# Patient Record
Sex: Female | Born: 1950 | Race: Asian | Hispanic: No | Smoking: Former smoker
Health system: Southern US, Community
[De-identification: ages and names within clinical notes are randomized; demographics above are authoritative.]

## PROBLEM LIST (undated history)

## (undated) DIAGNOSIS — I10 Essential (primary) hypertension: Secondary | ICD-10-CM

## (undated) DIAGNOSIS — F17210 Nicotine dependence, cigarettes, uncomplicated: Secondary | ICD-10-CM

## (undated) DIAGNOSIS — I25119 Atherosclerotic heart disease of native coronary artery with unspecified angina pectoris: Secondary | ICD-10-CM

## (undated) DIAGNOSIS — E785 Hyperlipidemia, unspecified: Secondary | ICD-10-CM

## (undated) HISTORY — DX: Hyperlipidemia, unspecified: E78.5

## (undated) HISTORY — DX: Nicotine dependence, cigarettes, uncomplicated: F17.210

## (undated) HISTORY — DX: Essential (primary) hypertension: I10

## (undated) HISTORY — DX: Atherosclerotic heart disease of native coronary artery with unspecified angina pectoris: I25.119

---

## 1999-05-01 ENCOUNTER — Other Ambulatory Visit: Admission: RE | Admit: 1999-05-01 | Discharge: 1999-05-01 | Payer: Self-pay | Admitting: Physician Assistant

## 2001-08-18 ENCOUNTER — Other Ambulatory Visit: Admission: RE | Admit: 2001-08-18 | Discharge: 2001-08-18 | Payer: Self-pay | Admitting: Obstetrics and Gynecology

## 2001-11-17 ENCOUNTER — Emergency Department (HOSPITAL_COMMUNITY): Admission: EM | Admit: 2001-11-17 | Discharge: 2001-11-17 | Payer: Self-pay | Admitting: Emergency Medicine

## 2001-11-17 ENCOUNTER — Encounter: Payer: Self-pay | Admitting: Emergency Medicine

## 2003-02-12 ENCOUNTER — Other Ambulatory Visit: Admission: RE | Admit: 2003-02-12 | Discharge: 2003-02-12 | Payer: Self-pay | Admitting: Obstetrics and Gynecology

## 2004-03-05 ENCOUNTER — Other Ambulatory Visit: Admission: RE | Admit: 2004-03-05 | Discharge: 2004-03-05 | Payer: Self-pay | Admitting: Obstetrics and Gynecology

## 2005-04-02 ENCOUNTER — Other Ambulatory Visit: Admission: RE | Admit: 2005-04-02 | Discharge: 2005-04-02 | Payer: Self-pay | Admitting: Obstetrics and Gynecology

## 2009-05-11 ENCOUNTER — Emergency Department (HOSPITAL_COMMUNITY): Admission: EM | Admit: 2009-05-11 | Discharge: 2009-05-11 | Payer: Self-pay | Admitting: Emergency Medicine

## 2010-06-19 LAB — CBC
HCT: 43 % (ref 36.0–46.0)
Hemoglobin: 13.7 g/dL (ref 12.0–15.0)
MCHC: 31.8 g/dL (ref 30.0–36.0)
MCV: 74.2 fL — ABNORMAL LOW (ref 78.0–100.0)
Platelets: 287 10*3/uL (ref 150–400)
RBC: 5.8 MIL/uL — ABNORMAL HIGH (ref 3.87–5.11)
RDW: 14.4 % (ref 11.5–15.5)
WBC: 11.6 10*3/uL — ABNORMAL HIGH (ref 4.0–10.5)

## 2010-06-19 LAB — CULTURE, BLOOD (ROUTINE X 2): Culture: NO GROWTH

## 2010-06-19 LAB — BASIC METABOLIC PANEL
BUN: 13 mg/dL (ref 6–23)
CO2: 24 mEq/L (ref 19–32)
Calcium: 9.5 mg/dL (ref 8.4–10.5)
Chloride: 106 mEq/L (ref 96–112)
Creatinine, Ser: 0.66 mg/dL (ref 0.4–1.2)
GFR calc Af Amer: >60 mL/min (ref >60)
GFR calc non Af Amer: >60 mL/min (ref >60)
Glucose, Bld: 143 mg/dL — ABNORMAL HIGH (ref 70–99)
Potassium: 4.3 mEq/L (ref 3.5–5.1)
Sodium: 138 mEq/L (ref 135–145)

## 2010-06-19 LAB — POCT CARDIAC MARKERS
CKMB, poc: <1 ng/mL — ABNORMAL LOW (ref 1.0–8.0)
Myoglobin, poc: 61.8 ng/mL (ref 12–200)
Troponin i, poc: <0.05 ng/mL (ref 0.00–0.09)

## 2010-06-19 LAB — DIFFERENTIAL
Eosinophils Absolute: 0.3 10*3/uL (ref 0.0–0.7)
Lymphocytes Relative: 15 % (ref 12–46)
Lymphs Abs: 1.7 10*3/uL (ref 0.7–4.0)
Monocytes Relative: 6 % (ref 3–12)
Neutrophils Relative %: 76 % (ref 43–77)

## 2012-07-30 ENCOUNTER — Emergency Department (HOSPITAL_COMMUNITY): Payer: 59

## 2012-07-30 ENCOUNTER — Emergency Department (HOSPITAL_COMMUNITY)
Admission: EM | Admit: 2012-07-30 | Discharge: 2012-07-30 | Disposition: A | Discharge status: Home / Self Care | Payer: 59 | Attending: Emergency Medicine | Admitting: Emergency Medicine

## 2012-07-30 ENCOUNTER — Encounter (HOSPITAL_COMMUNITY): Payer: Self-pay | Admitting: Emergency Medicine

## 2012-07-30 DIAGNOSIS — I10 Essential (primary) hypertension: Secondary | ICD-10-CM | POA: Insufficient documentation

## 2012-07-30 DIAGNOSIS — Y9389 Activity, other specified: Secondary | ICD-10-CM | POA: Diagnosis not present

## 2012-07-30 DIAGNOSIS — IMO0002 Reserved for concepts with insufficient information to code with codable children: Secondary | ICD-10-CM | POA: Diagnosis not present

## 2012-07-30 DIAGNOSIS — N644 Mastodynia: Secondary | ICD-10-CM

## 2012-07-30 DIAGNOSIS — R911 Solitary pulmonary nodule: Secondary | ICD-10-CM | POA: Diagnosis not present

## 2012-07-30 DIAGNOSIS — S298XXA Other specified injuries of thorax, initial encounter: Secondary | ICD-10-CM | POA: Diagnosis present

## 2012-07-30 DIAGNOSIS — Z79899 Other long term (current) drug therapy: Secondary | ICD-10-CM | POA: Diagnosis not present

## 2012-07-30 DIAGNOSIS — Y92410 Unspecified street and highway as the place of occurrence of the external cause: Secondary | ICD-10-CM | POA: Diagnosis not present

## 2012-07-30 LAB — COMPREHENSIVE METABOLIC PANEL
ALT: 17 U/L (ref 0–35)
AST: 24 U/L (ref 0–37)
Alkaline Phosphatase: 80 U/L (ref 39–117)
CO2: 26 mEq/L (ref 19–32)
Chloride: 102 mEq/L (ref 96–112)
GFR calc Af Amer: 78 mL/min — ABNORMAL LOW (ref >90)
GFR calc non Af Amer: 67 mL/min — ABNORMAL LOW (ref >90)
Glucose, Bld: 184 mg/dL — ABNORMAL HIGH (ref 70–99)
Potassium: 3.6 mEq/L (ref 3.5–5.1)
Sodium: 138 mEq/L (ref 135–145)

## 2012-07-30 LAB — CBC WITH DIFFERENTIAL/PLATELET
Basophils Absolute: 0.1 10*3/uL (ref 0.0–0.1)
Eosinophils Relative: 3 % (ref 0–5)
Lymphocytes Relative: 17 % (ref 12–46)
Lymphs Abs: 1.6 10*3/uL (ref 0.7–4.0)
MCV: 69.5 fL — ABNORMAL LOW (ref 78.0–100.0)
Neutro Abs: 6.8 10*3/uL (ref 1.7–7.7)
Neutrophils Relative %: 71 % (ref 43–77)
Platelets: 326 10*3/uL (ref 150–400)
RBC: 5.8 MIL/uL — ABNORMAL HIGH (ref 3.87–5.11)
RDW: 14.2 % (ref 11.5–15.5)
WBC: 9.5 10*3/uL (ref 4.0–10.5)

## 2012-07-30 LAB — TROPONIN I: Troponin I: <0.3 ng/mL (ref <0.30)

## 2012-07-30 MED ORDER — DIAZEPAM 5 MG PO TABS: 5.0000 mg | ORAL_TABLET | Freq: Four times a day (QID) | ORAL | Status: DC | PRN

## 2012-07-30 MED ORDER — OXYCODONE-ACETAMINOPHEN 5-325 MG PO TABS: 2.0000 | ORAL_TABLET | ORAL | Status: DC | PRN

## 2012-07-30 MED ORDER — DIAZEPAM 5 MG/ML IJ SOLN
5.0000 mg | Freq: Once | INTRAMUSCULAR | Status: AC
  Administered 2012-07-30: 5 mg via INTRAVENOUS
  Filled 2012-07-30: qty 2

## 2012-07-30 MED ORDER — IBUPROFEN 800 MG PO TABS: 800.0000 mg | ORAL_TABLET | Freq: Three times a day (TID) | ORAL | Status: DC

## 2012-07-30 MED ORDER — SODIUM CHLORIDE 0.9 % IV BOLUS (SEPSIS)
1000.0000 mL | Freq: Once | INTRAVENOUS | Status: AC
  Administered 2012-07-30: 1000 mL via INTRAVENOUS

## 2012-07-30 MED ORDER — HYDROMORPHONE HCL PF 1 MG/ML IJ SOLN
1.0000 mg | Freq: Once | INTRAMUSCULAR | Status: AC
  Administered 2012-07-30: 1 mg via INTRAVENOUS
  Filled 2012-07-30: qty 1

## 2012-07-30 MED ORDER — ONDANSETRON HCL 4 MG/2ML IJ SOLN
4.0000 mg | Freq: Once | INTRAMUSCULAR | Status: AC
  Administered 2012-07-30: 4 mg via INTRAVENOUS
  Filled 2012-07-30: qty 2

## 2012-07-30 MED ORDER — MORPHINE SULFATE 4 MG/ML IJ SOLN
4.0000 mg | Freq: Once | INTRAMUSCULAR | Status: AC
  Administered 2012-07-30: 4 mg via INTRAVENOUS
  Filled 2012-07-30: qty 1

## 2012-07-30 MED ORDER — IOHEXOL 350 MG/ML SOLN
100.0000 mL | Freq: Once | INTRAVENOUS | Status: AC | PRN
  Administered 2012-07-30: 100 mL via INTRAVENOUS
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
## 2012-07-30 NOTE — ED Notes (Signed)
Discharge instructions reviewed. Pt verbalized understanding.  

## 2012-08-23 ENCOUNTER — Emergency Department (HOSPITAL_COMMUNITY)
Admission: EM | Admit: 2012-08-23 | Discharge: 2012-08-23 | Disposition: A | Discharge status: Home / Self Care | Payer: 59 | Attending: Emergency Medicine | Admitting: Emergency Medicine

## 2012-08-23 ENCOUNTER — Emergency Department (HOSPITAL_COMMUNITY): Payer: 59

## 2012-08-23 ENCOUNTER — Encounter (HOSPITAL_COMMUNITY): Payer: Self-pay | Admitting: Emergency Medicine

## 2012-08-23 DIAGNOSIS — G8911 Acute pain due to trauma: Secondary | ICD-10-CM | POA: Insufficient documentation

## 2012-08-23 DIAGNOSIS — R0789 Other chest pain: Secondary | ICD-10-CM

## 2012-08-23 DIAGNOSIS — R071 Chest pain on breathing: Secondary | ICD-10-CM | POA: Insufficient documentation

## 2012-08-23 DIAGNOSIS — I10 Essential (primary) hypertension: Secondary | ICD-10-CM | POA: Insufficient documentation

## 2012-08-23 DIAGNOSIS — F17200 Nicotine dependence, unspecified, uncomplicated: Secondary | ICD-10-CM | POA: Insufficient documentation

## 2012-08-23 DIAGNOSIS — R079 Chest pain, unspecified: Secondary | ICD-10-CM | POA: Diagnosis present

## 2012-08-23 DIAGNOSIS — Z79899 Other long term (current) drug therapy: Secondary | ICD-10-CM | POA: Insufficient documentation

## 2012-08-23 LAB — POCT I-STAT TROPONIN I: Troponin i, poc: 0 ng/mL (ref 0.00–0.08)

## 2012-08-23 MED ORDER — DICLOFENAC SODIUM 75 MG PO TBEC: 75.0000 mg | DELAYED_RELEASE_TABLET | Freq: Two times a day (BID) | ORAL | Status: DC

## 2012-08-23 MED ORDER — TRAMADOL HCL 50 MG PO TABS: 50.0000 mg | ORAL_TABLET | Freq: Four times a day (QID) | ORAL | Status: DC | PRN

## 2012-08-23 MED ORDER — NITROGLYCERIN 0.4 MG SL SUBL: 0.4000 mg | SUBLINGUAL_TABLET | SUBLINGUAL | Status: DC | PRN

## 2012-08-23 MED ORDER — ASPIRIN 325 MG PO TABS: 325.0000 mg | ORAL_TABLET | ORAL | Status: DC

## 2012-08-23 NOTE — ED Notes (Signed)
Patient stated she takes bp meds at home.   She advised "it goes up when I get nervous".
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
## 2014-04-23 ENCOUNTER — Ambulatory Visit: Payer: Self-pay | Admitting: Internal Medicine

## 2014-04-23 NOTE — Progress Notes (Signed)
Patient ID: Anthoney HaradaLetecia Georg, female   DOB: 12-30-1950, 64 y.o.   MRN: [id number]  [name]  O      S  H  O  W

## 2017-08-27 DIAGNOSIS — I25119 Atherosclerotic heart disease of native coronary artery with unspecified angina pectoris: Secondary | ICD-10-CM

## 2017-08-27 HISTORY — DX: Atherosclerotic heart disease of native coronary artery with unspecified angina pectoris: I25.119

## 2017-09-12 ENCOUNTER — Other Ambulatory Visit: Payer: Self-pay

## 2017-09-12 ENCOUNTER — Emergency Department (HOSPITAL_COMMUNITY): Payer: 59

## 2017-09-12 ENCOUNTER — Encounter (HOSPITAL_COMMUNITY): Payer: Self-pay | Admitting: *Deleted

## 2017-09-12 ENCOUNTER — Inpatient Hospital Stay (HOSPITAL_COMMUNITY)
Admission: EM | Admit: 2017-09-12 | Discharge: 2017-09-15 | DRG: 247 | Disposition: A | Discharge status: Home / Self Care | Payer: 59 | Attending: Family Medicine | Admitting: Family Medicine

## 2017-09-12 DIAGNOSIS — I2510 Atherosclerotic heart disease of native coronary artery without angina pectoris: Secondary | ICD-10-CM | POA: Diagnosis present

## 2017-09-12 DIAGNOSIS — W208XXA Other cause of strike by thrown, projected or falling object, initial encounter: Secondary | ICD-10-CM | POA: Diagnosis present

## 2017-09-12 DIAGNOSIS — I25119 Atherosclerotic heart disease of native coronary artery with unspecified angina pectoris: Secondary | ICD-10-CM | POA: Clinically undetermined

## 2017-09-12 DIAGNOSIS — Y9289 Other specified places as the place of occurrence of the external cause: Secondary | ICD-10-CM

## 2017-09-12 DIAGNOSIS — I200 Unstable angina: Secondary | ICD-10-CM | POA: Diagnosis present

## 2017-09-12 DIAGNOSIS — I161 Hypertensive emergency: Secondary | ICD-10-CM | POA: Diagnosis present

## 2017-09-12 DIAGNOSIS — R079 Chest pain, unspecified: Secondary | ICD-10-CM | POA: Diagnosis present

## 2017-09-12 DIAGNOSIS — I10 Essential (primary) hypertension: Secondary | ICD-10-CM | POA: Diagnosis present

## 2017-09-12 DIAGNOSIS — E7849 Other hyperlipidemia: Secondary | ICD-10-CM | POA: Diagnosis present

## 2017-09-12 DIAGNOSIS — Z87891 Personal history of nicotine dependence: Secondary | ICD-10-CM | POA: Diagnosis present

## 2017-09-12 DIAGNOSIS — E785 Hyperlipidemia, unspecified: Secondary | ICD-10-CM | POA: Diagnosis present

## 2017-09-12 DIAGNOSIS — Z955 Presence of coronary angioplasty implant and graft: Secondary | ICD-10-CM

## 2017-09-12 DIAGNOSIS — R9439 Abnormal result of other cardiovascular function study: Secondary | ICD-10-CM | POA: Clinically undetermined

## 2017-09-12 DIAGNOSIS — F17210 Nicotine dependence, cigarettes, uncomplicated: Secondary | ICD-10-CM | POA: Diagnosis present

## 2017-09-12 DIAGNOSIS — I25110 Atherosclerotic heart disease of native coronary artery with unstable angina pectoris: Secondary | ICD-10-CM | POA: Diagnosis not present

## 2017-09-12 DIAGNOSIS — I4510 Unspecified right bundle-branch block: Secondary | ICD-10-CM | POA: Diagnosis present

## 2017-09-12 DIAGNOSIS — R911 Solitary pulmonary nodule: Secondary | ICD-10-CM | POA: Diagnosis present

## 2017-09-12 DIAGNOSIS — S0990XA Unspecified injury of head, initial encounter: Secondary | ICD-10-CM | POA: Diagnosis present

## 2017-09-12 LAB — BASIC METABOLIC PANEL
ANION GAP: 11 (ref 5–15)
BUN: 20 mg/dL (ref 6–20)
CHLORIDE: 102 mmol/L (ref 101–111)
CO2: 22 mmol/L (ref 22–32)
Calcium: 10.3 mg/dL (ref 8.9–10.3)
Creatinine, Ser: 0.87 mg/dL (ref 0.44–1.00)
GFR calc Af Amer: >60 mL/min (ref >60)
GFR calc non Af Amer: >60 mL/min (ref >60)
Glucose, Bld: 157 mg/dL — ABNORMAL HIGH (ref 65–99)
POTASSIUM: 4 mmol/L (ref 3.5–5.1)
SODIUM: 135 mmol/L (ref 135–145)

## 2017-09-12 LAB — CBC
HEMATOCRIT: 44.6 % (ref 36.0–46.0)
HEMOGLOBIN: 13.8 g/dL (ref 12.0–15.0)
MCH: 22.7 pg — ABNORMAL LOW (ref 26.0–34.0)
MCHC: 30.9 g/dL (ref 30.0–36.0)
MCV: 73.4 fL — AB (ref 78.0–100.0)
Platelets: 339 10*3/uL (ref 150–400)
RBC: 6.08 MIL/uL — AB (ref 3.87–5.11)
RDW: 14.1 % (ref 11.5–15.5)
WBC: 11.5 10*3/uL — AB (ref 4.0–10.5)

## 2017-09-12 LAB — I-STAT TROPONIN, ED
TROPONIN I, POC: 0.01 ng/mL (ref 0.00–0.08)
Troponin i, poc: 0 ng/mL (ref 0.00–0.08)

## 2017-09-12 LAB — I-STAT VENOUS BLOOD GAS, ED
Bicarbonate: 25.1 mmol/L (ref 20.0–28.0)
O2 Saturation: 81 %
PH VEN: 7.376 (ref 7.250–7.430)
TCO2: 26 mmol/L (ref 22–32)
pCO2, Ven: 42.7 mmHg — ABNORMAL LOW (ref 44.0–60.0)
pO2, Ven: 47 mmHg — ABNORMAL HIGH (ref 32.0–45.0)

## 2017-09-12 LAB — BRAIN NATRIURETIC PEPTIDE: B NATRIURETIC PEPTIDE 5: 50 pg/mL (ref 0.0–100.0)

## 2017-09-12 LAB — I-STAT CHEM 8, ED
BUN: 33 mg/dL — ABNORMAL HIGH (ref 6–20)
BUN: 29 mg/dL — ABNORMAL HIGH (ref 6–20)
CALCIUM ION: 1.31 mmol/L (ref 1.15–1.40)
Calcium, Ion: 1.13 mmol/L — ABNORMAL LOW (ref 1.15–1.40)
Chloride: 106 mmol/L (ref 101–111)
Chloride: 104 mmol/L (ref 101–111)
Creatinine, Ser: 0.8 mg/dL (ref 0.44–1.00)
Creatinine, Ser: 0.8 mg/dL (ref 0.44–1.00)
Glucose, Bld: 156 mg/dL — ABNORMAL HIGH (ref 65–99)
Glucose, Bld: 160 mg/dL — ABNORMAL HIGH (ref 65–99)
HCT: 47 % — ABNORMAL HIGH (ref 36.0–46.0)
HEMATOCRIT: 48 % — AB (ref 36.0–46.0)
HEMOGLOBIN: 16 g/dL — AB (ref 12.0–15.0)
HEMOGLOBIN: 16.3 g/dL — AB (ref 12.0–15.0)
POTASSIUM: 8 mmol/L — AB (ref 3.5–5.1)
Potassium: 4.7 mmol/L (ref 3.5–5.1)
SODIUM: 132 mmol/L — AB (ref 135–145)
SODIUM: 137 mmol/L (ref 135–145)
TCO2: 23 mmol/L (ref 22–32)
TCO2: 25 mmol/L (ref 22–32)

## 2017-09-12 LAB — CBG MONITORING, ED: GLUCOSE-CAPILLARY: 164 mg/dL — AB (ref 65–99)

## 2017-09-12 MED ORDER — SODIUM CHLORIDE 0.9 % IV SOLN
1.0000 g | Freq: Once | INTRAVENOUS | Status: DC
  Filled 2017-09-12: qty 10

## 2017-09-12 MED ORDER — IOPAMIDOL (ISOVUE-370) INJECTION 76%
100.0000 mL | Freq: Once | INTRAVENOUS | Status: AC | PRN
  Administered 2017-09-12: 100 mL via INTRAVENOUS

## 2017-09-12 MED ORDER — MORPHINE SULFATE (PF) 4 MG/ML IV SOLN
4.0000 mg | Freq: Once | INTRAVENOUS | Status: AC
  Administered 2017-09-12: 4 mg via INTRAVENOUS
  Filled 2017-09-12: qty 1

## 2017-09-12 MED ORDER — NITROGLYCERIN IN D5W 200-5 MCG/ML-% IV SOLN
0.0000 ug/min | Freq: Once | INTRAVENOUS | Status: AC
  Administered 2017-09-12: 50 ug/min via INTRAVENOUS
  Filled 2017-09-12: qty 250

## 2017-09-12 MED ORDER — IOPAMIDOL (ISOVUE-370) INJECTION 76%
INTRAVENOUS | Status: AC
  Filled 2017-09-12: qty 100

## 2017-09-12 MED ORDER — FUROSEMIDE 10 MG/ML IJ SOLN
20.0000 mg | Freq: Once | INTRAMUSCULAR | Status: AC
  Administered 2017-09-12: 20 mg via INTRAVENOUS
  Filled 2017-09-12: qty 2

## 2017-09-12 MED ORDER — NITROGLYCERIN 0.4 MG SL SUBL
0.4000 mg | SUBLINGUAL_TABLET | SUBLINGUAL | Status: DC | PRN
  Administered 2017-09-12 (×2): 0.4 mg via SUBLINGUAL
  Filled 2017-09-12: qty 1

## 2017-09-12 NOTE — ED Provider Notes (Signed)
[facility] EMERGENCY DEPARTMENT Provider Note   CSN: [id number] Arrival date & time: 09/12/17  1416  History   Chief Complaint No chief complaint on file.   HPI Ashley Moran is a 67 y.o. female.  The history is provided by the patient.    67 yo F with PMHx of HTN, tobacco abuse who presents with chest pain since waking up at 0400 this AM. Initially described pain as sharp, midsternal, radiating to neck. Took 6 ASA 81mg  at work with no improvement in pain. EMS gave 1 SL ntg with improvement in BP from 220s -> 110s systolic with near resolution of pain. At this time, describes pain as severe, heavy, mid-sternal, radiating to abd. Accompanied by dyspnea, which is worse with exertion. No similar symptoms in the past. Denies recent fevers or worsening cough from baseline. Denies leg swelling. Denies h/o PE/DVT. Denies cardiac history.   Past Medical History:  Diagnosis Date  . Hypertension     Patient Active Problem List   Diagnosis Date Noted  . Chest pain 09/12/2017    History reviewed. No pertinent surgical history.   OB History   None      Home Medications    Prior to Admission medications   Medication Sig Start Date End Date Taking? Authorizing Provider  Aspirin-Caffeine (BAYER BACK & BODY) 500-32.5 MG TABS Take 1 tablet by mouth See admin instructions. Take 1 tablet by mouth in the morning and 2-3 times daily, additionally, as needed for pain   Yes [provider]  lisinopril (PRINIVIL,ZESTRIL) 10 MG tablet Take 10 mg by mouth every evening.   Yes [provider]  Multiple Vitamins-Minerals (HAIR SKIN AND NAILS FORMULA) TABS Take 1 tablet by mouth 2 (two) times daily.   Yes [provider]  diclofenac (VOLTAREN) 75 MG EC tablet Take 1 tablet (75 mg total) by mouth 2 (two) times daily. Patient not taking: Reported on 09/12/2017 08/23/12   Gilda Crease, MD  traMADol (ULTRAM) 50 MG tablet Take 1 tablet (50 mg total)  by mouth every 6 (six) hours as needed for pain. Patient not taking: Reported on 09/12/2017 08/23/12   Gilda Crease, MD    Family History Family History  Problem Relation Age of Onset  . Other Mother        irregular heartbeat not on any medication    Social History Social History   Tobacco Use  . Smoking status: Current Every Day Smoker    Packs/day: 0.50  . Smokeless tobacco: Never Used  Substance Use Topics  . Alcohol use: No  . Drug use: No     Allergies   Patient has no known allergies.   Review of Systems Review of Systems  Constitutional: Negative for chills and fever.  HENT: Negative for ear pain and sore throat.   Eyes: Negative for pain and visual disturbance.  Respiratory: Negative for cough and shortness of breath.   Cardiovascular: Positive for chest pain. Negative for palpitations and leg swelling.  Gastrointestinal: Negative for abdominal pain and vomiting.  Genitourinary: Negative for dysuria and hematuria.  Musculoskeletal: Negative for arthralgias and back pain.  Skin: Negative for color change and rash.  Neurological: Negative for seizures and syncope.  All other systems reviewed and are negative.    Physical Exam Updated Vital Signs BP 114/69   Pulse (!) 101   Resp 18   Ht 5' (1.524 m)   Wt 62.6 kg (138 lb)   SpO2 93%  BMI 26.95 kg/m   Physical Exam  Constitutional: She is oriented to person, place, and time. She appears well-developed and well-nourished. No distress.  HENT:  Head: Normocephalic and atraumatic.  Mouth/Throat: Oropharynx is clear and moist.  Eyes: Conjunctivae and EOM are normal.  Neck: Neck supple.  Cardiovascular: Regular rhythm, normal heart sounds and intact distal pulses. Tachycardia present. Exam reveals no friction rub.  No murmur heard. Pulmonary/Chest: Effort normal. Tachypnea noted. No respiratory distress. She has rales (throughout). She exhibits no tenderness.  Abdominal: Soft. She exhibits no  distension. There is no tenderness. There is no guarding.  Musculoskeletal: She exhibits no edema (no BLE edema).  Neurological: She is alert and oriented to person, place, and time.  Skin: Skin is warm and dry.  Psychiatric: She has a normal mood and affect.  Nursing note and vitals reviewed.    ED Treatments / Results  Labs (all labs ordered are listed, but only abnormal results are displayed) Labs Reviewed  BASIC METABOLIC PANEL - Abnormal; Notable for the following components:      Result Value   Glucose, Bld 157 (*)    All other components within normal limits  CBC - Abnormal; Notable for the following components:   WBC 11.5 (*)    RBC 6.08 (*)    MCV 73.4 (*)    MCH 22.7 (*)    All other components within normal limits  I-STAT CHEM 8, ED - Abnormal; Notable for the following components:   Sodium 132 (*)    Potassium 8.0 (*)    BUN 33 (*)    Glucose, Bld 156 (*)    Calcium, Ion 1.13 (*)    Hemoglobin 16.3 (*)    HCT 48.0 (*)    All other components within normal limits  I-STAT CHEM 8, ED - Abnormal; Notable for the following components:   BUN 29 (*)    Glucose, Bld 160 (*)    Hemoglobin 16.0 (*)    HCT 47.0 (*)    All other components within normal limits  I-STAT VENOUS BLOOD GAS, ED - Abnormal; Notable for the following components:   pCO2, Ven 42.7 (*)    pO2, Ven 47.0 (*)    All other components within normal limits  CBG MONITORING, ED - Abnormal; Notable for the following components:   Glucose-Capillary 164 (*)    All other components within normal limits  BRAIN NATRIURETIC PEPTIDE  BLOOD GAS, VENOUS  TROPONIN I  TROPONIN I  TROPONIN I  I-STAT TROPONIN, ED  I-STAT TROPONIN, ED    EKG EKG Interpretation  Date/Time:  Monday September 12 2017 17:16:33 EDT Ventricular Rate:  94 PR Interval:    QRS Duration: 112 QT Interval:  355 QTC Calculation: 444 R Axis:   65 Text Interpretation:  Sinus rhythm Nonspecific ST abnormality No significant change since  last tracing Confirmed by Cathren Laine ([id number]) on 09/12/2017 10:13:18 PM   Radiology Dg Chest Portable 1 View  Result Date: 09/12/2017 CLINICAL DATA:  Severe shortness of breath with mid chest pain today. EXAM: PORTABLE CHEST 1 VIEW COMPARISON:  Radiographs 08/23/2012.  CT 07/30/2012. FINDINGS: 1441 hours. The heart size and mediastinal contours are stable. There is aortic atherosclerosis. There is mild pulmonary edema without confluent airspace opacity, pleural effusion or pneumothorax. No acute osseous findings are seen. Multiple telemetry leads overlie the chest. IMPRESSION: Mild congestive heart failure. Electronically Signed   By: Carey Bullocks M.D.   On: 09/12/2017 14:50   Ct Angio Chest/abd/pel For  Dissection W And/or W/wo  Result Date: 09/12/2017 CLINICAL DATA:  67 year old with chest pain, cardiac etiology suspected. EXAM: CT ANGIOGRAPHY CHEST, ABDOMEN AND PELVIS TECHNIQUE: Multidetector CT imaging through the chest, abdomen and pelvis was performed using the standard protocol during bolus administration of intravenous contrast. Multiplanar reconstructed images and MIPs were obtained and reviewed to evaluate the vascular anatomy. CONTRAST:  100mL ISOVUE-370 IOPAMIDOL (ISOVUE-370) INJECTION 76% COMPARISON:  Recent CTA examination 07/30/2012 FINDINGS: CTA CHEST FINDINGS Cardiovascular: Extensive coronary artery calcifications. No evidence for aortic dissection or intramural hematoma. Normal caliber of the thoracic aorta. Great vessels are patent. Atherosclerotic plaque at the aortic arch is similar to the previous examination. Main pulmonary arteries are patent without large pulmonary emboli. Mediastinum/Nodes: No significant mediastinal or hilar lymphadenopathy. No axillary lymphadenopathy. Lungs/Pleura: Trachea and mainstem bronchi are patent. No pleural effusions. Stable 5 mm pleural-based nodule in the anterior right middle lobe on sequence 7, image 60. Again noted is a mosaic attenuation  pattern throughout the lungs. In addition, there is probably atelectasis in the lower lobes. No large areas of airspace disease or lung consolidation. Musculoskeletal: No acute bone abnormality. Review of the MIP images confirms the above findings. CTA ABDOMEN AND PELVIS FINDINGS VASCULAR Aorta: Atherosclerotic disease in the abdominal aorta without aneurysm or dissection. Celiac: Mild narrowing at the origin. Celiac trunk and main branch vessels are patent. SMA: SMA is patent with mild atherosclerotic disease. No significant stenosis. Renals: Mild narrowing at the origin of the right renal artery. Atherosclerotic plaque at origin of the left renal artery without significant stenosis. IMA: IMA is patent. Inflow: Common, external and internal iliac arteries are patent bilaterally. Mild disease in the common femoral arteries bilaterally. The proximal right femoral arteries are patent. There is severe narrowing in the proximal left SFA. Proximal left deep femoral arteries are patent. Veins: No obvious venous abnormality within the limitations of this arterial phase study. Review of the MIP images confirms the above findings. NON-VASCULAR Hepatobiliary: There appears to be a fold or phrygian cap at the gallbladder fundus containing a chronic stone. No evidence for gallbladder distension or inflammation. Normal appearance of the liver without biliary dilatation. Pancreas: Unremarkable. No pancreatic ductal dilatation or surrounding inflammatory changes. Spleen: Normal in size without focal abnormality. Adrenals/Urinary Tract: Adrenal glands are normal. Normal appearance of the kidneys without hydronephrosis or suspicious lesions. Mild distention of the urinary bladder. Stomach/Bowel: Stomach is within normal limits. Appendix appears normal. No evidence of bowel wall thickening, distention, or inflammatory changes. Lymphatic: No significant lymph node enlargement in the abdomen or pelvis. Reproductive: Uterus and  bilateral adnexa are unremarkable. Other: No ascites.  Negative for free air. Musculoskeletal: No acute bone abnormality. Review of the MIP images confirms the above findings. IMPRESSION: Vascular: No acute aortic abnormality. Specifically, no evidence for an aortic aneurysm or dissection. Extensive coronary artery calcifications. 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 Nonvascular: Persistent mosaic attenuation pattern in the lungs. Findings are nonspecific but could be related to chronic air trapping. No acute abnormality in the abdomen or pelvis. 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 No evidence for gallbladder inflammation. Electronically Signed   By: Richarda OverlieAdam  Henn M.D.   On: 09/12/2017 17:16    Procedures Procedures (including critical care time)  Medications Ordered in ED Medications  nitroGLYCERIN (NITROSTAT) SL tablet 0.4 mg (0.4 mg Sublingual Given 09/12/17 1443)  iopamidol (ISOVUE-370) 76 % injection (has no administration in time range)  morphine 4 MG/ML injection 4 mg (4 mg Intravenous Given 09/12/17  1458)  nitroGLYCERIN 50 mg in dextrose 5 % 250 mL (0.2 mg/mL) infusion (60 mcg/min Intravenous Rate/Dose Change 09/12/17 1607)  iopamidol (ISOVUE-370) 76 % injection 100 mL (100 mLs Intravenous Contrast Given 09/12/17 1626)  furosemide (LASIX) injection 20 mg (20 mg Intravenous Given 09/12/17 1719)     Initial Impression / Assessment and Plan / ED Course  I have reviewed the triage vital signs and the nursing notes.  Pertinent labs & imaging results that were available during my care of the patient were reviewed by me and considered in my medical decision making (see chart for details).     Ashley Moran is a 67 y.o. female with PMHx of HTN, tobacco abuse who p/w chest pain since awakening this AM. Reviewed and confirmed nursing documentation for past medical history, family history, social history. VS afebrile. Exam remarkable for crackles throughout,  tachypnea, no resp disterss. Ddx includes HTN emergency with possible flash pulmonary edema, ACS, considering PE, dissection given radiation down abd. New O2 requirement of 4L.   EKG with new RBBB, subtle scattered ST changes, nonspecific. Started on nitro gtt with improvement in symptoms and resolution of chest pain. Trop neg x 2. VBG wnl. Initially erroneous I-stat chem 8 with K of 8, repeated wnl. BMP unremarkable. CBC with leukocytosis 12.5. BNP 50. CXR with mild pulmonary edema with no confluent airspace opacity to suggest PNA. Given lasix 20mg  IV. CTA chest/abd/pelvis with no aortic aneurysm or dissection, no PE. Persistent mosaic attenuation seen on prior imaging.   Old records reviewed. Labs reviewed by me and used in the medical decision making.  Imaging viewed and interpreted by me and used in the medical decision making (formal interpretation from radiologist). EKG reviewed by me and used in the medical decision making. Admitted to family medicine.    Final Clinical Impressions(s) / ED Diagnoses   Final diagnoses:  Chest pain, unspecified type  Hypertensive emergency    ED Discharge Orders    None       Diannia Ruder, MD 09/13/17 1610    Cathren Laine, MD 09/15/17 [phone]

## 2017-09-12 NOTE — ED Notes (Signed)
texted MD to ask if patient can eat.

## 2017-09-12 NOTE — ED Notes (Signed)
ED Provider at bedside. 

## 2017-09-12 NOTE — ED Notes (Signed)
Patient transported to CT 

## 2017-09-12 NOTE — ED Triage Notes (Signed)
PT here via [facility] from work. Wakes up at 0430 and noted her chest hurt.  Took 6 baby asa while at work.  Pt describes sternal pain as stabbing that initially radiated to neck.  Given 1 nitro en-route that dropped pressure from 220/100 to 117/60 and almost resolved pain. However, pain quickly resumed and pt is crying out in pain.  ekg showed incomplete R bundle.

## 2017-09-12 NOTE — H&P (Signed)
Family Medicine Teaching [facility] Admission History and Physical Service Pager: [phone]  Patient name: Ashley HainesLeticia Aloia Medical record number: [id number] Date of birth: Jun 04, 1950 Age: 67 y.o. Gender: female  Primary Care Provider: Kaleen MaskElkins, Wilson Oliver, MD Consultants: none Code Status: Full  Chief Complaint: chest pain  Assessment and Plan: Ashley HainesLeticia Coey is a 67 y.o. female presenting with chest pain. PMH is significant for HTN, tobacco abuse.  Chest pain. Requiring nitro gtt. HEART score 5. EKG showing new partial RBB. Trop poc 0.01.  Nonreproducible on exam.  Does have a new oxygen requirement that per chart review was started for desaturation to 89%.  Chest x-ray with mild pulmonary edema but appears euvolemic on exam, BNP 50, so unlikely CHF.  CTA chest neg for PE or dissection but notable for persistent mosaic attenuation pattern in the lungs  that was also previously seen in 2014  -Place in observation, attending Dr. Jennette KettleNeal -Monitor on telemetry -Trend troponins -A.m. EKG -Echo -Continue nitro gtt -cards consult in am, appreciate recommendations  Headache with reported head trauma. Patient reported that a cabinet fell on her head during ambulance ride. No focal deficits and no scalp tenderness.  Headache could also be due to the nitro drip - CT head for completeness.  Hypertension.  BP 114/69. -Continue home lisinopril 10 mg daily  Tobacco abuse.  Significant smoking history, likely has some component of undiagnosed COPD -Nicotine patch -PFTs as outpatient, anticipate will likely need controller inhaler  Pulmonary nodule.  Seen on CTA chest,  5 mm nodule on anterior right middle lobe stable from 2014. - Follow up as outpatient   FEN/GI: Heart healthy Prophylaxis: lovenox  Disposition: place in observation  History of Present Illness:  Ashley Moran is a 67 y.o. female presenting with chest pain.  Patient is historian.  She has 2 close friends at bedside that  are her coworkers  She states that she was awake at 4 AM which is her usual morning wake-up time when she had sudden onset chest pain, right-sided/central, constant, radiating to her mid epigastric region, felt like a heaviness, rated 10 out of 10.  She went to work and her coworkers thought that she appeared short of breath but they were uncertain if that was secondary to pain.  They called EMS, patient was given aspirin and nitroglycerin which relieved her chest pain.  She denies any recent illnesses, fever, chills, palpitations, orthopnea, abdominal pain, nausea, vomiting, dysuria, diarrhea, constipation.   Review Of Systems: Per HPI with the following additions:   Review of Systems  Constitutional: Negative for chills, diaphoresis, fever and malaise/fatigue.  Respiratory: Negative for sputum production, shortness of breath and wheezing.   Cardiovascular: Positive for chest pain. Negative for palpitations.  Gastrointestinal: Positive for heartburn and nausea. Negative for abdominal pain, blood in stool, constipation, diarrhea, melena and vomiting.  Genitourinary: Negative for dysuria, frequency and urgency.  Musculoskeletal: Negative for back pain.  Neurological: Negative for dizziness, weakness and headaches.    There are no active problems to display for this patient.   Past Medical History: Past Medical History:  Diagnosis Date  . Hypertension     Past Surgical History: History reviewed. No pertinent surgical history.  Social History: Social History   Tobacco Use  . Smoking status: Current Every Day Smoker    Packs/day: 0.50  . Smokeless tobacco: Never Used  Substance Use Topics  . Alcohol use: No  . Drug use: No   Additional social history: lives at home alone. 0.5 ppd  for 40 years. 1 drink per week on average.  Please also refer to relevant sections of EMR.  Family History: Family History  Problem Relation Age of Onset  . Other Mother        irregular heartbeat  not on any medication    Allergies and Medications: No Known Allergies No current facility-administered medications on file prior to encounter.    Current Outpatient Medications on File Prior to Encounter  Medication Sig Dispense Refill  . Aspirin-Caffeine (BAYER BACK & BODY) 500-32.5 MG TABS Take 1 tablet by mouth See admin instructions. Take 1 tablet by mouth in the morning and 2-3 times daily, additionally, as needed for pain    . lisinopril (PRINIVIL,ZESTRIL) 10 MG tablet Take 10 mg by mouth every evening.    . Multiple Vitamins-Minerals (HAIR SKIN AND NAILS FORMULA) TABS Take 1 tablet by mouth 2 (two) times daily.    . diclofenac (VOLTAREN) 75 MG EC tablet Take 1 tablet (75 mg total) by mouth 2 (two) times daily. (Patient not taking: Reported on 09/12/2017) 20 tablet 0  . traMADol (ULTRAM) 50 MG tablet Take 1 tablet (50 mg total) by mouth every 6 (six) hours as needed for pain. (Patient not taking: Reported on 09/12/2017) 15 tablet 0    Objective: BP 115/65   Pulse 100   Resp (!) 31   Ht 5' (1.524 m)   Wt 62.6 kg (138 lb)   SpO2 94%   BMI 26.95 kg/m  Exam: General: Lying in bed comfortably, in no acute distress Eyes: Pupils reactive bilaterally, extraocular movements intact ENTM: Moist mucous membranes Neck: Supple, normal range of motion Cardiovascular: Regular rate and rhythm, normal S1-S2, no murmurs Respiratory: Coarse crackles at bilateral lung fields, clear in upper lung fields, no wheezes.  Normal effort with nasal cannula in place Gastrointestinal: Soft, nontender, nondistended, positive bowel sounds MSK: Moving all limbs equally, no lower extremity edema Derm: Warm and dry Neuro: Alert and awake, oriented, grossly normal Psych: Appropriate affect  Labs and Imaging: CBC BMET  Recent Labs  Lab 09/12/17 1454  09/12/17 1529  WBC 11.5*  --   --   HGB 13.8   < > 16.0*  HCT 44.6   < > 47.0*  PLT 339  --   --    < > = values in this interval not displayed.    Recent Labs  Lab 09/12/17 1454  09/12/17 1529  NA 135   < > 137  K 4.0   < > 4.7  CL 102   < > 104  CO2 22  --   --   BUN 20   < > 29*  CREATININE 0.87   < > 0.80  GLUCOSE 157*   < > 160*  CALCIUM 10.3  --   --    < > = values in this interval not displayed.     BNP    Component Value Date/Time   BNP 50.0 09/12/2017 1454    Dg Chest Portable 1 View  Result Date: 09/12/2017 CLINICAL DATA:  Severe shortness of breath with mid chest pain today. EXAM: PORTABLE CHEST 1 VIEW COMPARISON:  Radiographs 08/23/2012.  CT 07/30/2012. FINDINGS: 1441 hours. The heart size and mediastinal contours are stable. There is aortic atherosclerosis. There is mild pulmonary edema without confluent airspace opacity, pleural effusion or pneumothorax. No acute osseous findings are seen. Multiple telemetry leads overlie the chest. IMPRESSION: Mild congestive heart failure. Electronically Signed   By: Hilarie Fredrickson.D.  On: 09/12/2017 14:50   Ct Angio Chest/abd/pel For Dissection W And/or W/wo  Result Date: 09/12/2017 CLINICAL DATA:  67 year old with chest pain, cardiac etiology suspected. EXAM: CT ANGIOGRAPHY CHEST, ABDOMEN AND PELVIS TECHNIQUE: Multidetector CT imaging through the chest, abdomen and pelvis was performed using the standard protocol during bolus administration of intravenous contrast. Multiplanar reconstructed images and MIPs were obtained and reviewed to evaluate the vascular anatomy. CONTRAST:  ISOVUE-370 IOPAMIDOL (ISOVUE-370) INJECTION 76% COMPARISON:  Recent CTA examination 07/30/2012 FINDINGS: CTA CHEST FINDINGS Cardiovascular: Extensive coronary artery calcifications. No evidence for aortic dissection or intramural hematoma. Normal caliber of the thoracic aorta. Great vessels are patent. Atherosclerotic plaque at the aortic arch is similar to the previous examination. Main pulmonary arteries are patent without large pulmonary emboli. Mediastinum/Nodes: No significant mediastinal or  hilar lymphadenopathy. No axillary lymphadenopathy. Lungs/Pleura: Trachea and mainstem bronchi are patent. No pleural effusions. Stable 5 mm pleural-based nodule in the anterior right middle lobe on sequence 7, image 60. Again noted is a mosaic attenuation pattern throughout the lungs. In addition, there is probably atelectasis in the lower lobes. No large areas of airspace disease or lung consolidation. Musculoskeletal: No acute bone abnormality. Review of the MIP images confirms the above findings. CTA ABDOMEN AND PELVIS FINDINGS VASCULAR Aorta: Atherosclerotic disease in the abdominal aorta without aneurysm or dissection. Celiac: Mild narrowing at the origin. Celiac trunk and main branch vessels are patent. SMA: SMA is patent with mild atherosclerotic disease. No significant stenosis. Renals: Mild narrowing at the origin of the right renal artery. Atherosclerotic plaque at origin of the left renal artery without significant stenosis. IMA: IMA is patent. Inflow: Common, external and internal iliac arteries are patent bilaterally. Mild disease in the common femoral arteries bilaterally. The proximal right femoral arteries are patent. There is severe narrowing in the proximal left SFA. Proximal left deep femoral arteries are patent. Veins: No obvious venous abnormality within the limitations of this arterial phase study. Review of the MIP images confirms the above findings. NON-VASCULAR Hepatobiliary: There appears to be a fold or phrygian cap at the gallbladder fundus containing a chronic stone. No evidence for gallbladder distension or inflammation. Normal appearance of the liver without biliary dilatation. Pancreas: Unremarkable. No pancreatic ductal dilatation or surrounding inflammatory changes. Spleen: Normal in size without focal abnormality. Adrenals/Urinary Tract: Adrenal glands are normal. Normal appearance of the kidneys without hydronephrosis or suspicious lesions. Mild distention of the urinary bladder.  Stomach/Bowel: Stomach is within normal limits. Appendix appears normal. No evidence of bowel wall thickening, distention, or inflammatory changes. Lymphatic: No significant lymph node enlargement in the abdomen or pelvis. Reproductive: Uterus and bilateral adnexa are unremarkable. Other: No ascites.  Negative for free air. Musculoskeletal: No acute bone abnormality. Review of the MIP images confirms the above findings. IMPRESSION: Vascular: No acute aortic abnormality. Specifically, no evidence for an aortic aneurysm or dissection. Extensive coronary artery calcifications. 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 Nonvascular: Persistent mosaic attenuation pattern in the lungs. Findings are nonspecific but could be related to chronic air trapping. No acute abnormality in the abdomen or pelvis. 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 No evidence for gallbladder inflammation. Electronically Signed   By: Richarda Overlie M.D.   On: 09/12/2017 17:16    Leland Her, DO 09/12/2017, 6:14 PM PGY-2, [facility] FPTS Intern pager: [phone], text pages welcome

## 2017-09-12 NOTE — ED Notes (Signed)
Paged admitting/Family practice to Pattricia BossAnnie, [place]

## 2017-09-12 NOTE — ED Notes (Addendum)
MD Pricilla Holmucker made aware of patient's potassium level of 8 from I-stat chem 8.

## 2017-09-12 NOTE — ED Notes (Signed)
Pt O2 sat 89% on 2L O2. Pt placed on 4L O2 [facility]-C order.

## 2017-09-13 ENCOUNTER — Encounter (HOSPITAL_COMMUNITY)
Admission: EM | Disposition: A | Discharge status: Home / Self Care | Payer: Self-pay | Source: Home / Self Care | Attending: Family Medicine

## 2017-09-13 ENCOUNTER — Observation Stay (HOSPITAL_COMMUNITY): Payer: 59

## 2017-09-13 ENCOUNTER — Observation Stay (HOSPITAL_BASED_OUTPATIENT_CLINIC_OR_DEPARTMENT_OTHER): Payer: 59

## 2017-09-13 DIAGNOSIS — R079 Chest pain, unspecified: Secondary | ICD-10-CM

## 2017-09-13 DIAGNOSIS — Z87891 Personal history of nicotine dependence: Secondary | ICD-10-CM | POA: Diagnosis present

## 2017-09-13 DIAGNOSIS — Z955 Presence of coronary angioplasty implant and graft: Secondary | ICD-10-CM | POA: Diagnosis not present

## 2017-09-13 DIAGNOSIS — E7849 Other hyperlipidemia: Secondary | ICD-10-CM | POA: Diagnosis present

## 2017-09-13 DIAGNOSIS — I10 Essential (primary) hypertension: Secondary | ICD-10-CM | POA: Diagnosis not present

## 2017-09-13 DIAGNOSIS — I5030 Unspecified diastolic (congestive) heart failure: Secondary | ICD-10-CM

## 2017-09-13 DIAGNOSIS — I2510 Atherosclerotic heart disease of native coronary artery without angina pectoris: Secondary | ICD-10-CM | POA: Diagnosis present

## 2017-09-13 DIAGNOSIS — Z720 Tobacco use: Secondary | ICD-10-CM | POA: Diagnosis not present

## 2017-09-13 DIAGNOSIS — I25110 Atherosclerotic heart disease of native coronary artery with unstable angina pectoris: Secondary | ICD-10-CM | POA: Diagnosis not present

## 2017-09-13 DIAGNOSIS — R9439 Abnormal result of other cardiovascular function study: Secondary | ICD-10-CM | POA: Diagnosis not present

## 2017-09-13 HISTORY — PX: TRANSTHORACIC ECHOCARDIOGRAM: SHX275

## 2017-09-13 HISTORY — PX: NM MYOVIEW LTD: HXRAD82

## 2017-09-13 LAB — TROPONIN I: Troponin I: <0.03 ng/mL (ref <0.03)

## 2017-09-13 LAB — POCT I-STAT, CHEM 8
BUN: 33 mg/dL — AB (ref 6–20)
Calcium, Ion: 1.13 mmol/L — ABNORMAL LOW (ref 1.15–1.40)
Chloride: 106 mmol/L (ref 101–111)
Creatinine, Ser: 0.8 mg/dL (ref 0.44–1.00)
Glucose, Bld: 156 mg/dL — ABNORMAL HIGH (ref 65–99)
HEMATOCRIT: 48 % — AB (ref 36.0–46.0)
HEMOGLOBIN: 16.3 g/dL — AB (ref 12.0–15.0)
Potassium: 8 mmol/L (ref 3.5–5.1)
Sodium: 132 mmol/L — ABNORMAL LOW (ref 135–145)
TCO2: 23 mmol/L (ref 22–32)

## 2017-09-13 LAB — LIPID PANEL
Cholesterol: 163 mg/dL (ref 0–200)
HDL: 36 mg/dL — AB (ref >40)
LDL Cholesterol: 103 mg/dL — ABNORMAL HIGH (ref 0–99)
Total CHOL/HDL Ratio: 4.5 RATIO
Triglycerides: 118 mg/dL (ref <150)
VLDL: 24 mg/dL (ref 0–40)

## 2017-09-13 LAB — HEPATIC FUNCTION PANEL
ALT: 16 U/L (ref 14–54)
AST: 18 U/L (ref 15–41)
Albumin: 3.2 g/dL — ABNORMAL LOW (ref 3.5–5.0)
Alkaline Phosphatase: 62 U/L (ref 38–126)
BILIRUBIN DIRECT: 0.2 mg/dL (ref 0.1–0.5)
BILIRUBIN INDIRECT: 0.9 mg/dL (ref 0.3–0.9)
Total Bilirubin: 1.1 mg/dL (ref 0.3–1.2)
Total Protein: 6.3 g/dL — ABNORMAL LOW (ref 6.5–8.1)

## 2017-09-13 LAB — NM MYOCAR MULTI W/SPECT W/WALL MOTION / EF
CHL CUP MPHR: 155 {beats}/min
CHL CUP RESTING HR STRESS: 74 {beats}/min
CSEPHR: 56 %
Estimated workload: 1 METS
Exercise duration (min): 7 min
Exercise duration (sec): 17 s
Peak HR: 88 {beats}/min

## 2017-09-13 LAB — BASIC METABOLIC PANEL
ANION GAP: 8 (ref 5–15)
BUN: 18 mg/dL (ref 6–20)
CALCIUM: 9 mg/dL (ref 8.9–10.3)
CO2: 25 mmol/L (ref 22–32)
Chloride: 105 mmol/L (ref 101–111)
Creatinine, Ser: 0.96 mg/dL (ref 0.44–1.00)
GFR calc Af Amer: >60 mL/min (ref >60)
GFR calc non Af Amer: >60 mL/min (ref >60)
GLUCOSE: 212 mg/dL — AB (ref 65–99)
Potassium: 3.8 mmol/L (ref 3.5–5.1)
Sodium: 138 mmol/L (ref 135–145)

## 2017-09-13 LAB — ECHOCARDIOGRAM COMPLETE
Height: 60 in
WEIGHTICAEL: 2208 [oz_av]

## 2017-09-13 LAB — CBC
HEMATOCRIT: 39.9 % (ref 36.0–46.0)
Hemoglobin: 12 g/dL (ref 12.0–15.0)
MCH: 22.6 pg — ABNORMAL LOW (ref 26.0–34.0)
MCHC: 30.1 g/dL (ref 30.0–36.0)
MCV: 75.1 fL — AB (ref 78.0–100.0)
Platelets: 284 10*3/uL (ref 150–400)
RBC: 5.31 MIL/uL — ABNORMAL HIGH (ref 3.87–5.11)
RDW: 14.1 % (ref 11.5–15.5)
WBC: 10.2 10*3/uL (ref 4.0–10.5)

## 2017-09-13 LAB — HIV ANTIBODY (ROUTINE TESTING W REFLEX): HIV Screen 4th Generation wRfx: NONREACTIVE

## 2017-09-13 SURGERY — LEFT HEART CATH AND CORONARY ANGIOGRAPHY
Anesthesia: LOCAL

## 2017-09-13 MED ORDER — TECHNETIUM TC 99M TETROFOSMIN IV KIT
30.0000 | PACK | Freq: Once | INTRAVENOUS | Status: AC | PRN
  Administered 2017-09-13: 30 via INTRAVENOUS

## 2017-09-13 MED ORDER — GI COCKTAIL ~~LOC~~
30.0000 mL | Freq: Four times a day (QID) | ORAL | Status: DC | PRN
  Administered 2017-09-15: 10:00:00 30 mL via ORAL
  Filled 2017-09-13: qty 30

## 2017-09-13 MED ORDER — METOPROLOL TARTRATE 12.5 MG HALF TABLET: 12.5000 mg | ORAL_TABLET | Freq: Two times a day (BID) | ORAL | Status: DC

## 2017-09-13 MED ORDER — NITROGLYCERIN IN D5W 200-5 MCG/ML-% IV SOLN
INTRAVENOUS | Status: AC
  Administered 2017-09-13: 06:00:00
  Filled 2017-09-13: qty 250

## 2017-09-13 MED ORDER — METOPROLOL TARTRATE 25 MG PO TABS
25.0000 mg | ORAL_TABLET | Freq: Two times a day (BID) | ORAL | Status: DC
  Administered 2017-09-13 – 2017-09-15 (×5): 25 mg via ORAL
  Filled 2017-09-13 (×5): qty 1

## 2017-09-13 MED ORDER — ACETAMINOPHEN 325 MG PO TABS: 650.0000 mg | ORAL_TABLET | ORAL | Status: DC | PRN

## 2017-09-13 MED ORDER — ONDANSETRON HCL 4 MG/2ML IJ SOLN
4.0000 mg | Freq: Four times a day (QID) | INTRAMUSCULAR | Status: DC | PRN
  Administered 2017-09-14: 4 mg via INTRAVENOUS

## 2017-09-13 MED ORDER — IPRATROPIUM-ALBUTEROL 0.5-2.5 (3) MG/3ML IN SOLN: 3.0000 mL | Freq: Once | RESPIRATORY_TRACT | Status: DC

## 2017-09-13 MED ORDER — NICOTINE 14 MG/24HR TD PT24
14.0000 mg | MEDICATED_PATCH | Freq: Every day | TRANSDERMAL | Status: DC
  Filled 2017-09-13 (×2): qty 1

## 2017-09-13 MED ORDER — REGADENOSON 0.4 MG/5ML IV SOLN
0.4000 mg | Freq: Once | INTRAVENOUS | Status: AC
  Administered 2017-09-13: 0.4 mg via INTRAVENOUS
  Filled 2017-09-13: qty 5

## 2017-09-13 MED ORDER — ATORVASTATIN CALCIUM 40 MG PO TABS
40.0000 mg | ORAL_TABLET | Freq: Every day | ORAL | Status: DC
  Administered 2017-09-13: 40 mg via ORAL
  Filled 2017-09-13: qty 1

## 2017-09-13 MED ORDER — TECHNETIUM TC 99M TETROFOSMIN IV KIT
10.0000 | PACK | Freq: Once | INTRAVENOUS | Status: AC | PRN
  Administered 2017-09-13: 10 via INTRAVENOUS

## 2017-09-13 MED ORDER — REGADENOSON 0.4 MG/5ML IV SOLN
INTRAVENOUS | Status: AC
  Filled 2017-09-13: qty 5

## 2017-09-13 MED ORDER — LISINOPRIL 10 MG PO TABS
10.0000 mg | ORAL_TABLET | Freq: Every evening | ORAL | Status: DC
  Administered 2017-09-13: 10 mg via ORAL
  Filled 2017-09-13: qty 1

## 2017-09-13 MED ORDER — ENOXAPARIN SODIUM 40 MG/0.4ML ~~LOC~~ SOLN
40.0000 mg | Freq: Every day | SUBCUTANEOUS | Status: DC
  Administered 2017-09-13: 40 mg via SUBCUTANEOUS
  Filled 2017-09-13: qty 0.4

## 2017-09-13 NOTE — Progress Notes (Addendum)
    Reviewed MV results w/ Dr Herbie BaltimoreHarding. With possible ischemia, will make NPO after midnight, he will discuss results w/ her in am. Spoke w/ pt and daughter in the room, they are aware.   Ashley Demarkhonda Barrett, PA-C 09/13/2017 6:02 PM Beeper [phone]

## 2017-09-13 NOTE — ED Notes (Signed)
Pt family exited room to inform nurse that pt reported that she had been struck in the head by a falling cabinet in the ambulance during transfer. Pt w/ noted small bump to R side of head and reporting severe 8/10 HA w/ ear pain. Pt reports headache is localized to R side of head. Pt w/ no focal neuro deficits A&Ox4, PERRL. Denies N/V, diplopia or vision changes. Admitted team paged and made aware of family report.

## 2017-09-13 NOTE — Progress Notes (Signed)
Family Medicine Teaching Service Daily Progress Note Intern Pager: [phone]  Patient name: Kourtnei Rauber Medical record number: [id number] Date of birth: 07/10/1950 Age: 67 y.o. Gender: female  Primary Care Provider: Kaleen Mask, MD Consultants: cardiology Code Status: full  Pt Overview and Major Events to Date:  6/17 admitted to fpts 6/18 nuclear stress test  Assessment and Plan: Deloros Beretta is a 67 y.o. female presenting with chest pain. PMH is significant for HTN, tobacco abuse.  Chest pain.  Pain improved, no longer requiring nitro gtt. Per cardiology recs will get nuclear stress test to better evaluate given chronic calcifications present on CTA. Trop trend stable at 0.03, ekg with partial RBBB. Will follow up stress test and cards recs - vital signs per stepdown routine - follow up nuclear stress test - follow up cardiology recs - off nitro gtt, SL nitro as needed - trend troponins  Headache with reported head trauma. Patient reported that a cabinet fell on her head during ambulance ride. Some headache, ct head normal. - tylenol for pain  Hypertension.  BP 107/56. -Continue home lisinopril 10 mg daily  Tobacco abuse.  Significant smoking history, likely has some component of undiagnosed COPD -Nicotine patch -PFTs as outpatient, anticipate will likely need controller inhaler  Pulmonary nodule.  Seen on CTA chest,  5 mm nodule on anterior right middle lobe stable from 2014. - Follow up as outpatient  FEN/GI: heart healthy PPx: lovenox  Disposition: pending clinical course  Subjective:  Feels like she is doing much better this am. Chest pain resolved. Breathing well.  Objective: Temp:  [98.6 F (37 C)-100.1 F (37.8 C)] 98.6 F (37 C) (06/18 0537) Pulse Rate:  [83-106] 102 (06/18 0953) Resp:  [18-34] 25 (06/18 0537) BP: (89-178)/(47-90) 107/56 (06/18 1151) SpO2:  [90 %-100 %] 93 % (06/18 0600) Weight:  [138 lb (62.6 kg)] 138 lb (62.6 kg)  (06/17 1432) Physical Exam: General: Lying in bed comfortably, in no acute distress, resting comfortably on room air Cardiovascular: RRR, no m/r/g Respiratory: improving coarse breath sounds in BLL, satting well on room air Gastrointestinal: Soft, NT, ND, BS + MSK: Moving all limbs equally, no lower extremity edema Derm: Warm and dry Neuro: Alert and awake, oriented, grossly normal Psych: Appropriate affect  Laboratory: Recent Labs  Lab 09/12/17 1454 09/12/17 1503 09/12/17 1529  WBC 11.5*  --   --   HGB 13.8 16.3*  16.3* 16.0*  HCT 44.6 48.0*  48.0* 47.0*  PLT 339  --   --    Recent Labs  Lab 09/12/17 1454 09/12/17 1503 09/12/17 1529 09/13/17 0826  NA 135 132*  132* 137  --   K 4.0 8.0*  8.0* 4.7  --   CL 102 106  106 104  --   CO2 22  --   --   --   BUN 20 33*  33* 29*  --   CREATININE 0.87 0.80  0.80 0.80  --   CALCIUM 10.3  --   --   --   PROT  --   --   --  6.3*  BILITOT  --   --   --  1.1  ALKPHOS  --   --   --  62  ALT  --   --   --  16  AST  --   --   --  18  GLUCOSE 157* 156*  156* 160*  --     Imaging/Diagnostic Tests: CLINICAL DATA:  Right-sided headache after  a cabinet fell on to the head. History of hypertension.  EXAM: CT HEAD WITHOUT CONTRAST  TECHNIQUE: Contiguous axial images were obtained from the base of the skull through the vertex without intravenous contrast.  COMPARISON:  07/30/2012  FINDINGS: Brain: No evidence of acute infarction, hemorrhage, hydrocephalus, extra-axial collection or mass lesion/mass effect.  Vascular: Intracranial arterial vascular calcifications are present.  Skull: Normal. Negative for fracture or focal lesion.  Sinuses/Orbits: Mucosal thickening in the paranasal sinuses. No acute air-fluid levels. Mastoid air cells are clear.  Other: None.  IMPRESSION: No acute intracranial abnormalities.  Myrene BuddyFletcher, Adeli Frost, MD 09/13/2017, 11:54 AM PGY-1, [facility] FPTS Intern pager:  [phone], text pages welcome

## 2017-09-13 NOTE — Progress Notes (Signed)
  Echocardiogram 2D Echocardiogram has been performed.  Delcie RochENNINGTON, Mikella Linsley 09/13/2017, 2:57 PM

## 2017-09-13 NOTE — Progress Notes (Signed)
   Ashley HainesLeticia Moran presented for a nuclear stress test today.  No immediate complications.  Stress imaging is pending at this time.  Preliminary EKG findings may be listed in the chart, but the stress test result will not be finalized until perfusion imaging is complete.  1 day study, [place] to read.  Theodore Demarkhonda Barrett, PA-C 09/13/2017, 12:08 PM

## 2017-09-13 NOTE — Consult Note (Addendum)
Cardiology Consultation:   Patient ID: Ashley Moran; [id number]; [birth year]   Admit date: 09/12/2017 Date of Consult: 09/13/2017  Primary Care Provider: Kaleen Mask, MD Primary Cardiologist: Bryan Lemma, MD  Primary Electrophysiologist:     Patient Profile:   Ashley Moran is a 67 y.o. female with a hx of HTN and tobacco use who is being seen today for the evaluation of chest pain at the request of Dr. Jennette Kettle.  History of Present Illness:   Ashley Moran presented to [facility] via EMS for sudden onset of chest pain at approximately 0400 yesterday morning. Pt states that yesterday she woke up at 0400 for work as usual. When she started to get ready for work, she experienced sudden on set of chest pain that was located in her central chest and right-sided chest. The pain was rated as a 10/10. The pain eventually radiated to her central abdomen and she thought she had indigestion. 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 She continued to have the chest pain. She denies nausea, vomiting, and diaphoresis. She thought the pain was related to eating/indigestion so she tried to drink soda in order to burp and took rolaids/tums - no relief with either. Her work Interior and spatial designer. She takes 500 mg ASA daily for pain. She was given nitro SL with EMS but she states this did not relieve her pain. She states that the morphine relieved her pain. She had hypertensive urgency when EMS arrived with systolic pressure in the 220s. She was given nitro SL x 1 with resolution of her HTN to the 110s.  EKG showed a new incomplete RBBB (new compared to last EKG in 2014). She required supplemental oxygen in ER, which is new for her. CTA negative for PE. BNP normal. Labs on arrival were significant for mild leukocytosis of 11.5. Unfortunately, apparently a cabinet fell on her head in the ambulance. Head CT negative for acute intracranial abnormalities.   Of note, CTA  chest showed extensive coronary artery calcifications. She smokes, has HTN, and states she used to take cholesterol medicine, but doesn't anymore. Hypertension is managed at home with 10 mg lisinopril. She denies family history of heart disease.   Past Medical History:  Diagnosis Date  . Hypertension     History reviewed. No pertinent surgical history.   Home Medications:  Prior to Admission medications   Medication Sig Start Date End Date Taking? Authorizing Provider  Aspirin-Caffeine (BAYER BACK & BODY) 500-32.5 MG TABS Take 1 tablet by mouth See admin instructions. Take 1 tablet by mouth in the morning and 2-3 times daily, additionally, as needed for pain   Yes [provider]  lisinopril (PRINIVIL,ZESTRIL) 10 MG tablet Take 10 mg by mouth every evening.   Yes [provider]  Multiple Vitamins-Minerals (HAIR SKIN AND NAILS FORMULA) TABS Take 1 tablet by mouth 2 (two) times daily.   Yes [provider]  diclofenac (VOLTAREN) 75 MG EC tablet Take 1 tablet (75 mg total) by mouth 2 (two) times daily. Patient not taking: Reported on 09/12/2017 08/23/12   Ashley Crease, MD  traMADol (ULTRAM) 50 MG tablet Take 1 tablet (50 mg total) by mouth every 6 (six) hours as needed for pain. Patient not taking: Reported on 09/12/2017 08/23/12   Ashley Crease, MD    Inpatient Medications: Scheduled Meds: . enoxaparin (LOVENOX) injection  40 mg Subcutaneous QHS  . lisinopril  10 mg Oral QPM  . nicotine  14 mg Transdermal Daily   Continuous Infusions:  PRN Meds: acetaminophen, gi cocktail, nitroGLYCERIN, ondansetron (ZOFRAN) IV  Allergies:   No Known Allergies  Social History:   Social History   Socioeconomic History  . Marital status: Widowed    Spouse name: Not on file  . Number of children: Not on file  . Years of education: Not on file  . Highest education level: Not on file  Occupational History  . Not on file  Social Needs  . Financial  resource strain: Not on file  . Food insecurity:    Worry: Not on file    Inability: Not on file  . Transportation needs:    Medical: Not on file    Non-medical: Not on file  Tobacco Use  . Smoking status: Current Every Day Smoker    Packs/day: 0.50  . Smokeless tobacco: Never Used  Substance and Sexual Activity  . Alcohol use: No  . Drug use: No  . Sexual activity: Not on file  Lifestyle  . Physical activity:    Days per week: Not on file    Minutes per session: Not on file  . Stress: Not on file  Relationships  . Social connections:    Talks on phone: Not on file    Gets together: Not on file    Attends religious service: Not on file    Active member of club or organization: Not on file    Attends meetings of clubs or organizations: Not on file    Relationship status: Not on file  . Intimate partner violence:    Fear of current or ex partner: Not on file    Emotionally abused: Not on file    Physically abused: Not on file    Forced sexual activity: Not on file  Other Topics Concern  . Not on file  Social History Narrative  . Not on file    Family History:    Family History  Problem Relation Age of Onset  . Other Mother        irregular heartbeat not on any medication     ROS:  Please see the history of present illness.   All other ROS reviewed and negative.     Physical Exam/Data:   Vitals:   09/13/17 0019 09/13/17 0027 09/13/17 0537 09/13/17 0600  BP: (!) 111/51  (!) 109/47 (!) 95/47  Pulse: 96  99   Resp: (!) 30  (!) 25   Temp:  100.1 F (37.8 C) 98.6 F (37 C)   TempSrc:  Oral Oral   SpO2: 92%  91% 93%  Weight:      Height:        Intake/Output Summary (Last 24 hours) at 09/13/2017 0639 Last data filed at 09/13/2017 0015 Gross per 24 hour  Intake 152.3 ml  Output 575 ml  Net -422.7 ml   Filed Weights   09/12/17 1422 09/12/17 1432  Weight: 138 lb (62.6 kg) 138 lb (62.6 kg)   Body mass index is 26.95 kg/m.  General:  Well nourished,  well developed, in no acute distress HEENT: normal Neck: no JVD Vascular: No carotid bruits Cardiac:  normal S1, S2; RRR; no murmur Lungs:  Scattered crackles in bases, respirations unlabored  Abd: soft, nontender, no hepatomegaly  Ext: no edema Musculoskeletal:  No deformities, BUE and BLE strength normal and equal Skin: warm and dry  Neuro:  CNs 2-12 intact, no focal abnormalities noted Psych:  Normal affect   EKG:  The EKG was personally reviewed and demonstrates:  Sinus with incomplete RBBB, new since 2014 Telemetry:  Telemetry was personally reviewed and demonstrates:  Sinus rhythm  Relevant CV Studies:  Echo pending   Laboratory Data:  Chemistry Recent Labs  Lab 09/12/17 1454 09/12/17 1503 09/12/17 1529  NA 135 132* 137  K 4.0 8.0* 4.7  CL 102 106 104  CO2 22  --   --   GLUCOSE 157* 156* 160*  BUN 20 33* 29*  CREATININE 0.87 0.80 0.80  CALCIUM 10.3  --   --   GFRNONAA >60  --   --   GFRAA >60  --   --   ANIONGAP 11  --   --     No results for input(s): PROT, ALBUMIN, AST, ALT, ALKPHOS, BILITOT in the last 168 hours. Hematology Recent Labs  Lab 09/12/17 1454 09/12/17 1503 09/12/17 1529  WBC 11.5*  --   --   RBC 6.08*  --   --   HGB 13.8 16.3* 16.0*  HCT 44.6 48.0* 47.0*  MCV 73.4*  --   --   MCH 22.7*  --   --   MCHC 30.9  --   --   RDW 14.1  --   --   PLT 339  --   --    Cardiac Enzymes Recent Labs  Lab 09/13/17 0251  TROPONINI <0.03    Recent Labs  Lab 09/12/17 1459 09/12/17 1804  TROPIPOC 0.00 0.01    BNP Recent Labs  Lab 09/12/17 1454  BNP 50.0    DDimer No results for input(s): DDIMER in the last 168 hours.  Radiology/Studies:  Ct Head Wo Contrast  Result Date: 09/13/2017 CLINICAL DATA:  Right-sided headache after a cabinet fell on to the head. History of hypertension. EXAM: CT HEAD WITHOUT CONTRAST TECHNIQUE: Contiguous axial images were obtained from the base of the skull through the vertex without intravenous contrast.  COMPARISON:  07/30/2012 FINDINGS: Brain: No evidence of acute infarction, hemorrhage, hydrocephalus, extra-axial collection or mass lesion/mass effect. Vascular: Intracranial arterial vascular calcifications are present. Skull: Normal. Negative for fracture or focal lesion. Sinuses/Orbits: Mucosal thickening in the paranasal sinuses. No acute air-fluid levels. Mastoid air cells are clear. Other: None. IMPRESSION: No acute intracranial abnormalities. Electronically Signed   By: Burman Nieves M.D.   On: 09/13/2017 00:47   Dg Chest Portable 1 View  Result Date: 09/12/2017 CLINICAL DATA:  Severe shortness of breath with mid chest pain today. EXAM: PORTABLE CHEST 1 VIEW COMPARISON:  Radiographs 08/23/2012.  CT 07/30/2012. FINDINGS: 1441 hours. The heart size and mediastinal contours are stable. There is aortic atherosclerosis. There is mild pulmonary edema without confluent airspace opacity, pleural effusion or pneumothorax. No acute osseous findings are seen. Multiple telemetry leads overlie the chest. IMPRESSION: Mild congestive heart failure. Electronically Signed   By: Carey Bullocks M.D.   On: 09/12/2017 14:50   Ct Angio Chest/abd/pel For Dissection W And/or W/wo  Result Date: 09/12/2017 CLINICAL DATA:  67 year old with chest pain, cardiac etiology suspected. EXAM: CT ANGIOGRAPHY CHEST, ABDOMEN AND PELVIS TECHNIQUE: Multidetector CT imaging through the chest, abdomen and pelvis was performed using the standard protocol during bolus administration of intravenous contrast. Multiplanar reconstructed images and MIPs were obtained and reviewed to evaluate the vascular anatomy. CONTRAST:  ISOVUE-370 IOPAMIDOL (ISOVUE-370) INJECTION 76% COMPARISON:  Recent CTA examination 07/30/2012 FINDINGS: CTA CHEST FINDINGS Cardiovascular: Extensive coronary artery calcifications. No evidence for aortic dissection or intramural hematoma. Normal caliber of the thoracic aorta.  Great vessels are patent. Atherosclerotic  plaque at the aortic arch is similar to the previous examination. Main pulmonary arteries are patent without large pulmonary emboli. Mediastinum/Nodes: No significant mediastinal or hilar lymphadenopathy. No axillary lymphadenopathy. Lungs/Pleura: Trachea and mainstem bronchi are patent. No pleural effusions. Stable 5 mm pleural-based nodule in the anterior right middle lobe on sequence 7, image 60. Again noted is a mosaic attenuation pattern throughout the lungs. In addition, there is probably atelectasis in the lower lobes. No large areas of airspace disease or lung consolidation. Musculoskeletal: No acute bone abnormality. Review of the MIP images confirms the above findings. CTA ABDOMEN AND PELVIS FINDINGS VASCULAR Aorta: Atherosclerotic disease in the abdominal aorta without aneurysm or dissection. Celiac: Mild narrowing at the origin. Celiac trunk and main branch vessels are patent. SMA: SMA is patent with mild atherosclerotic disease. No significant stenosis. Renals: Mild narrowing at the origin of the right renal artery. Atherosclerotic plaque at origin of the left renal artery without significant stenosis. IMA: IMA is patent. Inflow: Common, external and internal iliac arteries are patent bilaterally. Mild disease in the common femoral arteries bilaterally. The proximal right femoral arteries are patent. There is severe narrowing in the proximal left SFA. Proximal left deep femoral arteries are patent. Veins: No obvious venous abnormality within the limitations of this arterial phase study. Review of the MIP images confirms the above findings. NON-VASCULAR Hepatobiliary: There appears to be a fold or phrygian cap at the gallbladder fundus containing a chronic stone. No evidence for gallbladder distension or inflammation. Normal appearance of the liver without biliary dilatation. Pancreas: Unremarkable. No pancreatic ductal dilatation or surrounding inflammatory changes. Spleen: Normal in size without focal  abnormality. Adrenals/Urinary Tract: Adrenal glands are normal. Normal appearance of the kidneys without hydronephrosis or suspicious lesions. Mild distention of the urinary bladder. Stomach/Bowel: Stomach is within normal limits. Appendix appears normal. No evidence of bowel wall thickening, distention, or inflammatory changes. Lymphatic: No significant lymph node enlargement in the abdomen or pelvis. Reproductive: Uterus and bilateral adnexa are unremarkable. Other: No ascites.  Negative for free air. Musculoskeletal: No acute bone abnormality. Review of the MIP images confirms the above findings. IMPRESSION: Vascular: No acute aortic abnormality. Specifically, no evidence for an aortic aneurysm or dissection. Extensive coronary artery calcifications. 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 Nonvascular: Persistent mosaic attenuation pattern in the lungs. Findings are nonspecific but could be related to chronic air trapping. No acute abnormality in the abdomen or pelvis. 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 No evidence for gallbladder inflammation. Electronically Signed   By: Richarda OverlieAdam  Henn M.D.   On: 09/12/2017 17:16    Assessment and Plan:   1. Chest pain - troponin x 3 negative - EKG with new incomplete RBBB (new since 2014) - nitro drip off - pt has not had a recurrence of chest pain since nitro SL and morphine - chest pain with typical and atypical features - echo pending - pt has risk factors for ACS including smoking, HTN, and past HLD (not on cholesterol medicine anymore) - she describes chest pain with typical and atypical features. - CTA chest with signs of coronary calcification - given her risk factors and calcification, she would likely benefit from an ischemic evaluation, will plan for heart cath today   2. Hypertensive urgency - resolved, pressures now 90-100s - 10 mg lisinopril   3. HLD - will check lipid panel and LFTs - no longer takes  cholesterol medicine   4. Current smoker -  encouraged cessation   For questions or updates, please contact [facility] Please consult [email or web address] for contact info under Cardiology/STEMI.   Signed, Marcelino Duster, [place]  09/13/2017 6:39 AM  I have seen, examined and evaluated the patient this AM along with Bettina Gavia, PA.  After reviewing all the available data and chart, we discussed the patients laboratory, study & physical findings as well as symptoms in detail. I agree with her findings, examination as well as impression recommendations as per our discussion.    Principal Problem:   Chest pain with high risk for cardiac etiology Active Problems:   Coronary artery calcification seen on CAT scan   Hyperlipidemia due to dietary fat intake   Essential hypertension   Smoker  Ms. Urizar has cardiac risk factors of hypertension hyperlipidemia and smoking with significant wheezing at baseline.  She presents after prolonged episodes of intermittent substernal chest discomfort that began after waking up to go to work.  Apparently she had the most significant symptom after returning from smoking during her break while sitting down.  It was significant enough that she called for someone to take her to the hospital.  She is ruled out for MI and PE with  CTA of the chest, but that the noncontrasted CT shows significant extensive coronary calcification especially in the left main LAD and circumflex distribution. She tells me that prior to this episode just this past weekend she was doing line dancing for 3 days and had no issues with chest tightness or pressure with rest or exertion.  She seems to think that her symptoms leading to her admission were related to having eaten a pork chop prior to going to bed.  Despite the fact that she was not getting any relief from Rolaids, Tums and ginger ale which usually help for her indigestion.  I discussed the findings reference the coronary calcification  on CT scan and my concerns about her having multivessel CAD.  I did give her the options of cardiac cath-coronary angiography versus nuclear stress test with my preference being coronary angiography she immediately declined The invasive option of cardiac catheterization and simply voiced a desire to go home .  She wanted the stress test to be done as outpatient, but I think with prolonged chest pain and this extensive coronary calcification that she should have a stress test done while inpatient. she was agreeable to that we will therefore scheduled for today.  With extensive coronary calcification is clear that she has CAD, would restart statin along with aspirin and consider beta-blocker given her resting heart rate of 100 beats a minute.  We will follow-up following the results of stress test.   Bryan Lemma, M.D., M.S. Interventional Cardiologist   Pager # [phone] Phone # [phone] [address] [place], [place] [address]

## 2017-09-14 ENCOUNTER — Encounter (HOSPITAL_COMMUNITY)
Admission: EM | Disposition: A | Discharge status: Home / Self Care | Payer: Self-pay | Source: Home / Self Care | Attending: Family Medicine

## 2017-09-14 DIAGNOSIS — I25110 Atherosclerotic heart disease of native coronary artery with unstable angina pectoris: Secondary | ICD-10-CM | POA: Diagnosis not present

## 2017-09-14 DIAGNOSIS — R9439 Abnormal result of other cardiovascular function study: Secondary | ICD-10-CM | POA: Clinically undetermined

## 2017-09-14 DIAGNOSIS — I200 Unstable angina: Secondary | ICD-10-CM | POA: Diagnosis present

## 2017-09-14 DIAGNOSIS — F17200 Nicotine dependence, unspecified, uncomplicated: Secondary | ICD-10-CM

## 2017-09-14 DIAGNOSIS — I2510 Atherosclerotic heart disease of native coronary artery without angina pectoris: Secondary | ICD-10-CM

## 2017-09-14 HISTORY — PX: CORONARY STENT INTERVENTION: CATH118234

## 2017-09-14 HISTORY — PX: LEFT HEART CATH AND CORONARY ANGIOGRAPHY: CATH118249

## 2017-09-14 LAB — TROPONIN I: Troponin I: <0.03 ng/mL (ref <0.03)

## 2017-09-14 LAB — POCT ACTIVATED CLOTTING TIME
ACTIVATED CLOTTING TIME: 367 s
ACTIVATED CLOTTING TIME: 147 s
Activated Clotting Time: 180 seconds

## 2017-09-14 LAB — PROTIME-INR
INR: 1.05
PROTHROMBIN TIME: 13.6 s (ref 11.4–15.2)

## 2017-09-14 SURGERY — LEFT HEART CATH AND CORONARY ANGIOGRAPHY
Anesthesia: LOCAL

## 2017-09-14 MED ORDER — ASPIRIN EC 81 MG PO TBEC: 81.0000 mg | DELAYED_RELEASE_TABLET | Freq: Every day | ORAL | Status: DC

## 2017-09-14 MED ORDER — DIAZEPAM 5 MG PO TABS
5.0000 mg | ORAL_TABLET | Freq: Four times a day (QID) | ORAL | Status: DC | PRN
  Administered 2017-09-14: 22:00:00 5 mg via ORAL
  Filled 2017-09-14: qty 1

## 2017-09-14 MED ORDER — ONDANSETRON HCL 4 MG/2ML IJ SOLN
INTRAMUSCULAR | Status: AC
  Filled 2017-09-14: qty 2

## 2017-09-14 MED ORDER — BIVALIRUDIN TRIFLUOROACETATE 250 MG IV SOLR
INTRAVENOUS | Status: AC
  Filled 2017-09-14: qty 250

## 2017-09-14 MED ORDER — ZOLPIDEM TARTRATE 5 MG PO TABS: 5.0000 mg | ORAL_TABLET | Freq: Every evening | ORAL | Status: DC | PRN

## 2017-09-14 MED ORDER — SODIUM CHLORIDE 0.9 % IV SOLN
INTRAVENOUS | Status: DC
  Administered 2017-09-14 – 2017-09-15 (×2): via INTRAVENOUS

## 2017-09-14 MED ORDER — SODIUM CHLORIDE 0.9 % IV SOLN
INTRAVENOUS | Status: DC
  Administered 2017-09-14 (×2): via INTRAVENOUS

## 2017-09-14 MED ORDER — MORPHINE SULFATE (PF) 10 MG/ML IV SOLN: 2.0000 mg | INTRAVENOUS | Status: DC | PRN

## 2017-09-14 MED ORDER — ONDANSETRON HCL 4 MG/2ML IJ SOLN
4.0000 mg | Freq: Four times a day (QID) | INTRAMUSCULAR | Status: DC | PRN
  Filled 2017-09-14: qty 2

## 2017-09-14 MED ORDER — HYDRALAZINE HCL 20 MG/ML IJ SOLN: 5.0000 mg | INTRAMUSCULAR | Status: AC | PRN

## 2017-09-14 MED ORDER — ASPIRIN 81 MG PO CHEW
81.0000 mg | CHEWABLE_TABLET | Freq: Every day | ORAL | Status: DC
  Administered 2017-09-15: 81 mg via ORAL
  Filled 2017-09-14: qty 1

## 2017-09-14 MED ORDER — MORPHINE SULFATE (PF) 2 MG/ML IV SOLN
2.0000 mg | INTRAVENOUS | Status: DC | PRN
Start: 2017-09-14 — End: 2017-09-15
  Administered 2017-09-14: 23:00:00 2 mg via INTRAVENOUS
  Filled 2017-09-14: qty 1

## 2017-09-14 MED ORDER — FENTANYL CITRATE (PF) 100 MCG/2ML IJ SOLN
INTRAMUSCULAR | Status: DC | PRN
  Administered 2017-09-14 (×4): 25 ug via INTRAVENOUS

## 2017-09-14 MED ORDER — NITROGLYCERIN 1 MG/10 ML FOR IR/CATH LAB
INTRA_ARTERIAL | Status: AC
  Filled 2017-09-14: qty 10

## 2017-09-14 MED ORDER — FENTANYL CITRATE (PF) 100 MCG/2ML IJ SOLN
INTRAMUSCULAR | Status: AC
  Filled 2017-09-14: qty 2

## 2017-09-14 MED ORDER — SODIUM CHLORIDE 0.9% FLUSH
3.0000 mL | Freq: Two times a day (BID) | INTRAVENOUS | Status: DC
  Administered 2017-09-14: 23:00:00 3 mL via INTRAVENOUS

## 2017-09-14 MED ORDER — TICAGRELOR 90 MG PO TABS
ORAL_TABLET | ORAL | Status: DC | PRN
  Administered 2017-09-14: 180 mg via ORAL

## 2017-09-14 MED ORDER — SODIUM CHLORIDE 0.9 % IV SOLN
INTRAVENOUS | Status: AC | PRN
  Administered 2017-09-14: 1.75 mg/kg/h via INTRAVENOUS

## 2017-09-14 MED ORDER — MIDAZOLAM HCL 2 MG/2ML IJ SOLN
INTRAMUSCULAR | Status: AC
  Filled 2017-09-14: qty 2

## 2017-09-14 MED ORDER — TICAGRELOR 90 MG PO TABS
ORAL_TABLET | ORAL | Status: AC
  Filled 2017-09-14: qty 2

## 2017-09-14 MED ORDER — TICAGRELOR 90 MG PO TABS
90.0000 mg | ORAL_TABLET | Freq: Two times a day (BID) | ORAL | Status: DC
  Administered 2017-09-15: 06:00:00 90 mg via ORAL
  Filled 2017-09-14: qty 1

## 2017-09-14 MED ORDER — HEPARIN (PORCINE) IN NACL 1000-0.9 UT/500ML-% IV SOLN
INTRAVENOUS | Status: AC
  Filled 2017-09-14: qty 500

## 2017-09-14 MED ORDER — ASPIRIN 81 MG PO CHEW
324.0000 mg | CHEWABLE_TABLET | ORAL | Status: AC
  Administered 2017-09-14: 324 mg via ORAL
  Filled 2017-09-14: qty 4

## 2017-09-14 MED ORDER — LIDOCAINE HCL (PF) 1 % IJ SOLN
INTRAMUSCULAR | Status: DC | PRN
  Administered 2017-09-14: 2 mL
  Administered 2017-09-14: 30 mL

## 2017-09-14 MED ORDER — FAMOTIDINE IN NACL 20-0.9 MG/50ML-% IV SOLN
20.0000 mg | Freq: Once | INTRAVENOUS | Status: AC
  Administered 2017-09-14: 20 mg via INTRAVENOUS

## 2017-09-14 MED ORDER — MIDAZOLAM HCL 2 MG/2ML IJ SOLN
INTRAMUSCULAR | Status: DC | PRN
  Administered 2017-09-14 (×2): 1 mg via INTRAVENOUS

## 2017-09-14 MED ORDER — ANGIOPLASTY BOOK
Freq: Once | Status: AC
  Administered 2017-09-14: 22:00:00
  Filled 2017-09-14: qty 1

## 2017-09-14 MED ORDER — NITROGLYCERIN 1 MG/10 ML FOR IR/CATH LAB
INTRA_ARTERIAL | Status: DC | PRN
  Administered 2017-09-14 (×2): 200 ug via INTRACORONARY
  Administered 2017-09-14: 100 ug

## 2017-09-14 MED ORDER — IOHEXOL 350 MG/ML SOLN
INTRAVENOUS | Status: DC | PRN
  Administered 2017-09-14: 175 mL via INTRAVENOUS

## 2017-09-14 MED ORDER — ATROPINE SULFATE 1 MG/10ML IJ SOSY
PREFILLED_SYRINGE | INTRAMUSCULAR | Status: AC
  Filled 2017-09-14: qty 10

## 2017-09-14 MED ORDER — FAMOTIDINE IN NACL 20-0.9 MG/50ML-% IV SOLN
INTRAVENOUS | Status: AC
  Filled 2017-09-14: qty 50

## 2017-09-14 MED ORDER — LABETALOL HCL 5 MG/ML IV SOLN
10.0000 mg | INTRAVENOUS | Status: AC | PRN
  Filled 2017-09-14: qty 4

## 2017-09-14 MED ORDER — BIVALIRUDIN BOLUS VIA INFUSION - CUPID
INTRAVENOUS | Status: DC | PRN
  Administered 2017-09-14: 42.225 mg via INTRAVENOUS

## 2017-09-14 MED ORDER — SODIUM CHLORIDE 0.9 % IV SOLN: 250.0000 mL | INTRAVENOUS | Status: DC | PRN

## 2017-09-14 MED ORDER — LIDOCAINE HCL (PF) 1 % IJ SOLN
INTRAMUSCULAR | Status: AC
  Filled 2017-09-14: qty 30

## 2017-09-14 MED ORDER — SODIUM CHLORIDE 0.9% FLUSH: 3.0000 mL | INTRAVENOUS | Status: DC | PRN

## 2017-09-14 MED ORDER — SODIUM CHLORIDE 0.9% FLUSH
3.0000 mL | Freq: Two times a day (BID) | INTRAVENOUS | Status: DC
  Administered 2017-09-14: 3 mL via INTRAVENOUS

## 2017-09-14 MED ORDER — ACETAMINOPHEN 325 MG PO TABS: 650.0000 mg | ORAL_TABLET | ORAL | Status: DC | PRN

## 2017-09-14 SURGICAL SUPPLY — 22 items
BALLN SAPPHIRE 2.0X12 (BALLOONS) ×2
BALLN SAPPHIRE ~~LOC~~ 2.5X15 (BALLOONS) ×2 IMPLANT
BALLOON SAPPHIRE 2.0X12 (BALLOONS) ×1 IMPLANT
CATH INFINITI 5FR MULTPACK ANG (CATHETERS) ×2 IMPLANT
CATH LAUNCHER 6FR JR4 (CATHETERS) ×2 IMPLANT
CATHETER LAUNCHER 6FR JR4 SH (CATHETERS) ×2 IMPLANT
COVER PRB 48X5XTLSCP FOLD TPE (BAG) ×1 IMPLANT
COVER PROBE 5X48 (BAG) ×1
GLIDESHEATH SLEND SS 6F .021 (SHEATH) ×2 IMPLANT
GUIDEWIRE INQWIRE 1.5J.035X260 (WIRE) ×1 IMPLANT
INQWIRE 1.5J .035X260CM (WIRE) ×2
KIT ENCORE 26 ADVANTAGE (KITS) ×2 IMPLANT
KIT HEART LEFT (KITS) ×2 IMPLANT
PACK CARDIAC CATHETERIZATION (CUSTOM PROCEDURE TRAY) ×2 IMPLANT
SHEATH AVANTI 11CM 5FR (SHEATH) ×2 IMPLANT
SHEATH PINNACLE 6F 10CM (SHEATH) ×2 IMPLANT
STENT RESOLUTE ONYX 2.5X26 (Permanent Stent) ×2 IMPLANT
TRANSDUCER W/STOPCOCK (MISCELLANEOUS) ×2 IMPLANT
TUBING CIL FLEX 10 FLL-RA (TUBING) ×2 IMPLANT
WIRE EMERALD 3MM-J .035X150CM (WIRE) ×2 IMPLANT
WIRE MICROINTRODUCER 60CM (WIRE) ×2 IMPLANT
WIRE PT2 MS 185 (WIRE) ×2 IMPLANT

## 2017-09-14 NOTE — Progress Notes (Addendum)
Inpatient Diabetes Program Recommendations  AACE/ADA: New Consensus Statement on Inpatient Glycemic Control (2019)  Target Ranges:  Prepandial:   less than 140 mg/dL      Peak postprandial:   less than 180 mg/dL (1-2 hours)      Critically ill patients:  140 - 180 mg/dL   Results for Jolayne HainesMCNEAL, Jalyric (MRN [id number]) as of 09/14/2017 12:19  Ref. Range 09/12/2017 14:54 09/12/2017 15:03 09/12/2017 15:03 09/12/2017 15:29 09/13/2017 15:19  Glucose Latest Ref Range: 65 - 99 mg/dL 409157 (H) 811156 (H) 914156 (H) 160 (H) 212 (H)   Review of Glycemic Control  Diabetes history: No Outpatient Diabetes medications: NA Current orders for Inpatient glycemic control: None  Inpatient Diabetes Program Recommendations: Correction (SSI): While inpatient, may want to consider ordering CBGs with Novolog 0-9 units TID with meals and Novolog 0-5 units QHS. HgbA1C: Please consider ordering an A1C to evaluate glycemic control over the past 2-3 months.   Thanks, Orlando PennerMarie Demeco Ducksworth, RN, MSN, CDE Diabetes Coordinator Inpatient Diabetes Program [phone] (Team Pager from 8am to 5pm)

## 2017-09-14 NOTE — H&P (View-Only) (Signed)
Progress Note  Patient Name: Ashley Moran Date of Encounter: 09/14/2017  Primary Care Provider: Kaleen Mask, MD Primary Cardiologist: Bryan Lemma, MD  Primary Attending: Dr. Tawanna Cooler McDiarmid -Resident Dr. Myrene Buddy   Subjective   No further chest discomfort overnight.  Very anxious  Inpatient Medications    Scheduled Meds: . atorvastatin  40 mg Oral q1800  . enoxaparin (LOVENOX) injection  40 mg Subcutaneous QHS  . ipratropium-albuterol  3 mL Nebulization Once  . lisinopril  10 mg Oral QPM  . metoprolol tartrate  25 mg Oral BID  . nicotine  14 mg Transdermal Daily   Continuous Infusions:  PRN Meds: acetaminophen, gi cocktail, nitroGLYCERIN, ondansetron (ZOFRAN) IV   Vital Signs    Vitals:   09/13/17 1728 09/13/17 2000 09/14/17 0304 09/14/17 0311  BP: (!) 148/74 134/79  112/60  Pulse:  72  60  Resp:  (!) 24  16  Temp:  98.2 F (36.8 C)  98.2 F (36.8 C)  TempSrc:  Oral  Oral  SpO2:  94%  96%  Weight:   124 lb 1.6 oz (56.3 kg)   Height:        Intake/Output Summary (Last 24 hours) at 09/14/2017 0840 Last data filed at 09/14/2017 0000 Gross per 24 hour  Intake 610 ml  Output -  Net 610 ml   Filed Weights   09/12/17 1422 09/12/17 1432 09/14/17 0304  Weight: 138 lb (62.6 kg) 138 lb (62.6 kg) 124 lb 1.6 oz (56.3 kg)    Telemetry    Sinus rhythm in 60s- Personally Reviewed  ECG     No EKG from today, but from 09/13/2017: Sinus rhythm, rate 97 bpm.  Incomplete-complete right bundle branch block.  Cannot exclude right atrial enlargement.  Otherwise normal axis, intervals and durations- Personally Reviewed  Physical Exam   GEN: No acute distress.  Well-nourished. Neck: No JVD, no carotid bruit Cardiac:  Normal S1 and S2.  RRR, no murmurs, rubs, or gallops.  Respiratory:  Nonlabored.  Diffuse mild expiratory wheezing with no rales or rhonchi. GI: Soft, nontender, non-distended  MS: No edema; No deformity. Neuro:  Nonfocal; CN II-XII  grossly intact Psych: Normal affect   Labs    Chemistry Recent Labs  Lab 09/12/17 1454 09/12/17 1503 09/12/17 1529 09/13/17 0826 09/13/17 1519  NA 135 132*  132* 137  --  138  K 4.0 8.0*  8.0* 4.7  --  3.8  CL 102 106  106 104  --  105  CO2 22  --   --   --  25  GLUCOSE 157* 156*  156* 160*  --  212*  BUN 20 33*  33* 29*  --  18  CREATININE 0.87 0.80  0.80 0.80  --  0.96  CALCIUM 10.3  --   --   --  9.0  PROT  --   --   --  6.3*  --   ALBUMIN  --   --   --  3.2*  --   AST  --   --   --  18  --   ALT  --   --   --  16  --   ALKPHOS  --   --   --  62  --   BILITOT  --   --   --  1.1  --   GFRNONAA >60  --   --   --  >60  GFRAA >60  --   --   --  >  60  ANIONGAP 11  --   --   --  8     Hematology Recent Labs  Lab 09/12/17 1454 09/12/17 1503 09/12/17 1529 09/13/17 1519  WBC 11.5*  --   --  10.2  RBC 6.08*  --   --  5.31*  HGB 13.8 16.3*  16.3* 16.0* 12.0  HCT 44.6 48.0*  48.0* 47.0* 39.9  MCV 73.4*  --   --  75.1*  MCH 22.7*  --   --  22.6*  MCHC 30.9  --   --  30.1  RDW 14.1  --   --  14.1  PLT 339  --   --  284    Cardiac Enzymes Recent Labs  Lab 09/13/17 0251 09/13/17 0548 09/13/17 0826  TROPONINI <0.03 <0.03 <0.03    Recent Labs  Lab 09/12/17 1459 09/12/17 1804  TROPIPOC 0.00 0.01     BNP Recent Labs  Lab 09/12/17 1454  BNP 50.0     DDimer No results for input(s): DDIMER in the last 168 hours.   Radiology    Ct Head Wo Contrast  Result Date: 09/13/2017 CLINICAL DATA:  Right-sided headache after a cabinet fell on to the head. History of hypertension. EXAM: CT HEAD WITHOUT CONTRAST TECHNIQUE: Contiguous axial images were obtained from the base of the skull through the vertex without intravenous contrast. COMPARISON:  07/30/2012 FINDINGS: Brain: No evidence of acute infarction, hemorrhage, hydrocephalus, extra-axial collection or mass lesion/mass effect. Vascular: Intracranial arterial vascular calcifications are present. Skull: Normal.  Negative for fracture or focal lesion. Sinuses/Orbits: Mucosal thickening in the paranasal sinuses. No acute air-fluid levels. Mastoid air cells are clear. Other: None. IMPRESSION: No acute intracranial abnormalities. Electronically Signed   By: Burman NievesWilliam  Stevens M.D.   On: 09/13/2017 00:47   Nm Myocar Multi W/spect W/wall Motion / Ef  Result Date: 09/13/2017 CLINICAL DATA:  Acute coronary syndrome EXAM: MYOCARDIAL IMAGING WITH SPECT (REST AND PHARMACOLOGIC-STRESS) GATED LEFT VENTRICULAR WALL MOTION STUDY LEFT VENTRICULAR EJECTION FRACTION TECHNIQUE: Standard myocardial SPECT imaging was performed after resting intravenous injection of 10 mCi Tc-2912m tetrofosmin. Subsequently, intravenous infusion of Lexiscan was performed under the supervision of the Cardiology staff. At peak effect of the drug, 30 mCi Tc-7212m tetrofosmin was injected intravenously and standard myocardial SPECT imaging was performed. Quantitative gated imaging was also performed to evaluate left ventricular wall motion, and estimate left ventricular ejection fraction. COMPARISON:  None. FINDINGS: Perfusion: There is a focus of decreased activity noted in the inferior wall on stress images which appears normal on rest images concerning for ischemia. Wall Motion: Normal left ventricular wall motion. No left ventricular dilation. Left Ventricular Ejection Fraction: 73 % End diastolic volume 72 ml End systolic volume 20 ml IMPRESSION: 1. Concerning for area of inducible ischemia within the inferior wall. 2. Normal left ventricular wall motion. 3. Left ventricular ejection fraction 73% 4. Non invasive risk stratification*: Intermediate *2012 Appropriate Use Criteria for Coronary Revascularization Focused Update: J Am Coll Cardiol. 2012;59(9):857-881. http://content.dementiazones.comonlinejacc.org/article.aspx?articleid=1201161 Electronically Signed   By: Charlett NoseKevin  Dover M.D.   On: 09/13/2017 15:09   Dg Chest Portable 1 View  Result Date: 09/12/2017 CLINICAL DATA:   Severe shortness of breath with mid chest pain today. EXAM: PORTABLE CHEST 1 VIEW COMPARISON:  Radiographs 08/23/2012.  CT 07/30/2012. FINDINGS: 1441 hours. The heart size and mediastinal contours are stable. There is aortic atherosclerosis. There is mild pulmonary edema without confluent airspace opacity, pleural effusion or pneumothorax. No acute osseous findings are seen. Multiple telemetry leads overlie  the chest. IMPRESSION: Mild congestive heart failure. Electronically Signed   By: Carey Bullocks M.D.   On: 09/12/2017 14:50   Ct Angio Chest/abd/pel For Dissection W And/or W/wo  Result Date: 09/12/2017 CLINICAL DATA:  67 year old with chest pain, cardiac etiology suspected. EXAM: CT ANGIOGRAPHY CHEST, ABDOMEN AND PELVIS TECHNIQUE: Multidetector CT imaging through the chest, abdomen and pelvis was performed using the standard protocol during bolus administration of intravenous contrast. Multiplanar reconstructed images and MIPs were obtained and reviewed to evaluate the vascular anatomy. CONTRAST:  ISOVUE-370 IOPAMIDOL (ISOVUE-370) INJECTION 76% COMPARISON:  Recent CTA examination 07/30/2012 FINDINGS: CTA CHEST FINDINGS Cardiovascular: Extensive coronary artery calcifications. No evidence for aortic dissection or intramural hematoma. Normal caliber of the thoracic aorta. Great vessels are patent. Atherosclerotic plaque at the aortic arch is similar to the previous examination. Main pulmonary arteries are patent without large pulmonary emboli. Mediastinum/Nodes: No significant mediastinal or hilar lymphadenopathy. No axillary lymphadenopathy. Lungs/Pleura: Trachea and mainstem bronchi are patent. No pleural effusions. Stable 5 mm pleural-based nodule in the anterior right middle lobe on sequence 7, image 60. Again noted is a mosaic attenuation pattern throughout the lungs. In addition, there is probably atelectasis in the lower lobes. No large areas of airspace disease or lung consolidation.  Musculoskeletal: No acute bone abnormality. Review of the MIP images confirms the above findings. CTA ABDOMEN AND PELVIS FINDINGS VASCULAR Aorta: Atherosclerotic disease in the abdominal aorta without aneurysm or dissection. Celiac: Mild narrowing at the origin. Celiac trunk and main branch vessels are patent. SMA: SMA is patent with mild atherosclerotic disease. No significant stenosis. Renals: Mild narrowing at the origin of the right renal artery. Atherosclerotic plaque at origin of the left renal artery without significant stenosis. IMA: IMA is patent. Inflow: Common, external and internal iliac arteries are patent bilaterally. Mild disease in the common femoral arteries bilaterally. The proximal right femoral arteries are patent. There is severe narrowing in the proximal left SFA. Proximal left deep femoral arteries are patent. Veins: No obvious venous abnormality within the limitations of this arterial phase study. Review of the MIP images confirms the above findings. NON-VASCULAR Hepatobiliary: There appears to be a fold or phrygian cap at the gallbladder fundus containing a chronic stone. No evidence for gallbladder distension or inflammation. Normal appearance of the liver without biliary dilatation. Pancreas: Unremarkable. No pancreatic ductal dilatation or surrounding inflammatory changes. Spleen: Normal in size without focal abnormality. Adrenals/Urinary Tract: Adrenal glands are normal. Normal appearance of the kidneys without hydronephrosis or suspicious lesions. Mild distention of the urinary bladder. Stomach/Bowel: Stomach is within normal limits. Appendix appears normal. No evidence of bowel wall thickening, distention, or inflammatory changes. Lymphatic: No significant lymph node enlargement in the abdomen or pelvis. Reproductive: Uterus and bilateral adnexa are unremarkable. Other: No ascites.  Negative for free air. Musculoskeletal: No acute bone abnormality. Review of the MIP images confirms the  above findings. IMPRESSION: Vascular: No acute aortic abnormality. Specifically, no evidence for an aortic aneurysm or dissection. Extensive coronary artery calcifications. 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 Nonvascular: Persistent mosaic attenuation pattern in the lungs. Findings are nonspecific but could be related to chronic air trapping. No acute abnormality in the abdomen or pelvis. 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 No evidence for gallbladder inflammation. Electronically Signed   By: Richarda Overlie M.D.   On: 09/12/2017 17:16    Cardiac Studies    Echo 08/13/17: EF 60-65%.  GR 1 DD.  No regional wall motion normalities.  Intermediate LV  filling pressures.  Otherwise normal   Myoview 09/13/2017: (personally reviewed & agree with findings) - INTERMEDIATE RISK.  EF 73%.  Normal WM.  Partially reversible Inferior (inferoseptal, inferolateral & inferoapical) suggestive of Ischemia.    Patient Profile     67 y.o. female with hypertension hyperlipidemia and long-term smoking who presented with some typical and atypical features of chest pain but more typical features concerning for possible ACS/unstable angina. Initial evaluation included a CT angiogram of the chest looking for PE.  No evidence of PE, however there is extensive coronary calcification noted on all 3 major coronary arteries on noncontrasted images.  Based on these findings and her symptoms, initial thoughts were that she would likely benefit from cardiac catheterization, however she preferred to proceed with noninvasive testing first using nuclear stress test.  She suddenly underwent nuclear stress test reviewed above with INTERMEDIATE RISK findings of possible inferior ischemia.  Assessment & Plan    Principal Problem:   Unstable angina (HCC) Active Problems:   Coronary artery calcification seen on CAT scan   Abnormal nuclear stress test   Chest pain with high risk for cardiac etiology    Hyperlipidemia due to dietary fat intake   Essential hypertension   Smoker  Principal Problem:   Chest pain with high risk for cardiac etiology - concerning for Unstable angina (HCC)  (Coronary artery calcification seen on CAT scan/Abnormal nuclear stress test)  Concerning symptoms with now noninvasive evaluation suggesting evidence of ischemic coronary disease with chronic calcification and abnormal stress test.  Recommendation is cardiac catheterization.-->  She has been scheduled for catheterization today.  Orders written.  Precath 324 mg aspirin today, beta-blocker and statin added yesterday.  CATH CONSENT: Procedure: LEFT HEART CATHETERIZATION WITH CORONARY ANGIOGRAPHY AND POSSIBLE PERCUTANEOUS CORONARY INTERVENTION  The procedure with Risks/Benefits/Alternatives and Indications was reviewed with the patient and daughter.  All questions were answered.    Risks / Complications include, but not limited to: Death, MI, CVA/TIA, VF/VT (with defibrillation), Bradycardia (need for temporary pacer placement), contrast induced nephropathy , bleeding / bruising / hematoma / pseudoaneurysm, vascular or coronary injury (with possible emergent CT or Vascular Surgery), adverse medication reactions, infection.  Additional risks involving the use of radiation with the possibility of radiation burns and cancer were explained in detail.  The patient (and family) voice understanding and agree to proceed.           Hyperlipidemia due to dietary fat intake -started on atorvastatin yesterday   Essential hypertension -started on beta-blocker yesterday in addition to home dose lisinopril   Smoker -smoking cessation counseling provided, on NicoDerm patch.  Wheezing on exam -nebs written   For questions or updates, please contact [facility] Please consult [email or web address] for contact info under Cardiology/STEMI.      Signed, Bryan Lemma, MD  09/14/2017, 8:40 AM

## 2017-09-14 NOTE — Progress Notes (Addendum)
Family Medicine Teaching Service Daily Progress Note Intern Pager: [phone]  Patient name: Ashley HainesLeticia Moran Medical record number: [id number] Date of birth: 05/28/1950 Age: 67 y.o. Gender: female  Primary Care Provider: Kaleen MaskElkins, Wilson Oliver, MD Consultants: cardiology Code Status: full  Pt Overview and Major Events to Date:  6/17 admitted to fpts 6/18 nuclear stress test 6/19 left heart cath  Assessment and Plan: Ashley HainesLeticia Sackmann is a 67 y.o. female presenting with chest pain. PMH is significant for HTN, tobacco abuse.  Chest pain Stress test showed inferior wall ischemia. On ASA 325, lopressor, statin. To get LHC with possible stent placement this am. Will follow up cardiology recs regarding this. Appreciate their input. Trop stable at <0.03 x3. - continue metoprolol 25mg  bid - stattin when not npo - LHC with possible PCI - npo - lovenox for dvt ppx  Headache with reported head trauma Improved this am - tylenol for pain  Hypertension.  BP 112/60 this am -Continue home lisinopril 10 mg daily  Tobacco abuse.  Significant smoking history, likely has some component of undiagnosed COPD -Nicotine patch -PFTs as outpatient, anticipate will likely need controller inhaler  Pulmonary nodule.  Seen on CTA chest,  5 mm nodule on anterior right middle lobe stable from 2014. - Follow up as outpatient  FEN/GI: heart healthy PPx: lovenox  Disposition: pending clinical course  Subjective:  Anxious about cath this am. Headache resolved. No chest pain.  Objective: Temp:  [98.2 F (36.8 C)-98.3 F (36.8 C)] 98.2 F (36.8 C) (06/19 0311) Pulse Rate:  [60-102] 60 (06/19 0311) Resp:  [16-24] 16 (06/19 0311) BP: (89-148)/(47-85) 112/60 (06/19 0311) SpO2:  [92 %-96 %] 96 % (06/19 0311) Weight:  [124 lb 1.6 oz (56.3 kg)] 124 lb 1.6 oz (56.3 kg) (06/19 0304) Physical Exam: General: Lying in bed comfortably, in no acute distress, resting comfortably on room air Cardiovascular:  Regular Rate Rhythm, no murmurs/rubs/gallops, no chest pain Respiratory: Lungs clear to ausculation bilaterally Gastrointestinal: Soft, NT, ND, BS + MSK: Moving all limbs equally, no lower extremity edema Derm: Warm and dry Neuro: Alert and awake, oriented, grossly normal Psych: Appropriate affect  Laboratory: Recent Labs  Lab 09/12/17 1454 09/12/17 1503 09/12/17 1529 09/13/17 1519  WBC 11.5*  --   --  10.2  HGB 13.8 16.3*  16.3* 16.0* 12.0  HCT 44.6 48.0*  48.0* 47.0* 39.9  PLT 339  --   --  284   Recent Labs  Lab 09/12/17 1454 09/12/17 1503 09/12/17 1529 09/13/17 0826 09/13/17 1519  NA 135 132*  132* 137  --  138  K 4.0 8.0*  8.0* 4.7  --  3.8  CL 102 106  106 104  --  105  CO2 22  --   --   --  25  BUN 20 33*  33* 29*  --  18  CREATININE 0.87 0.80  0.80 0.80  --  0.96  CALCIUM 10.3  --   --   --  9.0  PROT  --   --   --  6.3*  --   BILITOT  --   --   --  1.1  --   ALKPHOS  --   --   --  62  --   ALT  --   --   --  16  --   AST  --   --   --  18  --   GLUCOSE 157* 156*  156* 160*  --  212*  Imaging/Diagnostic Tests: CLINICAL DATA:  Right-sided headache after a cabinet fell on to the head. History of hypertension.  EXAM: CT HEAD WITHOUT CONTRAST  TECHNIQUE: Contiguous axial images were obtained from the base of the skull through the vertex without intravenous contrast.  COMPARISON:  07/30/2012  FINDINGS: Brain: No evidence of acute infarction, hemorrhage, hydrocephalus, extra-axial collection or mass lesion/mass effect.  Vascular: Intracranial arterial vascular calcifications are present.  Skull: Normal. Negative for fracture or focal lesion.  Sinuses/Orbits: Mucosal thickening in the paranasal sinuses. No acute air-fluid levels. Mastoid air cells are clear.  Other: None.  IMPRESSION: No acute intracranial abnormalities.  Myrene Buddy, MD 09/14/2017, 9:18 AM PGY-1, [place] [facility] FPTS Intern pager: [phone],  text pages welcome

## 2017-09-14 NOTE — Progress Notes (Signed)
Progress Note  Patient Name: Ashley Moran Date of Encounter: 09/14/2017  Primary Care Provider: Kaleen Mask, MD Primary Cardiologist: Bryan Lemma, MD  Primary Attending: Dr. Tawanna Cooler McDiarmid -Resident Dr. Myrene Buddy   Subjective   No further chest discomfort overnight.  Very anxious  Inpatient Medications    Scheduled Meds: . atorvastatin  40 mg Oral q1800  . enoxaparin (LOVENOX) injection  40 mg Subcutaneous QHS  . ipratropium-albuterol  3 mL Nebulization Once  . lisinopril  10 mg Oral QPM  . metoprolol tartrate  25 mg Oral BID  . nicotine  14 mg Transdermal Daily   Continuous Infusions:  PRN Meds: acetaminophen, gi cocktail, nitroGLYCERIN, ondansetron (ZOFRAN) IV   Vital Signs    Vitals:   09/13/17 1728 09/13/17 2000 09/14/17 0304 09/14/17 0311  BP: (!) 148/74 134/79  112/60  Pulse:  72  60  Resp:  (!) 24  16  Temp:  98.2 F (36.8 C)  98.2 F (36.8 C)  TempSrc:  Oral  Oral  SpO2:  94%  96%  Weight:   124 lb 1.6 oz (56.3 kg)   Height:        Intake/Output Summary (Last 24 hours) at 09/14/2017 0840 Last data filed at 09/14/2017 0000 Gross per 24 hour  Intake 610 ml  Output -  Net 610 ml   Filed Weights   09/12/17 1422 09/12/17 1432 09/14/17 0304  Weight: 138 lb (62.6 kg) 138 lb (62.6 kg) 124 lb 1.6 oz (56.3 kg)    Telemetry    Sinus rhythm in 60s- Personally Reviewed  ECG     No EKG from today, but from 09/13/2017: Sinus rhythm, rate 97 bpm.  Incomplete-complete right bundle branch block.  Cannot exclude right atrial enlargement.  Otherwise normal axis, intervals and durations- Personally Reviewed  Physical Exam   GEN: No acute distress.  Well-nourished. Neck: No JVD, no carotid bruit Cardiac:  Normal S1 and S2.  RRR, no murmurs, rubs, or gallops.  Respiratory:  Nonlabored.  Diffuse mild expiratory wheezing with no rales or rhonchi. GI: Soft, nontender, non-distended  MS: No edema; No deformity. Neuro:  Nonfocal; CN II-XII  grossly intact Psych: Normal affect   Labs    Chemistry Recent Labs  Lab 09/12/17 1454 09/12/17 1503 09/12/17 1529 09/13/17 0826 09/13/17 1519  NA 135 132*  132* 137  --  138  K 4.0 8.0*  8.0* 4.7  --  3.8  CL 102 106  106 104  --  105  CO2 22  --   --   --  25  GLUCOSE 157* 156*  156* 160*  --  212*  BUN 20 33*  33* 29*  --  18  CREATININE 0.87 0.80  0.80 0.80  --  0.96  CALCIUM 10.3  --   --   --  9.0  PROT  --   --   --  6.3*  --   ALBUMIN  --   --   --  3.2*  --   AST  --   --   --  18  --   ALT  --   --   --  16  --   ALKPHOS  --   --   --  62  --   BILITOT  --   --   --  1.1  --   GFRNONAA >60  --   --   --  >60  GFRAA >60  --   --   --  >  60  ANIONGAP 11  --   --   --  8     Hematology Recent Labs  Lab 09/12/17 1454 09/12/17 1503 09/12/17 1529 09/13/17 1519  WBC 11.5*  --   --  10.2  RBC 6.08*  --   --  5.31*  HGB 13.8 16.3*  16.3* 16.0* 12.0  HCT 44.6 48.0*  48.0* 47.0* 39.9  MCV 73.4*  --   --  75.1*  MCH 22.7*  --   --  22.6*  MCHC 30.9  --   --  30.1  RDW 14.1  --   --  14.1  PLT 339  --   --  284    Cardiac Enzymes Recent Labs  Lab 09/13/17 0251 09/13/17 0548 09/13/17 0826  TROPONINI <0.03 <0.03 <0.03    Recent Labs  Lab 09/12/17 1459 09/12/17 1804  TROPIPOC 0.00 0.01     BNP Recent Labs  Lab 09/12/17 1454  BNP 50.0     DDimer No results for input(s): DDIMER in the last 168 hours.   Radiology    Ct Head Wo Contrast  Result Date: 09/13/2017 CLINICAL DATA:  Right-sided headache after a cabinet fell on to the head. History of hypertension. EXAM: CT HEAD WITHOUT CONTRAST TECHNIQUE: Contiguous axial images were obtained from the base of the skull through the vertex without intravenous contrast. COMPARISON:  07/30/2012 FINDINGS: Brain: No evidence of acute infarction, hemorrhage, hydrocephalus, extra-axial collection or mass lesion/mass effect. Vascular: Intracranial arterial vascular calcifications are present. Skull: Normal.  Negative for fracture or focal lesion. Sinuses/Orbits: Mucosal thickening in the paranasal sinuses. No acute air-fluid levels. Mastoid air cells are clear. Other: None. IMPRESSION: No acute intracranial abnormalities. Electronically Signed   By: Burman NievesWilliam  Stevens M.D.   On: 09/13/2017 00:47   Nm Myocar Multi W/spect W/wall Motion / Ef  Result Date: 09/13/2017 CLINICAL DATA:  Acute coronary syndrome EXAM: MYOCARDIAL IMAGING WITH SPECT (REST AND PHARMACOLOGIC-STRESS) GATED LEFT VENTRICULAR WALL MOTION STUDY LEFT VENTRICULAR EJECTION FRACTION TECHNIQUE: Standard myocardial SPECT imaging was performed after resting intravenous injection of 10 mCi Tc-2912m tetrofosmin. Subsequently, intravenous infusion of Lexiscan was performed under the supervision of the Cardiology staff. At peak effect of the drug, 30 mCi Tc-7212m tetrofosmin was injected intravenously and standard myocardial SPECT imaging was performed. Quantitative gated imaging was also performed to evaluate left ventricular wall motion, and estimate left ventricular ejection fraction. COMPARISON:  None. FINDINGS: Perfusion: There is a focus of decreased activity noted in the inferior wall on stress images which appears normal on rest images concerning for ischemia. Wall Motion: Normal left ventricular wall motion. No left ventricular dilation. Left Ventricular Ejection Fraction: 73 % End diastolic volume 72 ml End systolic volume 20 ml IMPRESSION: 1. Concerning for area of inducible ischemia within the inferior wall. 2. Normal left ventricular wall motion. 3. Left ventricular ejection fraction 73% 4. Non invasive risk stratification*: Intermediate *2012 Appropriate Use Criteria for Coronary Revascularization Focused Update: J Am Coll Cardiol. 2012;59(9):857-881. http://content.dementiazones.comonlinejacc.org/article.aspx?articleid=1201161 Electronically Signed   By: Charlett NoseKevin  Dover M.D.   On: 09/13/2017 15:09   Dg Chest Portable 1 View  Result Date: 09/12/2017 CLINICAL DATA:   Severe shortness of breath with mid chest pain today. EXAM: PORTABLE CHEST 1 VIEW COMPARISON:  Radiographs 08/23/2012.  CT 07/30/2012. FINDINGS: 1441 hours. The heart size and mediastinal contours are stable. There is aortic atherosclerosis. There is mild pulmonary edema without confluent airspace opacity, pleural effusion or pneumothorax. No acute osseous findings are seen. Multiple telemetry leads overlie  the chest. IMPRESSION: Mild congestive heart failure. Electronically Signed   By: Carey Bullocks M.D.   On: 09/12/2017 14:50   Ct Angio Chest/abd/pel For Dissection W And/or W/wo  Result Date: 09/12/2017 CLINICAL DATA:  67 year old with chest pain, cardiac etiology suspected. EXAM: CT ANGIOGRAPHY CHEST, ABDOMEN AND PELVIS TECHNIQUE: Multidetector CT imaging through the chest, abdomen and pelvis was performed using the standard protocol during bolus administration of intravenous contrast. Multiplanar reconstructed images and MIPs were obtained and reviewed to evaluate the vascular anatomy. CONTRAST:  ISOVUE-370 IOPAMIDOL (ISOVUE-370) INJECTION 76% COMPARISON:  Recent CTA examination 07/30/2012 FINDINGS: CTA CHEST FINDINGS Cardiovascular: Extensive coronary artery calcifications. No evidence for aortic dissection or intramural hematoma. Normal caliber of the thoracic aorta. Great vessels are patent. Atherosclerotic plaque at the aortic arch is similar to the previous examination. Main pulmonary arteries are patent without large pulmonary emboli. Mediastinum/Nodes: No significant mediastinal or hilar lymphadenopathy. No axillary lymphadenopathy. Lungs/Pleura: Trachea and mainstem bronchi are patent. No pleural effusions. Stable 5 mm pleural-based nodule in the anterior right middle lobe on sequence 7, image 60. Again noted is a mosaic attenuation pattern throughout the lungs. In addition, there is probably atelectasis in the lower lobes. No large areas of airspace disease or lung consolidation.  Musculoskeletal: No acute bone abnormality. Review of the MIP images confirms the above findings. CTA ABDOMEN AND PELVIS FINDINGS VASCULAR Aorta: Atherosclerotic disease in the abdominal aorta without aneurysm or dissection. Celiac: Mild narrowing at the origin. Celiac trunk and main branch vessels are patent. SMA: SMA is patent with mild atherosclerotic disease. No significant stenosis. Renals: Mild narrowing at the origin of the right renal artery. Atherosclerotic plaque at origin of the left renal artery without significant stenosis. IMA: IMA is patent. Inflow: Common, external and internal iliac arteries are patent bilaterally. Mild disease in the common femoral arteries bilaterally. The proximal right femoral arteries are patent. There is severe narrowing in the proximal left SFA. Proximal left deep femoral arteries are patent. Veins: No obvious venous abnormality within the limitations of this arterial phase study. Review of the MIP images confirms the above findings. NON-VASCULAR Hepatobiliary: There appears to be a fold or phrygian cap at the gallbladder fundus containing a chronic stone. No evidence for gallbladder distension or inflammation. Normal appearance of the liver without biliary dilatation. Pancreas: Unremarkable. No pancreatic ductal dilatation or surrounding inflammatory changes. Spleen: Normal in size without focal abnormality. Adrenals/Urinary Tract: Adrenal glands are normal. Normal appearance of the kidneys without hydronephrosis or suspicious lesions. Mild distention of the urinary bladder. Stomach/Bowel: Stomach is within normal limits. Appendix appears normal. No evidence of bowel wall thickening, distention, or inflammatory changes. Lymphatic: No significant lymph node enlargement in the abdomen or pelvis. Reproductive: Uterus and bilateral adnexa are unremarkable. Other: No ascites.  Negative for free air. Musculoskeletal: No acute bone abnormality. Review of the MIP images confirms the  above findings. IMPRESSION: Vascular: No acute aortic abnormality. Specifically, no evidence for an aortic aneurysm or dissection. Extensive coronary artery calcifications. 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 Nonvascular: Persistent mosaic attenuation pattern in the lungs. Findings are nonspecific but could be related to chronic air trapping. No acute abnormality in the abdomen or pelvis. 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 No evidence for gallbladder inflammation. Electronically Signed   By: Richarda Overlie M.D.   On: 09/12/2017 17:16    Cardiac Studies    Echo 08/13/17: EF 60-65%.  GR 1 DD.  No regional wall motion normalities.  Intermediate LV  filling pressures.  Otherwise normal   Myoview 09/13/2017: (personally reviewed & agree with findings) - INTERMEDIATE RISK.  EF 73%.  Normal WM.  Partially reversible Inferior (inferoseptal, inferolateral & inferoapical) suggestive of Ischemia.    Patient Profile     67 y.o. female with hypertension hyperlipidemia and long-term smoking who presented with some typical and atypical features of chest pain but more typical features concerning for possible ACS/unstable angina. Initial evaluation included a CT angiogram of the chest looking for PE.  No evidence of PE, however there is extensive coronary calcification noted on all 3 major coronary arteries on noncontrasted images.  Based on these findings and her symptoms, initial thoughts were that she would likely benefit from cardiac catheterization, however she preferred to proceed with noninvasive testing first using nuclear stress test.  She suddenly underwent nuclear stress test reviewed above with INTERMEDIATE RISK findings of possible inferior ischemia.  Assessment & Plan    Principal Problem:   Unstable angina (HCC) Active Problems:   Coronary artery calcification seen on CAT scan   Abnormal nuclear stress test   Chest pain with high risk for cardiac etiology    Hyperlipidemia due to dietary fat intake   Essential hypertension   Smoker  Principal Problem:   Chest pain with high risk for cardiac etiology - concerning for Unstable angina (HCC)  (Coronary artery calcification seen on CAT scan/Abnormal nuclear stress test)  Concerning symptoms with now noninvasive evaluation suggesting evidence of ischemic coronary disease with chronic calcification and abnormal stress test.  Recommendation is cardiac catheterization.-->  She has been scheduled for catheterization today.  Orders written.  Precath 324 mg aspirin today, beta-blocker and statin added yesterday.  CATH CONSENT: Procedure: LEFT HEART CATHETERIZATION WITH CORONARY ANGIOGRAPHY AND POSSIBLE PERCUTANEOUS CORONARY INTERVENTION  The procedure with Risks/Benefits/Alternatives and Indications was reviewed with the patient and daughter.  All questions were answered.    Risks / Complications include, but not limited to: Death, MI, CVA/TIA, VF/VT (with defibrillation), Bradycardia (need for temporary pacer placement), contrast induced nephropathy , bleeding / bruising / hematoma / pseudoaneurysm, vascular or coronary injury (with possible emergent CT or Vascular Surgery), adverse medication reactions, infection.  Additional risks involving the use of radiation with the possibility of radiation burns and cancer were explained in detail.  The patient (and family) voice understanding and agree to proceed.           Hyperlipidemia due to dietary fat intake -started on atorvastatin yesterday   Essential hypertension -started on beta-blocker yesterday in addition to home dose lisinopril   Smoker -smoking cessation counseling provided, on NicoDerm patch.  Wheezing on exam -nebs written   For questions or updates, please contact [facility] Please consult [email or web address] for contact info under Cardiology/STEMI.      Signed, Bryan Lemma, MD  09/14/2017, 8:40 AM

## 2017-09-14 NOTE — Interval H&P Note (Signed)
Cath Lab Visit (complete for each Cath Lab visit)  Clinical Evaluation Leading to the Procedure:   ACS: No.  Non-ACS:    Anginal Classification: CCS III  Anti-ischemic medical therapy: Minimal Therapy (1 class of medications)  Non-Invasive Test Results: Intermediate-risk stress test findings: cardiac mortality 1-3%/year  Prior CABG: No previous CABG      History and Physical Interval Note:  09/14/2017 5:00 PM  Ashley Moran  has presented today for surgery, with the diagnosis of positive stress - angina  The various methods of treatment have been discussed with the patient and family. After consideration of risks, benefits and other options for treatment, the patient has consented to  Procedure(s): LEFT HEART CATH AND CORONARY ANGIOGRAPHY (N/A) as a surgical intervention .  The patient's history has been reviewed, patient examined, no change in status, stable for surgery.  I have reviewed the patient's chart and labs.  Questions were answered to the patient's satisfaction.     Nicki Guadalajarahomas Kelly

## 2017-09-15 ENCOUNTER — Other Ambulatory Visit: Payer: Self-pay | Admitting: Cardiology

## 2017-09-15 ENCOUNTER — Encounter (HOSPITAL_COMMUNITY): Payer: Self-pay | Admitting: Cardiovascular Disease

## 2017-09-15 DIAGNOSIS — R9439 Abnormal result of other cardiovascular function study: Secondary | ICD-10-CM | POA: Diagnosis not present

## 2017-09-15 DIAGNOSIS — I25110 Atherosclerotic heart disease of native coronary artery with unstable angina pectoris: Secondary | ICD-10-CM | POA: Diagnosis present

## 2017-09-15 DIAGNOSIS — I4510 Unspecified right bundle-branch block: Secondary | ICD-10-CM | POA: Diagnosis present

## 2017-09-15 DIAGNOSIS — R079 Chest pain, unspecified: Secondary | ICD-10-CM | POA: Diagnosis present

## 2017-09-15 DIAGNOSIS — F17210 Nicotine dependence, cigarettes, uncomplicated: Secondary | ICD-10-CM | POA: Diagnosis present

## 2017-09-15 DIAGNOSIS — Y9289 Other specified places as the place of occurrence of the external cause: Secondary | ICD-10-CM | POA: Diagnosis not present

## 2017-09-15 DIAGNOSIS — Z955 Presence of coronary angioplasty implant and graft: Secondary | ICD-10-CM

## 2017-09-15 DIAGNOSIS — F17200 Nicotine dependence, unspecified, uncomplicated: Secondary | ICD-10-CM | POA: Diagnosis not present

## 2017-09-15 DIAGNOSIS — I161 Hypertensive emergency: Secondary | ICD-10-CM | POA: Diagnosis present

## 2017-09-15 DIAGNOSIS — E7849 Other hyperlipidemia: Secondary | ICD-10-CM | POA: Diagnosis not present

## 2017-09-15 DIAGNOSIS — S0990XA Unspecified injury of head, initial encounter: Secondary | ICD-10-CM | POA: Diagnosis present

## 2017-09-15 DIAGNOSIS — W208XXA Other cause of strike by thrown, projected or falling object, initial encounter: Secondary | ICD-10-CM | POA: Diagnosis present

## 2017-09-15 DIAGNOSIS — I10 Essential (primary) hypertension: Secondary | ICD-10-CM | POA: Diagnosis present

## 2017-09-15 DIAGNOSIS — E785 Hyperlipidemia, unspecified: Secondary | ICD-10-CM | POA: Diagnosis present

## 2017-09-15 DIAGNOSIS — I25119 Atherosclerotic heart disease of native coronary artery with unspecified angina pectoris: Secondary | ICD-10-CM | POA: Clinically undetermined

## 2017-09-15 DIAGNOSIS — R911 Solitary pulmonary nodule: Secondary | ICD-10-CM | POA: Diagnosis present

## 2017-09-15 LAB — CBC
HEMATOCRIT: 39.1 % (ref 36.0–46.0)
HEMOGLOBIN: 11.6 g/dL — AB (ref 12.0–15.0)
MCH: 22.4 pg — AB (ref 26.0–34.0)
MCHC: 29.7 g/dL — ABNORMAL LOW (ref 30.0–36.0)
MCV: 75.3 fL — ABNORMAL LOW (ref 78.0–100.0)
Platelets: 296 10*3/uL (ref 150–400)
RBC: 5.19 MIL/uL — AB (ref 3.87–5.11)
RDW: 14.3 % (ref 11.5–15.5)
WBC: 9.1 10*3/uL (ref 4.0–10.5)

## 2017-09-15 LAB — BASIC METABOLIC PANEL
Anion gap: 7 (ref 5–15)
BUN: 18 mg/dL (ref 6–20)
CO2: 24 mmol/L (ref 22–32)
Calcium: 8.5 mg/dL — ABNORMAL LOW (ref 8.9–10.3)
Chloride: 111 mmol/L (ref 101–111)
Creatinine, Ser: 0.93 mg/dL (ref 0.44–1.00)
GFR calc non Af Amer: >60 mL/min (ref >60)
Glucose, Bld: 195 mg/dL — ABNORMAL HIGH (ref 65–99)
POTASSIUM: 3.6 mmol/L (ref 3.5–5.1)
Sodium: 142 mmol/L (ref 135–145)

## 2017-09-15 MED ORDER — ATORVASTATIN CALCIUM 40 MG PO TABS: 40.0000 mg | ORAL_TABLET | Freq: Every day | ORAL | 0 refills | Status: DC

## 2017-09-15 MED ORDER — NITROGLYCERIN 0.4 MG SL SUBL: 0.4000 mg | SUBLINGUAL_TABLET | SUBLINGUAL | 0 refills | Status: DC | PRN

## 2017-09-15 MED ORDER — NICOTINE 14 MG/24HR TD PT24: 14.0000 mg | MEDICATED_PATCH | Freq: Every day | TRANSDERMAL | 0 refills | Status: DC

## 2017-09-15 MED ORDER — LISINOPRIL 10 MG PO TABS: 10.0000 mg | ORAL_TABLET | Freq: Every evening | ORAL | 0 refills | Status: DC

## 2017-09-15 MED ORDER — ASPIRIN 81 MG PO CHEW: 81.0000 mg | CHEWABLE_TABLET | Freq: Every day | ORAL | 0 refills | Status: DC

## 2017-09-15 MED ORDER — METOPROLOL TARTRATE 25 MG PO TABS: 25.0000 mg | ORAL_TABLET | Freq: Two times a day (BID) | ORAL | 0 refills | Status: DC

## 2017-09-15 MED ORDER — TICAGRELOR 90 MG PO TABS: 90.0000 mg | ORAL_TABLET | Freq: Two times a day (BID) | ORAL | 0 refills | Status: DC

## 2017-09-15 MED FILL — Heparin Sod (Porcine)-NaCl IV Soln 1000 Unit/500ML-0.9%: INTRAVENOUS | Qty: 500 | Status: AC

## 2017-09-15 NOTE — Progress Notes (Signed)
Family Medicine Teaching Service Daily Progress Note Intern Pager: [phone]  Patient name: Ashley HainesLeticia Regnier Medical record number: [id number] Date of birth: [birth year] Age: 67 y.o. Gender: female  Primary Care Provider: Kaleen MaskElkins, Wilson Oliver, MD Consultants: cardiology Code Status: full  Pt Overview and Major Events to Date:  6/17 admitted to fpts 6/18 nuclear stress test 6/19 left heart cath  Assessment and Plan: Ashley Moran is a 67 y.o. female presenting with chest pain. PMH is significant for HTN, tobacco abuse.  Chest pain S/P LHC on 6/19. PCI placed in RCA. Still with severe disease to left Circumflex. Plan is for staged atherectomy vs medical management of this vessel. Patient to be on brillinta and aspirin for at least one year. Appreciate cardiology help. - aspirin, brillinta - continue beta blocker - lisinopril 10mg  - nitrostat 0.4mg   Headache with reported head trauma Improved this am - tylenol for pain  Hypertension.  BP 143/46 this am -Continue home lisinopril 10 mg daily  Tobacco abuse.  Significant smoking history, likely has some component of undiagnosed COPD -Nicotine patch -PFTs as outpatient, anticipate will likely need controller inhaler  Pulmonary nodule.  Seen on CTA chest,  5 mm nodule on anterior right middle lobe stable from 2014. - Follow up as outpatient  FEN/GI: heart healthy PPx: lovenox  Disposition: pending clinical course  Subjective:  Anxious about cath this am. Headache resolved. No chest pain.  Objective: Temp:  [98 F (36.7 C)-98.6 F (37 C)] 98 F (36.7 C) (06/20 0700) Pulse Rate:  [0-246] 65 (06/20 0700) Resp:  [0-70] 16 (06/20 0700) BP: (95-205)/(37-126) 143/46 (06/20 0700) SpO2:  [0 %-100 %] 99 % (06/20 0700) Weight:  [125 lb 10.6 oz (57 kg)] 125 lb 10.6 oz (57 kg) (06/20 0558) Physical Exam: General: Lying in bed comfortably, in no acute distress, resting comfortably on room air Cardiovascular: RRR, no m/r/g, no  chest pain Respiratory: Lungs clear to ausculation bilaterally Gastrointestinal: Soft, NT, ND, BS + MSK: Moving all limbs equally, no lower extremity edema Derm: Warm and dry Neuro: Alert and awake, oriented, grossly normal Psych: Appropriate affect  Laboratory: Recent Labs  Lab 09/12/17 1454  09/12/17 1529 09/13/17 1519 09/15/17 0223  WBC 11.5*  --   --  10.2 9.1  HGB 13.8   < > 16.0* 12.0 11.6*  HCT 44.6   < > 47.0* 39.9 39.1  PLT 339  --   --  284 296   < > = values in this interval not displayed.   Recent Labs  Lab 09/12/17 1454  09/12/17 1529 09/13/17 0826 09/13/17 1519 09/15/17 0223  NA 135   < > 137  --  138 142  K 4.0   < > 4.7  --  3.8 3.6  CL 102   < > 104  --  105 111  CO2 22  --   --   --  25 24  BUN 20   < > 29*  --  18 18  CREATININE 0.87   < > 0.80  --  0.96 0.93  CALCIUM 10.3  --   --   --  9.0 8.5*  PROT  --   --   --  6.3*  --   --   BILITOT  --   --   --  1.1  --   --   ALKPHOS  --   --   --  62  --   --   ALT  --   --   --  16  --   --   AST  --   --   --  18  --   --   GLUCOSE 157*   < > 160*  --  212* 195*   < > = values in this interval not displayed.    Imaging/Diagnostic Tests: CLINICAL DATA:  Right-sided headache after a cabinet fell on to the head. History of hypertension.  EXAM: CT HEAD WITHOUT CONTRAST  TECHNIQUE: Contiguous axial images were obtained from the base of the skull through the vertex without intravenous contrast.  COMPARISON:  07/30/2012  FINDINGS: Brain: No evidence of acute infarction, hemorrhage, hydrocephalus, extra-axial collection or mass lesion/mass effect.  Vascular: Intracranial arterial vascular calcifications are present.  Skull: Normal. Negative for fracture or focal lesion.  Sinuses/Orbits: Mucosal thickening in the paranasal sinuses. No acute air-fluid levels. Mastoid air cells are clear.  Other: None.  IMPRESSION: No acute intracranial abnormalities.  Myrene Buddy,  MD 09/15/2017, 11:10 AM PGY-1, [facility] Family Medicine FPTS Intern pager: [phone], text pages welcome

## 2017-09-15 NOTE — Progress Notes (Signed)
Pt being discharged home via wheelchair with family. Pt alert and oriented x4. VSS. Pt c/o no pain at this time. No signs of respiratory distress. Education complete and care plans resolved. IV removed with catheter intact and pt tolerated well. No further issues at this time. Pt to follow up with PCP. Kinslie Hove R, RN 

## 2017-09-15 NOTE — Telephone Encounter (Signed)
New Message    *STAT* If patient is at the pharmacy, call can be transferred to refill team.   1. Which medications need to be refilled? (please list name of each medication and dose if known) diclofenac (VOLTAREN) 75 MG EC tablet and  lisinopril (PRINIVIL,ZESTRIL) tablet 10 mg    2. Which pharmacy/location (including street and city if local pharmacy) is medication to be sent to? [facility] [place]  3. Do they need a 30 day or 90 day supply? 30 DAY    Patient daughter is calling on behalf of mother. She states the mother was just discharged from the hospital today and these two medications was not sent to the pharmacy. Please send.

## 2017-09-15 NOTE — Progress Notes (Signed)
Progress Note  Patient Name: Ashley Moran Date of Encounter: 09/15/2017  Primary Care Provider: Kaleen Mask, MD Primary Cardiologist: Bryan Lemma, MD  Primary Attending: Dr. Tawanna Cooler McDiarmid -Resident Dr. Myrene Buddy   Subjective   Underwent cardiac catheter PCI yesterday. Had a rough night last night with bed rest and GERD symptoms.  No further anginal type chest pain.  Has been walking around the room, but not yet walking in the hall.   Inpatient Medications    Scheduled Meds: . aspirin  81 mg Oral Daily  . atorvastatin  40 mg Oral q1800  . enoxaparin (LOVENOX) injection  40 mg Subcutaneous QHS  . ipratropium-albuterol  3 mL Nebulization Once  . lisinopril  10 mg Oral QPM  . metoprolol tartrate  25 mg Oral BID  . nicotine  14 mg Transdermal Daily  . sodium chloride flush  3 mL Intravenous Q12H  . ticagrelor  90 mg Oral BID   Continuous Infusions: . sodium chloride 75 mL/hr at 09/15/17 0730  . sodium chloride     PRN Meds: sodium chloride, acetaminophen, diazepam, gi cocktail, morphine injection, nitroGLYCERIN, ondansetron (ZOFRAN) IV, ondansetron (ZOFRAN) IV, sodium chloride flush, zolpidem   Vital Signs    Vitals:   09/15/17 0550 09/15/17 0555 09/15/17 0558 09/15/17 0700  BP: (!) 98/47 112/69  (!) 143/46  Pulse:    65  Resp: (!) 25 18  16   Temp:    98 F (36.7 C)  TempSrc:    Oral  SpO2: 95% 94%  99%  Weight:   125 lb 10.6 oz (57 kg)   Height:        Intake/Output Summary (Last 24 hours) at 09/15/2017 0859 Last data filed at 09/15/2017 0630 Gross per 24 hour  Intake 1471.21 ml  Output 700 ml  Net 771.21 ml   Filed Weights   09/12/17 1432 09/14/17 0304 09/15/17 0558  Weight: 138 lb (62.6 kg) 124 lb 1.6 oz (56.3 kg) 125 lb 10.6 oz (57 kg)    Telemetry    Maintaining sinus rhythm in 60s- Personally Reviewed  ECG     Sinus Bradycardia - 59, RBBB, ~ bi-atrial enlargement-- Personally Reviewed  Physical Exam   Physical Exam    Constitutional: She is oriented to person, place, and time. She appears well-developed and well-nourished. No distress.  HENT:  Head: Normocephalic and atraumatic.  Neck: No JVD present.  Cardiovascular: Normal rate, regular rhythm, normal heart sounds and intact distal pulses. Exam reveals no gallop and no friction rub.  No murmur heard. Pulmonary/Chest: Effort normal. No respiratory distress.  Diffuse mild crackles but no rales or rhonchi.  Abdominal: Soft. Bowel sounds are normal. She exhibits no distension. There is no tenderness. There is no rebound.  Musculoskeletal: Normal range of motion. She exhibits no edema.  Neurological: She is alert and oriented to person, place, and time.  Psychiatric: She has a normal mood and affect. Her behavior is normal. Judgment and thought content normal.  Vitals reviewed.   Labs    Chemistry Recent Labs  Lab 09/12/17 1454  09/12/17 1529 09/13/17 0826 09/13/17 1519 09/15/17 0223  NA 135   < > 137  --  138 142  K 4.0   < > 4.7  --  3.8 3.6  CL 102   < > 104  --  105 111  CO2 22  --   --   --  25 24  GLUCOSE 157*   < > 160*  --  212* 195*  BUN 20   < > 29*  --  18 18  CREATININE 0.87   < > 0.80  --  0.96 0.93  CALCIUM 10.3  --   --   --  9.0 8.5*  PROT  --   --   --  6.3*  --   --   ALBUMIN  --   --   --  3.2*  --   --   AST  --   --   --  18  --   --   ALT  --   --   --  16  --   --   ALKPHOS  --   --   --  62  --   --   BILITOT  --   --   --  1.1  --   --   GFRNONAA >60  --   --   --  >60 >60  GFRAA >60  --   --   --  >60 >60  ANIONGAP 11  --   --   --  8 7   < > = values in this interval not displayed.     Hematology Recent Labs  Lab 09/12/17 1454  09/12/17 1529 09/13/17 1519 09/15/17 0223  WBC 11.5*  --   --  10.2 9.1  RBC 6.08*  --   --  5.31* 5.19*  HGB 13.8   < > 16.0* 12.0 11.6*  HCT 44.6   < > 47.0* 39.9 39.1  MCV 73.4*  --   --  75.1* 75.3*  MCH 22.7*  --   --  22.6* 22.4*  MCHC 30.9  --   --  30.1 29.7*  RDW  14.1  --   --  14.1 14.3  PLT 339  --   --  284 296   < > = values in this interval not displayed.    Cardiac Enzymes Recent Labs  Lab 09/13/17 0251 09/13/17 0548 09/13/17 0826 09/14/17 2113  TROPONINI <0.03 <0.03 <0.03 <0.03    Recent Labs  Lab 09/12/17 1459 09/12/17 1804  TROPIPOC 0.00 0.01     BNP Recent Labs  Lab 09/12/17 1454  BNP 50.0     DDimer No results for input(s): DDIMER in the last 168 hours.   Radiology    Nm Myocar Multi W/spect W/wall Motion / Ef  Result Date: 09/13/2017 CLINICAL DATA:  Acute coronary syndrome EXAM: MYOCARDIAL IMAGING WITH SPECT (REST AND PHARMACOLOGIC-STRESS) GATED LEFT VENTRICULAR WALL MOTION STUDY LEFT VENTRICULAR EJECTION FRACTION TECHNIQUE: Standard myocardial SPECT imaging was performed after resting intravenous injection of 10 mCi Tc-6567m tetrofosmin. Subsequently, intravenous infusion of Lexiscan was performed under the supervision of the Cardiology staff. At peak effect of the drug, 30 mCi Tc-8967m tetrofosmin was injected intravenously and standard myocardial SPECT imaging was performed. Quantitative gated imaging was also performed to evaluate left ventricular wall motion, and estimate left ventricular ejection fraction. COMPARISON:  None. FINDINGS: Perfusion: There is a focus of decreased activity noted in the inferior wall on stress images which appears normal on rest images concerning for ischemia. Wall Motion: Normal left ventricular wall motion. No left ventricular dilation. Left Ventricular Ejection Fraction: 73 % End diastolic volume 72 ml End systolic volume 20 ml IMPRESSION: 1. Concerning for area of inducible ischemia within the inferior wall. 2. Normal left ventricular wall motion. 3. Left ventricular ejection fraction 73% 4. Non invasive risk stratification*: Intermediate *2012 Appropriate Use Criteria for Coronary Revascularization  Focused Update: J Am Coll Cardiol. 2012;59(9):857-881.  [email or web address]?[email or web address] Electronically Signed   By: Charlett Nose M.D.   On: 09/13/2017 15:09    Cardiac Studies    Echo 08/13/17: EF 60-65%.  GR 1 DD.  No regional wall motion normalities.  Intermediate LV filling pressures.  Otherwise normal   Myoview 09/13/2017: (personally reviewed & agree with findings) - INTERMEDIATE RISK.  EF 73%.  Normal WM.  Partially reversible Inferior (inferoseptal, inferolateral & inferoapical) suggestive of Ischemia.   CARDIAC CATH-PCI September 14, 2017:  Successful percutaneous coronary intervention to the RCA with ultimate insertion of a 2.5 x 26 mm Resolute Onyx DES stent with the stenoses being reduced to 0%.   Prox RCA lesion is 50% stenosed. Mid RCA lesion is 90% stenosed.  A DES (Resolute Onyx) 2.5 x 26 mm stent was successfully placed. Post intervention, there is a 0% residual stenosis.  Test usually capital prox Cx to Mid Cx lesion is 80% stenosed. Mid Cx lesion is 75% stenosed. Dist Cx lesion is 70% stenosed.  Prox LAD to Mid LAD lesion is 50% stenosed.  Mid LAD lesion is 40% stenosed.  Post intervention, th taking additional ere is a 0% residual stenosis.   Diagnostic Diagram      Post-Intervention Diagram       RECOMMENDATION:  DAPT for minimum of 1 year.  Patient will be started on medical therapy for concomitant CAD with plans for staged PCI with possible atherectomy to the left circumflex vessel.  High potency statin therapy.  Tobacco cessation is imperative.   Patient Profile     67 y.o. female with hypertension hyperlipidemia and long-term smoking who presented with some typical and atypical features of chest pain but more typical features concerning for possible ACS/unstable angina. Initial evaluation included a CT angiogram of the chest looking for PE.  No evidence of PE, however there is extensive coronary calcification noted on all 3 major coronary arteries on noncontrasted images.  Based on these  findings and her symptoms, initial thoughts were that she would likely benefit from cardiac catheterization, however she preferred to proceed with noninvasive testing first using nuclear stress test.  She suddenly underwent nuclear stress test reviewed above with INTERMEDIATE RISK findings of possible inferior ischemia.  Cardiac cath revealed severe two-vessel disease involving RCA and circumflex.  Underwent PCI to RCA on 09/14/2017.  Assessment & Plan    Principal Problem:   Unstable angina (HCC) Active Problems:   Coronary artery calcification seen on CAT scan   Abnormal nuclear stress test   Coronary artery disease involving native coronary artery of native heart with unstable angina pectoris (HCC)   Chest pain with high risk for cardiac etiology   Hyperlipidemia due to dietary fat intake   Essential hypertension   Smoker   Presence of drug coated stent in right coronary artery  Principal Problem:   Unstable angina (HCC) / (Coronary artery calcification seen on CAT scan/Abnormal nuclear stress test) --> Coronary artery disease involving native coronary artery of native heart with unstable angina pectoris (HCC)/  Presence of drug coated stent in right coronary artery  Cardiac catheter essentially revealed two-vessel disease involving the RCA and circumflex.  Status post PCI of the RCA yesterday and would likely need staged PCI of the circumflex with atherectomy.  Tentatively scheduled for next Thursday, June 27.  I will arrange for her to be seen in my clinic on Tuesday, June 25 (1020 AM) to discuss options are either continued medical management versus staged  atherectomy-PCI on June 27.  Now on aspirin plus Brilinta.  Low-dose beta-blocker added to existing ACE inhibitor.  Restart atorvastatin        Hyperlipidemia due to dietary fat intake -restarted atorvastatin    Essential hypertension -relatively controlled on ACE inhibitor and newly added beta-blocker.  Interestingly, this  morning BP was 170/90 mmHg -- will dose Lopressor & reassess  (may need to add AM dose Amlodipine 2.5 mg -- will also help as antianginal)    Smoker -smoking cessation counseling.  NicoDerm patch.  Wheezing on exam -nebs written  Otherwise okay for discharge from cardiac standpoint -provided her blood pressure is controlled.   With the existing CAD and her recovery from the current PCI, would need 5 to 7 days out of work for recuperation -that would put her able to return work to work on Monday, July 1. --I would not want her going back to work until he made a decision on what to do with the circumflex lesion.   For questions or updates, please contact [facility] Please consult [email or web address] for contact info under Cardiology/STEMI.      Signed, Bryan Lemma, MD  09/15/2017, 8:59 AM

## 2017-09-15 NOTE — Care Management Note (Signed)
Case Management Note  Patient Details  Name: Ashley HainesLeticia Moran MRN: [id number] Date of Birth: 1950/11/28  Subjective/Objective:  From home, s/p stent intervention ,will be on brilinta, NCM gave patient the 30 day coupon card and the 5.00 coupon card.                  Action/Plan: DC home when ready.   Expected Discharge Date:                  Expected Discharge Plan:  Home/Self Care  In-House Referral:     Discharge planning Services  CM Consult, Medication Assistance  Post Acute Care Choice:    Choice offered to:     DME Arranged:    DME Agency:     HH Arranged:    HH Agency:     Status of Service:  Completed, signed off  If discussed at [employer] Length of Stay Meetings, dates discussed:    Additional Comments:  Leone Havenaylor, Velera Lansdale Clinton, RN 09/15/2017, 10:30 AM

## 2017-09-15 NOTE — Discharge Instructions (Signed)

## 2017-09-15 NOTE — Progress Notes (Signed)
Site area1: right groin  Site Prior to Removal:  Level 0  Pressure Applied For 15 MINUTES    Minutes Beginning at 2338  Manual:   Yes.    Patient Status During Pull:  AA0 X4   Post Pull Groin Site:  Level 0  Post Pull Instructions Given:  Yes.    Post Pull Pulses Present:  Yes.    Dressing Applied:  Yes.    Comments:   Pt.totolerated procedure well

## 2017-09-15 NOTE — Progress Notes (Signed)
CARDIAC REHAB PHASE I   PRE:  Rate/Rhythm: 82 SR  BP:  Sitting: 193/43      170/60 manual   MD aware  SaO2: 98 RA  MODE:  Ambulation: 750 ft   POST:  Rate/Rhythm: 66 SR  BP:  Sitting: 210/58      195/60   RN aware    SaO2: 97 RA    Pt ambulated 72250ft in hallway with standby assist. No c/o CP or SOB, but complaining about nauseated feeling from GI cocktail. Educated pt and daughter on importance of Brilinta, ASA, statin and NTG. Encouraged pt to find alternative pain medicine for her arthritis as her current has ASA in it. Pt highly encouraged to quit smoking, tip sheet and fake cigarette given. Pt and daughter given heart healthy diet. Pt states her current diet consists of fried and processed foods. Pt unsure that she will be willing to comply. Pt given restrictions and exercise guidelines and encourage to take it easy until she follows up with cardiologist next week. Will hold on refferal to CRP II as pt is for possible staged PCI next week.  [id number] Ashley Boweneresa  [name], RN BSN 09/15/2017 10:29 AM

## 2017-09-15 NOTE — Telephone Encounter (Signed)
LM for daughter that lisinopril was refilled but voltaren should come from PCP

## 2017-09-15 NOTE — Progress Notes (Signed)
[name], [name] C, RN        # 6.  S/W ERICA @ [facility] # [phone]    BRILINTA 90 MG BID  COVER- YES  CO-PAY- $ 50.00  TIER- 1 DRUG  PRIOR APPROVAL- NO  NO DEDUCTIBLE   PREFERRED PHARMACY - YES [facility]

## 2017-09-18 NOTE — Discharge Summary (Signed)
[facility]  Patient name: Ashley HainesLeticia Salsgiver Medical record number: [id number] Date of birth: [birth year] Age: 67 y.o. Gender: female Date of Admission: 09/12/2017  Date of Discharge: 6/20 Admitting Physician: Nestor RampSara L Neal, MD  Primary Care Provider: Kaleen MaskElkins, Wilson Oliver, MD Consultants: cardiology  Indication for Hospitalization: chest pain, shortness of breath  Discharge Diagnoses/Problem List:  Coronary artery disease Headache with reported trauma Hypertension Tobacco abuse Pulmonary nodule  Disposition: home  Discharge Condition: stable  Discharge Exam: General:Lying in bed comfortably, in no acute distress, resting comfortably on room air Cardiovascular:RRR, no m/r/g, no chest pain Respiratory:Lungs clear to ausculation bilaterally Gastrointestinal:Soft, NT, ND, BS + WUJ:[id number]:Moving all limbs equally, no lower extremity edema Derm:Warm and dry Neuro:Alert and awake, oriented, grossly normal Psych:Appropriate affect  Brief Hospital Course:  67 year old who presented on 6/17 with chest pain. Patient required a nitro gtt to relieve her pain. Troponins were w/n/l at 0.01 and ekg showed a new partial RBBB. Patient underwent nuclear stress test on 6/17 which did show areas of inferior wall ischemia. Due to the inferior wall ischemia patient was scheduled for LHC with stent placement on 6/18. Patient had stent placed in RCA. Patient was started on brillinta and aspirin after this placement. Patient was started on coreg, lisinopril, atorvastatin. Chest pain resolved, and nitro gtt was stopped.  Patient discharged with very close follow up for 6/25. Will likely need a staged atherectomy and cardiology is to make that decision at that appointment. Tentatively scheduled for 6/27 for atherectomy. Cardiac rehab set up prior to dc. Nicoderm patch given to help with smoking cessation.  Issues for Follow Up:  1. Follow up with cardiology  regarding staged atherectomy vs medical management   Significant Procedures: Left heart cath, stent placement  Significant Labs and Imaging:  Recent Labs  Lab 09/12/17 1454  09/12/17 1529 09/13/17 1519 09/15/17 0223  WBC 11.5*  --   --  10.2 9.1  HGB 13.8   < > 16.0* 12.0 11.6*  HCT 44.6   < > 47.0* 39.9 39.1  PLT 339  --   --  284 296   < > = values in this interval not displayed.   Recent Labs  Lab 09/12/17 1454 09/12/17 1503 09/12/17 1529 09/13/17 0826 09/13/17 1519 09/15/17 0223  NA 135 132*  132* 137  --  138 142  K 4.0 8.0*  8.0* 4.7  --  3.8 3.6  CL 102 106  106 104  --  105 111  CO2 22  --   --   --  25 24  GLUCOSE 157* 156*  156* 160*  --  212* 195*  BUN 20 33*  33* 29*  --  18 18  CREATININE 0.87 0.80  0.80 0.80  --  0.96 0.93  CALCIUM 10.3  --   --   --  9.0 8.5*  ALKPHOS  --   --   --  62  --   --   AST  --   --   --  18  --   --   ALT  --   --   --  16  --   --   ALBUMIN  --   --   --  3.2*  --   --     Results/Tests Pending at Time of Discharge:  Discharge Medications:  Allergies as of 09/15/2017   No Known Allergies     Medication List    STOP  taking these medications   BAYER BACK & BODY 500-32.5 MG Tabs Generic drug:  Aspirin-Caffeine   HAIR SKIN AND NAILS FORMULA Tabs     TAKE these medications   aspirin 81 MG chewable tablet Chew 1 tablet (81 mg total) by mouth daily.   atorvastatin 40 MG tablet Commonly known as:  LIPITOR Take 1 tablet (40 mg total) by mouth daily at 6 PM.   diclofenac 75 MG EC tablet Commonly known as:  VOLTAREN Take 1 tablet (75 mg total) by mouth 2 (two) times daily.   metoprolol tartrate 25 MG tablet Commonly known as:  LOPRESSOR Take 1 tablet (25 mg total) by mouth 2 (two) times daily.   nicotine 14 mg/24hr patch Commonly known as:  NICODERM CQ - dosed in mg/24 hours Place 1 patch (14 mg total) onto the skin daily.   nitroGLYCERIN 0.4 MG SL tablet Commonly known as:  NITROSTAT Place 1 tablet  (0.4 mg total) under the tongue every 5 (five) minutes as needed for chest pain.   ticagrelor 90 MG Tabs tablet Commonly known as:  BRILINTA Take 1 tablet (90 mg total) by mouth 2 (two) times daily.   traMADol 50 MG tablet Commonly known as:  ULTRAM Take 1 tablet (50 mg total) by mouth every 6 (six) hours as needed for pain.       Discharge Instructions: Please refer to Patient Instructions section of EMR for full details.  Patient was counseled important signs and symptoms that should prompt return to medical care, changes in medications, dietary instructions, activity restrictions, and follow up appointments.   Follow-Up Appointments:  Follow up with cardiology 6/25   Myrene Buddy, MD 09/18/2017, 7:23 PM PGY-1, [facility]

## 2017-09-20 ENCOUNTER — Encounter: Payer: Self-pay | Admitting: Cardiology

## 2017-09-20 ENCOUNTER — Ambulatory Visit (INDEPENDENT_AMBULATORY_CARE_PROVIDER_SITE_OTHER): Payer: 59 | Admitting: Cardiology

## 2017-09-20 VITALS — BP 190/74 | HR 56 | Ht 60.0 in | Wt 126.4 lb

## 2017-09-20 DIAGNOSIS — M7989 Other specified soft tissue disorders: Secondary | ICD-10-CM | POA: Diagnosis not present

## 2017-09-20 DIAGNOSIS — I25110 Atherosclerotic heart disease of native coronary artery with unstable angina pectoris: Secondary | ICD-10-CM

## 2017-09-20 DIAGNOSIS — E7849 Other hyperlipidemia: Secondary | ICD-10-CM | POA: Diagnosis not present

## 2017-09-20 DIAGNOSIS — I10 Essential (primary) hypertension: Secondary | ICD-10-CM

## 2017-09-20 DIAGNOSIS — F17200 Nicotine dependence, unspecified, uncomplicated: Secondary | ICD-10-CM

## 2017-09-20 DIAGNOSIS — I2510 Atherosclerotic heart disease of native coronary artery without angina pectoris: Secondary | ICD-10-CM

## 2017-09-20 LAB — D-DIMER, QUANTITATIVE (NOT AT ARMC): D-DIMER: 1.6 mg{FEU}/L — AB (ref 0.00–0.49)

## 2017-09-20 MED ORDER — LISINOPRIL 20 MG PO TABS: 20.0000 mg | ORAL_TABLET | Freq: Every day | ORAL | 3 refills | Status: DC

## 2017-09-20 MED ORDER — ATORVASTATIN CALCIUM 40 MG PO TABS: 40.0000 mg | ORAL_TABLET | Freq: Every day | ORAL | 0 refills | Status: DC

## 2017-09-20 MED ORDER — CARVEDILOL 6.25 MG PO TABS: 6.2500 mg | ORAL_TABLET | Freq: Two times a day (BID) | ORAL | 3 refills | Status: DC

## 2017-09-20 NOTE — Patient Instructions (Signed)
MEDICATION CHANGES  STOP METOPROLOL   START CARVEDILOL 6.25 MG ONE TABLET TWICE A DAY  INCREASE LISINOPRIL 20 MG  ONE TABLET ONCE A DAY.    LABS - TODAY D-DIMER   LABS - 3 MONTHS - DO NOT EAT OR DRINK THE MORNING OF THE TEST. (WILL MAIL YOU LAB SLIP) LIPID CMP    MAY RETURN TO WORK ON July 1,2019 - WITH NO RESTRICTIONS.    Your physician recommends that you schedule a follow-up appointment in 3 MONTHS WITH DR HARDING.

## 2017-09-20 NOTE — Assessment & Plan Note (Signed)
Thankfully, she seems to be doing very well with no recurrent anginal symptoms after PCI to the RCA which is clearly the culprit lesion.  She does have existing severe circumflex which is heavily calcified atherectomy.  The purpose of this visit was to determine whether not we need to proceed with that as scheduled tomorrow versus continue medical management.  In the absence of active anginal symptoms, best course of action is probably proceed with medical management as opposed to going forward somewhat high risk procedure atherectomy and likely need for pacemaker.  Plan: continue medical therapy & monitor for symptoms of angina  Continue aspirin plus Brilinta for minimum 1 year, would then probably reduce dose of Brilinta to 60 and continue ongoing.  We will convert from metoprolol to carvedilol for blood pressure control   Continue ACE inhibitor, But increase to 20 mg lisinopril  Continue atorvastatin   continue smoking cessation  PRN NTG  OK to return to work July 1.

## 2017-09-20 NOTE — Assessment & Plan Note (Signed)
Very high blood pressure today, I am not sure if she is actually been taking any new medications --> she certainly has not taken her lisinopril today.  I do not see that she has her metoprolol with her therefore I do not think she is taking that either.    Plan: Increase lisinopril to 20 mg daily, convert from metoprolol to carvedilol 6.25 twice daily   follow home blood pressures, anticipating to add either HCTZ or more likely calcium channel blocker.

## 2017-09-20 NOTE — Assessment & Plan Note (Signed)
Notable coronary calcification noted on CT scan confirmed by cardiac catheterization.  There is significant calcification in the circumflex and LAD --> if we did PCI on the circumflex and will require atherectomy.

## 2017-09-20 NOTE — Progress Notes (Signed)
PCP: Kaleen Mask, MD  Clinic Note: Chief Complaint  Patient presents with  . Hospitalization Follow-up    No complaints  . Coronary Artery Disease    RCA PCI with potential pending PCI of circumflex    HPI: Ashley Moran is a 67 y.o. female with a PMH below who presents today for post hospital follow-up for unstable angina status post PCI to RCA with existing circumflex disease.[name]  Ashley Moran was last seen during her admission from  June 17-20, 2019.  Recent Hospitalizations: Unstable angina   June 17-20, 2019  Studies Personally Reviewed - (if available, images/films reviewed: From Epic Chart or Care Everywhere)  2D Echo September 13, 2017: Mild LVH.  EF 60 to 65%.  No RWMA.  GR 1 DD.  Myoview 09/13/2017: EF 73%.  Inferior-inferolateral perfusion defect consistent with ischemia.  Cardiac Cath-PCI September 14, 2017: mRCA 90% & pRCA 50% -> DES PCI.  p-mCx ~80%, m&dCx 70-75% (on review ~60-65% with focal ~70% eccentric lesion - calcified), pLAD ~50%  PCI: p-mRCA (covering both lesions): Resolute Onyx DES 2.5 mm x 26 mm (~2.6 mm)  Interval History: Ashley Moran returns today with her daughter indicating that she actually seems to be done quite well with no recurrent anginal symptoms following discharge.  She has been off about restore walking well without any significant symptoms nothing like she had her presenting symptoms.  No resting or exertional chest tightness or pressure.  No exertional dyspnea  She only notes that she has been somewhat irritable and anxious because she is been trying to quit smoking.  Has not had a cigarette since she went to the hospital.  She has been agitated as a result.  Note is that she has been having a hard time getting her medications filled.  She has not missed any Brilinta but is not sure if she has her beta-blocker and statin dose correct.  She denies any headache or blurred vision, dizziness or wooziness.  She has not had any heart failure.  No  rapid palpitations.  No symptoms of TIA symptoms fugax.  No melena, hematochezia or hematuria or epistaxis. 2 days ago she hard of brief episodes of significant swelling of the right leg going down that was really noticeable on driving home it seemed to resolve spontaneously and is no longer present.  Other than having some mild bruising at the site of heart catheterization access site, no major issues.  It is important to note that there were some difficulty obtaining hemostasis when her sheath was removed.  No claudication.  ROS: A comprehensive was performed. Review of Systems  Constitutional: Negative for malaise/fatigue.  HENT: Negative for congestion.   Eyes: Negative for blurred vision.  Respiratory: Positive for cough (Morning,) and wheezing. Negative for sputum production and shortness of breath.   Cardiovascular: Positive for leg swelling (Per HPI).  Gastrointestinal: Positive for constipation (Chronic constipation). Negative for blood in stool, melena and nausea.  Genitourinary: Negative for dysuria.  Musculoskeletal: Negative for falls, joint pain and myalgias.  Neurological: Negative for dizziness, weakness and headaches.  Psychiatric/Behavioral: Negative for memory loss. The patient is nervous/anxious (Seems to be somewhat hyper vigilant, irritable since starting smoking cessation.). The patient does not have insomnia.   All other systems reviewed and are negative.   I have reviewed and (if needed) personally updated the patient's problem list, medications, allergies, past medical and surgical history, social and family history.   Past Medical History:  Diagnosis Date  . Coronary artery disease  involving native coronary artery of native heart with angina pectoris (HCC) 08/2017   Unstable Angina - heavy coronary Ca on CT, Inferior perfusion defect on Myoview -> Cath 90% mRCA (DES PCI), p-mCx 60-75% heavily calcified (Med Rx), mLAD 50%.   . Essential hypertension   . Heavy  cigarette smoker (20-39 per day)    Trying to quit as of 08/2017  . Hyperlipidemia with target LDL less than 70     Past Surgical History:  Procedure Laterality Date  . CORONARY STENT INTERVENTION N/A 09/14/2017   Procedure: CORONARY STENT INTERVENTION;  Surgeon: Lennette BihariKelly, Thomas A, MD;  [facility];  Service: Cardiovascular:: p-mRCA (covering both lesions): Resolute Onyx DES 2.5 mm x 26 mm (~2.6 mm)  . LEFT HEART CATH AND CORONARY ANGIOGRAPHY N/A 09/14/2017   Procedure: LEFT HEART CATH AND CORONARY ANGIOGRAPHY;  Surgeon: Lennette BihariKelly, Thomas A, MD;  Location: [facility] INVASIVE CV LAB;  Service: Cardiovascular: mRCA 90% & pRCA 50% -> DES PCI.  p-mCx ~80%, m&dCx 70-75% (on review ~60-65% with focal ~70% eccentric lesion - calcified), pLAD ~50%    Current Meds  Medication Sig  . aspirin 81 MG chewable tablet Chew 1 tablet (81 mg total) by mouth daily.  [name]. atorvastatin (LIPITOR) 40 MG tablet Take 1 tablet (40 mg total) by mouth daily at 6 PM.  . diclofenac (VOLTAREN) 75 MG EC tablet Take 1 tablet (75 mg total) by mouth 2 (two) times daily.  . nitroGLYCERIN (NITROSTAT) 0.4 MG SL tablet Place 1 tablet (0.4 mg total) under the tongue every 5 (five) minutes as needed for chest pain.  . ticagrelor (BRILINTA) 90 MG TABS tablet Take 1 tablet (90 mg total) by mouth 2 (two) times daily.  . traMADol (ULTRAM) 50 MG tablet Take 1 tablet (50 mg total) by mouth every 6 (six) hours as needed for pain.  . [DISCONTINUED] atorvastatin (LIPITOR) 40 MG tablet Take 1 tablet (40 mg total) by mouth daily at 6 PM.  . [DISCONTINUED] lisinopril (PRINIVIL,ZESTRIL) 10 MG tablet Take 1 tablet (10 mg total) by mouth every evening.  . [DISCONTINUED] metoprolol tartrate (LOPRESSOR) 25 MG tablet Take 1 tablet (25 mg total) by mouth 2 (two) times daily.    No Known Allergies  Social History   Tobacco Use  . Smoking status: Former Smoker    Packs/day: 0.50    Last attempt to quit: 09/12/2017    Years since quitting: 0.0  .  Smokeless tobacco: Never Used  Substance Use Topics  . Alcohol use: No  . Drug use: No   Social History   Social History Narrative  . Not on file    family history includes Other in her mother.  Wt Readings from Last 3 Encounters:  09/20/17 126 lb 6.4 oz (57.3 kg)  09/15/17 125 lb 10.6 oz (57 kg)    PHYSICAL EXAM BP (!) 190/74   Pulse (!) 56   Ht 5' (1.524 m)   Wt 126 lb 6.4 oz (57.3 kg)   BMI 24.69 kg/m  Physical Exam  Constitutional: She is oriented to person, place, and time. She appears well-developed and well-nourished. No distress.  Healthy-appearing.  Well-groomed  HENT:  Head: Normocephalic and atraumatic.  Poor dentition, no missing teeth, significant gum disease.  Neck: Normal range of motion. Neck supple. No hepatojugular reflux and no JVD present. Carotid bruit is not present. No tracheal deviation present. No thyromegaly present.  Cardiovascular: Regular rhythm, S1 normal, intact distal pulses and normal pulses.  No extrasystoles are  present. Bradycardia present. PMI is not displaced. Exam reveals no gallop and no friction rub.  No murmur heard. Split S2 from RBBB  Pulmonary/Chest: Effort normal and breath sounds normal. No respiratory distress. She has no wheezes. She has no rales.  Abdominal: Soft. Bowel sounds are normal. She exhibits no distension. There is no tenderness. There is no rebound.  No HSM  Musculoskeletal: Normal range of motion. She exhibits no edema.  Neurological: She is alert and oriented to person, place, and time.  Skin: Skin is warm and dry.  Psychiatric: She has a normal mood and affect. Her behavior is normal. Judgment and thought content normal.  Vitals reviewed.    Adult ECG Report  Rate: 56 ;  Rhythm: sinus bradycardia and RBBB otherwise normal axis and durations.;   Narrative Interpretation:  Stable EKG   Other studies Reviewed: Additional studies/ records that were reviewed today include:  Recent Labs:    Lab Results    Component Value Date   CHOL 163 09/13/2017   HDL 36 (L) 09/13/2017   LDLCALC 103 (H) 09/13/2017   TRIG 118 09/13/2017   CHOLHDL 4.5 09/13/2017   Lab Results  Component Value Date   CREATININE 0.93 09/15/2017   BUN 18 09/15/2017   NA 142 09/15/2017   K 3.6 09/15/2017   CL 111 09/15/2017   CO2 24 09/15/2017    ASSESSMENT / PLAN: Problem List Items Addressed This Visit    Smoker (Chronic)    She is now about a week out from her last cigarette.  Trying to do this "cold [place] "but is quite irritable" =--> I recommended that she tried to handle this without medications but may warrant use of Wellbutrin or potentially even consider Chantix. She may also try patches.  Smoking cessation instruction/counseling given:  commended patient for quitting and reviewed strategies for preventing relapses      Right leg swelling    Worried with unilateral leg swelling especially in the right leg where they held pressure for a long time that she may have developed a short-term DVT.  Although swelling seems to have gone and she has not had any symptoms of PE.  No evidence of swelling now.  Plan: Check d-dimer to exclude clot.      Relevant Orders   D-dimer, quantitative (not at [facility])   Hyperlipidemia due to dietary fat intake (Chronic)    Had previously been on low-dose statin, not taking them regularly.  Now on 40 mg atorvastatin -- > check labs in 3 months.      Relevant Medications   carvedilol (COREG) 6.25 MG tablet   lisinopril (PRINIVIL,ZESTRIL) 20 MG tablet   atorvastatin (LIPITOR) 40 MG tablet   Other Relevant Orders   Lipid panel   Comprehensive metabolic panel   Essential hypertension (Chronic)    Very high blood pressure today, I am not sure if she is actually been taking any new medications --> she certainly has not taken her lisinopril today.  I do not see that she has her metoprolol with her therefore I do not think she is taking that either.    Plan: Increase lisinopril  to 20 mg daily, convert from metoprolol to carvedilol 6.25 twice daily   follow home blood pressures, anticipating to add either HCTZ or more likely calcium channel blocker.      Relevant Medications   carvedilol (COREG) 6.25 MG tablet   lisinopril (PRINIVIL,ZESTRIL) 20 MG tablet   atorvastatin (LIPITOR) 40 MG tablet   Coronary  artery disease involving native coronary artery of native heart with unstable angina pectoris (HCC) - Primary (Chronic)    Thankfully, she seems to be doing very well with no recurrent anginal symptoms after PCI to the RCA which is clearly the culprit lesion.  She does have existing severe circumflex which is heavily calcified atherectomy.  The purpose of this visit was to determine whether not we need to proceed with that as scheduled tomorrow versus continue medical management.  In the absence of active anginal symptoms, best course of action is probably proceed with medical management as opposed to going forward somewhat high risk procedure atherectomy and likely need for pacemaker.  Plan: continue medical therapy & monitor for symptoms of angina  Continue aspirin plus Brilinta for minimum 1 year, would then probably reduce dose of Brilinta to 60 and continue ongoing.  We will convert from metoprolol to carvedilol for blood pressure control   Continue ACE inhibitor, But increase to 20 mg lisinopril  Continue atorvastatin   continue smoking cessation  PRN NTG  OK to return to work July 1.      Relevant Medications   carvedilol (COREG) 6.25 MG tablet   lisinopril (PRINIVIL,ZESTRIL) 20 MG tablet   atorvastatin (LIPITOR) 40 MG tablet   Other Relevant Orders   EKG 12-Lead   Lipid panel   Comprehensive metabolic panel   Coronary artery calcification seen on CAT scan (Chronic)    Notable coronary calcification noted on CT scan confirmed by cardiac catheterization.  There is significant calcification in the circumflex and LAD --> if we did PCI on the  circumflex and will require atherectomy.      Relevant Medications   carvedilol (COREG) 6.25 MG tablet   lisinopril (PRINIVIL,ZESTRIL) 20 MG tablet   atorvastatin (LIPITOR) 40 MG tablet     I spent a total of 30 minutes with the patient and chart review. >  50% of the time was spent in direct patient consultation.   Current medicines are reviewed at length with the patient today.  (+/- concerns) - not taken any AM meds, not sure if she has Metoprolol  The following changes have been made:  see below.  Patient Instructions  MEDICATION CHANGES  STOP METOPROLOL   START CARVEDILOL 6.25 MG ONE TABLET TWICE A DAY  INCREASE LISINOPRIL 20 MG  ONE TABLET ONCE A DAY.    LABS - TODAY D-DIMER   LABS - 3 MONTHS - DO NOT EAT OR DRINK THE MORNING OF THE TEST. (WILL MAIL YOU LAB SLIP) LIPID CMP    MAY RETURN TO WORK ON July 1,2019 - WITH NO RESTRICTIONS.    Your physician recommends that you schedule a follow-up appointment in 3 MONTHS WITH DR HARDING.     Studies Ordered:   Orders Placed This Encounter  Procedures  . D-dimer, quantitative (not at [facility])  . Lipid panel  . Comprehensive metabolic panel  . EKG 12-Lead      Bryan Lemma, M.D., M.S. Interventional Cardiologist   Pager # [phone] Phone # [phone] [address] [place], [place] [address]   Thank you for choosing [facility] at [facility]!!

## 2017-09-20 NOTE — Assessment & Plan Note (Signed)
Had previously been on low-dose statin, not taking them regularly.  Now on 40 mg atorvastatin -- > check labs in 3 months.

## 2017-09-20 NOTE — Assessment & Plan Note (Signed)
She is now about a week out from her last cigarette.  Trying to do this "cold [place] "but is quite irritable" =--> I recommended that she tried to handle this without medications but may warrant use of Wellbutrin or potentially even consider Chantix. She may also try patches.  Smoking cessation instruction/counseling given:  commended patient for quitting and reviewed strategies for preventing relapses

## 2017-09-20 NOTE — Assessment & Plan Note (Signed)
Worried with unilateral leg swelling especially in the right leg where they held pressure for a long time that she may have developed a short-term DVT.  Although swelling seems to have gone and she has not had any symptoms of PE.  No evidence of swelling now.  Plan: Check d-dimer to exclude clot.

## 2017-09-21 ENCOUNTER — Telehealth: Payer: Self-pay | Admitting: *Deleted

## 2017-09-21 DIAGNOSIS — R7989 Other specified abnormal findings of blood chemistry: Secondary | ICD-10-CM

## 2017-09-21 DIAGNOSIS — M7989 Other specified soft tissue disorders: Secondary | ICD-10-CM

## 2017-09-21 NOTE — Telephone Encounter (Signed)
LEFT ANOTHER MESSAGE TO CALL OFFICE

## 2017-09-21 NOTE — Telephone Encounter (Signed)
LEFT MESSAGE TO CALL FIRST THING IN THE MORNING AFTER 8 AM.

## 2017-09-21 NOTE — Telephone Encounter (Signed)
-----   Message from Marykay Lexavid W Harding, MD sent at 09/20/2017 11:30 PM EDT ----- D dimer is a bit elevated - with her transient RLE swelling, I think it would be wise to have her come in for RLE Venous Duplex to exclude femoral vein DVT (after prolonged manual pressure hold for femoral arterial sheath post-CAth-PCI.  Bryan Lemmaavid Harding, MD

## 2017-09-21 NOTE — Telephone Encounter (Signed)
LEFT MESSAGE TO CALL BACK URGENTLY- NEED TO SCHEDULE RLE VENOUS DUPLEX - R/O DVT. PATIENT D-DIMER  ABNORMAL. ( THERE IS A OPENING THIS AFTERNOON)

## 2017-09-22 ENCOUNTER — Ambulatory Visit (HOSPITAL_COMMUNITY): Admission: RE | Admit: 2017-09-22 | Payer: 59 | Source: Ambulatory Visit | Admitting: Cardiology

## 2017-09-22 SURGERY — CORONARY ATHERECTOMY
Anesthesia: LOCAL

## 2017-09-22 NOTE — Telephone Encounter (Signed)
Spoke to patient. Patient states she did not answer her phone or listen to message yesterday. . RN informed patient of results from d-dimer. Patient aware would like for her to come to office for venous doppler -   Patient states she is unable to come today.biut can come tomorrow. Appointment schedule for 11 am  Friday, be at office by 10:45 am . Patient verbalized understanding

## 2017-09-23 ENCOUNTER — Ambulatory Visit (HOSPITAL_COMMUNITY)
Admission: RE | Admit: 2017-09-23 | Discharge: 2017-09-23 | Disposition: A | Discharge status: Home / Self Care | Payer: 59 | Source: Ambulatory Visit | Attending: Cardiovascular Disease | Admitting: Cardiovascular Disease

## 2017-09-23 DIAGNOSIS — R7989 Other specified abnormal findings of blood chemistry: Secondary | ICD-10-CM | POA: Diagnosis not present

## 2017-09-23 DIAGNOSIS — M7989 Other specified soft tissue disorders: Secondary | ICD-10-CM | POA: Diagnosis not present

## 2017-09-27 ENCOUNTER — Telehealth: Payer: Self-pay | Admitting: *Deleted

## 2017-09-27 NOTE — Telephone Encounter (Signed)
Left message to call back  

## 2017-09-27 NOTE — Telephone Encounter (Signed)
-----   Message from Marykay Lexavid W Harding, MD sent at 09/26/2017  1:35 AM EDT ----- LE Venous doppler - negative for DVT

## 2017-10-04 NOTE — Telephone Encounter (Signed)
LEFT  RESULT MESSAGE ON VOICEMAIL -- TEST WAS NEGATIVE. ANY QUESTION MAY CALL BACK

## 2017-10-07 ENCOUNTER — Other Ambulatory Visit: Payer: Self-pay | Admitting: Family Medicine

## 2017-10-08 ENCOUNTER — Other Ambulatory Visit: Payer: Self-pay | Admitting: Cardiology

## 2017-10-11 ENCOUNTER — Other Ambulatory Visit: Payer: Self-pay | Admitting: Family Medicine

## 2017-10-13 ENCOUNTER — Other Ambulatory Visit: Payer: Self-pay | Admitting: Family Medicine

## 2017-11-05 ENCOUNTER — Other Ambulatory Visit: Payer: Self-pay | Admitting: Cardiology

## 2017-11-10 ENCOUNTER — Other Ambulatory Visit: Payer: Self-pay | Admitting: Cardiology

## 2017-11-14 ENCOUNTER — Telehealth: Payer: Self-pay | Admitting: *Deleted

## 2017-11-14 DIAGNOSIS — E7849 Other hyperlipidemia: Secondary | ICD-10-CM

## 2017-11-14 DIAGNOSIS — I25110 Atherosclerotic heart disease of native coronary artery with unstable angina pectoris: Secondary | ICD-10-CM

## 2017-11-14 NOTE — Telephone Encounter (Signed)
-----   Message from Tobin ChadSharon Martin V, RN sent at 09/20/2017 11:36 AM EDT -----  WILL MAIL LAB SLIP AND LETTER -LIPID,CMP @8 /26/19   DUE 12/22/17

## 2017-11-14 NOTE — Telephone Encounter (Signed)
Mailed letter and labslip due 12/22/17

## 2017-11-15 ENCOUNTER — Other Ambulatory Visit: Payer: Self-pay | Admitting: Cardiology

## 2017-11-15 MED ORDER — ATORVASTATIN CALCIUM 40 MG PO TABS: 40.0000 mg | ORAL_TABLET | Freq: Every day | ORAL | 3 refills | Status: DC

## 2017-11-15 NOTE — Addendum Note (Signed)
Addended by: Armen PickupWYSOR, Kushal Saunders T on: 11/15/2017 11:52 AM   Modules accepted: Orders

## 2017-12-13 LAB — COMPREHENSIVE METABOLIC PANEL
A/G RATIO: 1.4 (ref 1.2–2.2)
ALT: 13 IU/L (ref 0–32)
AST: 15 IU/L (ref 0–40)
Albumin: 4.3 g/dL (ref 3.6–4.8)
Alkaline Phosphatase: 87 IU/L (ref 39–117)
BILIRUBIN TOTAL: 0.4 mg/dL (ref 0.0–1.2)
BUN/Creatinine Ratio: 19 (ref 12–28)
BUN: 19 mg/dL (ref 8–27)
CALCIUM: 10.2 mg/dL (ref 8.7–10.3)
CHLORIDE: 101 mmol/L (ref 96–106)
CO2: 22 mmol/L (ref 20–29)
Creatinine, Ser: 1 mg/dL (ref 0.57–1.00)
GFR calc Af Amer: 68 mL/min/{1.73_m2} (ref >59)
GFR, EST NON AFRICAN AMERICAN: 59 mL/min/{1.73_m2} — AB (ref >59)
GLOBULIN, TOTAL: 3.1 g/dL (ref 1.5–4.5)
Glucose: 153 mg/dL — ABNORMAL HIGH (ref 65–99)
POTASSIUM: 5.1 mmol/L (ref 3.5–5.2)
SODIUM: 139 mmol/L (ref 134–144)
Total Protein: 7.4 g/dL (ref 6.0–8.5)

## 2017-12-13 LAB — LIPID PANEL
CHOL/HDL RATIO: 4.7 ratio — AB (ref 0.0–4.4)
CHOLESTEROL TOTAL: 146 mg/dL (ref 100–199)
HDL: 31 mg/dL — AB (ref >39)
LDL CALC: 75 mg/dL (ref 0–99)
TRIGLYCERIDES: 199 mg/dL — AB (ref 0–149)
VLDL Cholesterol Cal: 40 mg/dL (ref 5–40)

## 2017-12-16 ENCOUNTER — Other Ambulatory Visit: Payer: Self-pay

## 2017-12-20 ENCOUNTER — Telehealth: Payer: Self-pay | Admitting: *Deleted

## 2017-12-20 NOTE — Telephone Encounter (Signed)
-----   Message from Marykay Lexavid W Harding, MD sent at 12/18/2017  8:40 PM EDT ----- Notable improvement in overall cholesterol level.  LDL is down to 75 from 103.  Triglyceride levels are little bit up and that usually has to do with increased carbohydrate intake (simple sugars, starches etc.)  For now would recommend continue on current dose of statin and monitoring dietary intake. Chemistry panel continues to show elevated blood sugar levels that goes along with the elevated triglycerides.  Normal kidney function and normal electrolytes.  Bryan Lemmaavid Harding, MD

## 2017-12-20 NOTE — Telephone Encounter (Signed)
LEFT MESSAGE TO CALL BACK F/U APPT 12/26/17

## 2017-12-26 ENCOUNTER — Encounter: Payer: Self-pay | Admitting: Cardiology

## 2017-12-26 ENCOUNTER — Ambulatory Visit (INDEPENDENT_AMBULATORY_CARE_PROVIDER_SITE_OTHER): Payer: 59 | Admitting: Cardiology

## 2017-12-26 VITALS — BP 162/73 | HR 74 | Ht 59.0 in | Wt 134.8 lb

## 2017-12-26 DIAGNOSIS — I25119 Atherosclerotic heart disease of native coronary artery with unspecified angina pectoris: Secondary | ICD-10-CM

## 2017-12-26 DIAGNOSIS — Z955 Presence of coronary angioplasty implant and graft: Secondary | ICD-10-CM | POA: Diagnosis not present

## 2017-12-26 DIAGNOSIS — E7849 Other hyperlipidemia: Secondary | ICD-10-CM | POA: Diagnosis not present

## 2017-12-26 DIAGNOSIS — Z87891 Personal history of nicotine dependence: Secondary | ICD-10-CM

## 2017-12-26 DIAGNOSIS — I10 Essential (primary) hypertension: Secondary | ICD-10-CM | POA: Diagnosis not present

## 2017-12-26 MED ORDER — CLOPIDOGREL BISULFATE 75 MG PO TABS: 75.0000 mg | ORAL_TABLET | Freq: Every day | ORAL | 3 refills | Status: DC

## 2017-12-26 MED ORDER — CARVEDILOL 25 MG PO TABS: 25.0000 mg | ORAL_TABLET | Freq: Two times a day (BID) | ORAL | 3 refills | Status: DC
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
## 2017-12-26 NOTE — Patient Instructions (Signed)
MEDICATION  INSTRUCTIONS   START CLOPIDOGREL 75 MG  DAILY ( THE FIRST DAY TAKE 4 TABLET , )THEN TAKE ONE TABLET DAILY   STOP CARVEDILOL 6.25 MG   INCREASE  CARVEDILOL TO 25 MG  ( 1 TABLET ) TWICE A DAY       Your physician wants you to follow-up in 6 MONTHS WITH DR HARDING. You will receive a reminder letter in the mail two months in advance. If you don't receive a letter, please call our office to schedule the follow-up appointment.    If you need a refill on your cardiac medications before your next appointment, please call your pharmacy.

## 2017-12-27 ENCOUNTER — Encounter: Payer: Self-pay | Admitting: Cardiology

## 2017-12-27 NOTE — Assessment & Plan Note (Signed)
Doing well with smoking cessation.  She and her daughter are very excited about this.  She has lost the taste now, and is having less cravings.  Less irritable.  I congratulated her on her successful cessation.  I encouraged her to continue with her efforts.

## 2017-12-27 NOTE — Assessment & Plan Note (Signed)
Thankfully, with her not being on Brilinta, she has not had any issues with stent thrombosis. For financial reasons, we will switch her to Plavix 75 mg daily.  She will take 4 tabs tonight and then 75 mg daily afterwards. She is on ACE inhibitor and beta-blocker as well as aspirin. She is also on moderate dose atorvastatin.

## 2017-12-27 NOTE — Assessment & Plan Note (Signed)
Still high blood pressure today. Plan: Continue ACE inhibitor at current dose, increase carvedilol (plan will be to stepwise go to 25 mg twice daily)

## 2017-12-27 NOTE — Assessment & Plan Note (Signed)
Recently check labs showed LDL down to 75 from 113 at the time of her PCI.  At this point I think we can monitor and recheck at the time of her six-month follow-up visit.  If not at goal at that time, may need to consider more aggressive treatment with potentially adding Zetia.

## 2017-12-27 NOTE — Assessment & Plan Note (Addendum)
No further anginal symptoms after PCI to the RCA which seem to be the culprit lesion.  Plan for now will be to continue treating the circumflex lesion medically unless Ashley Moran has more symptoms. We discussed what PCI of the circumflex would entail including temporary pacemaker and atherectomy.  Plan: Converting from Brilinta to Plavix 75 mg daily along with aspirin.  On beta-blocker and ACE inhibitor.  Increasing dose of beta-blocker to 25 mg twice daily  Continue statin  DAPT for at least the first 6 months post PCI and then okay to stop aspirin continuing Plavix for likely 2 years post PCI --> at this point, would consider low-dose Xarelto plus aspirin

## 2018-01-12 NOTE — Telephone Encounter (Signed)
Patient received result at appointment 9/30 19

## 2018-01-13 ENCOUNTER — Other Ambulatory Visit: Payer: Self-pay | Admitting: Family Medicine

## 2018-07-05 ENCOUNTER — Telehealth: Payer: Self-pay | Admitting: *Deleted

## 2018-07-05 NOTE — Telephone Encounter (Signed)
   TELEPHONE CALL NOTE  This patient has been deemed a candidate for follow-up tele-health visit to limit community exposure during the Covid-19 pandemic. I spoke with the patient via phone to discuss instructions.  The patient will receive a phone call 2-3 days prior to their E-Visit at which time consent will be verbally confirmed. A Virtual Office Visit appointment type has been scheduled for 07/10/18 with dr harding, with  - patient prefers doxy /telephone type.  Tobin Chad, RN 07/05/2018 3:11 PM

## 2018-07-07 ENCOUNTER — Telehealth: Payer: Self-pay | Admitting: Cardiology

## 2018-07-07 NOTE — Telephone Encounter (Signed)
Smart phone/No MyChart/pre reg complete. 07-07-18 ST

## 2018-07-10 ENCOUNTER — Encounter: Payer: Self-pay | Admitting: Cardiology

## 2018-07-10 ENCOUNTER — Telehealth (INDEPENDENT_AMBULATORY_CARE_PROVIDER_SITE_OTHER): Payer: BLUE CROSS/BLUE SHIELD | Admitting: Cardiology

## 2018-07-10 VITALS — Ht 60.0 in

## 2018-07-10 DIAGNOSIS — I10 Essential (primary) hypertension: Secondary | ICD-10-CM

## 2018-07-10 DIAGNOSIS — E7849 Other hyperlipidemia: Secondary | ICD-10-CM

## 2018-07-10 DIAGNOSIS — I25119 Atherosclerotic heart disease of native coronary artery with unspecified angina pectoris: Secondary | ICD-10-CM

## 2018-07-10 DIAGNOSIS — Z87891 Personal history of nicotine dependence: Secondary | ICD-10-CM

## 2018-07-10 DIAGNOSIS — Z955 Presence of coronary angioplasty implant and graft: Secondary | ICD-10-CM

## 2018-07-10 NOTE — Assessment & Plan Note (Signed)
Blood pressure has been high.  Need to have the current blood pressure check.  She unfortunate does have debility to check her blood pressure at home, and has been restricted from last 2 to 3 weeks because of COVID-19. Plan will be to for her to get a blood pressure and heart rate check when she comes in for her lipids next month.

## 2018-07-10 NOTE — Progress Notes (Signed)
Virtual Visit via Video Note   This visit type was conducted due to national recommendations for restrictions regarding the COVID-19 Pandemic (e.g. social distancing) in an effort to limit this patient's exposure and mitigate transmission in our community.  Due to her co-morbid illnesses, this patient is at least at moderate risk for complications without adequate follow up.  This format is felt to be most appropriate for this patient at this time.  All issues noted in this document were discussed and addressed.  A limited physical exam was performed with this format.  Please refer to the patient's chart for her consent to telehealth for [facility].   Patient has given verbal permission to conduct this visit via virtual appointment and to bill insurance 07/07/2018 10:57 AM     Evaluation Performed:  Follow-up visit  Date:  07/10/2018   ID:  Ashley HainesLeticia Moran, DOB 05/15/1950, MRN [id number]  Patient Location: Home  Provider Location: Home  PCP:  Patient, No Pcp Per - has not seen since prior to [facility] in June 2019 Cardiologist:  Bryan Lemmaavid Chere Babson, MD  Electrophysiologist:  None   Chief Complaint: 956-month follow-up for angina-CAD   History of Present Illness:    Ashley HainesLeticia Moran is a 68 y.o. female who presents via audio/video conferencing for a telehealth visit today.     She has a history of unstable angina status post PCI to RCA (June 2019) with existing Circumflex disease treated medically (calcified long segment of Cx disease).   She was seen a couple times after her catheterization in June, most recently December 26, 2017.  We discussed her circumflex disease and decided to continue to treat medically as long as she is not having any episodes of recurrent progressive angina or exertional dyspnea. -->  She still can do her routine dancing.  Only notices palpitations when lying down at night. --> Apparently she had been off of Brilinta for about 2 months, but had no signs of recurrent  angina. --> Was having trouble with allergies. --> Quit smoking @ time of Cath-PCI  Interval History: Ashley BertholdLeticia seems to be doing very well.  She seems to be getting a little bit "stir crazy with cabin fever having to be at home" --not able to work and not seeing her daughter and granddaughter because of the COVID-19 restrictions.  But otherwise has been doing well.  She is just getting bored now.  Stable overall from a cardiac standpoint.  Not having chest pain. Uses stairs @ work - 3-4 floors. Worried about getting out of shape with COVED-19 restrictions, but otherwise doing well .   Cardiovascular ROS: no chest pain or dyspnea on exertion negative for - edema, irregular heartbeat, orthopnea, palpitations, paroxysmal nocturnal dyspnea, rapid heart rate, shortness of breath or syncope/near syncope, TIA/amarusosi fugax.  Melena, hematochezia, hematuria, epistaxis.  Has Knee problem -- OA pain & to leg muscles / cramps. L knee worse than R.  Present since pre-PCI. -She has been taking 500 mg aspirin tablets for her arthritis but is not having any GI issues.  This does not sound like claudication, but more joint pain related myalgia.  Still not smoking -since she quit back in June 2019 at the time of her PCI.  She has not seen her PCP in over a couple years, I did recommend that she reestablish with PCP -especially if nothing else to get her arthritis pains evaluated.  Review of Systems  Constitutional: Negative for malaise/fatigue.  HENT: Negative for congestion.   Respiratory: Negative  for cough (Not since she quit smoking), shortness of breath and wheezing.   Gastrointestinal: Positive for abdominal pain and heartburn.  Genitourinary: Negative for dysuria.  Musculoskeletal: Positive for joint pain (Per HPI).  Neurological: Negative for dizziness and focal weakness.  Endo/Heme/Allergies: Negative for environmental allergies.  Psychiatric/Behavioral: Negative.   All other systems reviewed  and are negative.  The patient does not have symptoms concerning for COVID-19 infection (fever, chills, cough, or new shortness of breath).   Temporarily out of work x 2 weeks. Just to Pharmacy & 1 x to grocery store.  Past Medical History:  Diagnosis Date  . Coronary artery disease involving native coronary artery of native heart with angina pectoris (HCC) 08/2017   Unstable Angina - heavy coronary Ca on CT, Inferior perfusion defect on Myoview -> Cath 90% mRCA (DES PCI), p-mCx 60-75% heavily calcified (Med Rx), mLAD 50%.   . Essential hypertension   . Heavy cigarette smoker (20-39 per day)    Trying to quit as of 08/2017  . Hyperlipidemia with target LDL less than 70    Past Surgical History:  Procedure Laterality Date  . CORONARY STENT INTERVENTION N/A 09/14/2017   Procedure: CORONARY STENT INTERVENTION;  Surgeon: Lennette Bihari, MD;  Location: [facility] INVASIVE CV LAB;  Service: Cardiovascular:: p-mRCA (covering both lesions): Resolute Onyx DES 2.5 mm x 26 mm (~2.6 mm)  . LEFT HEART CATH AND CORONARY ANGIOGRAPHY N/A 09/14/2017   Procedure: LEFT HEART CATH AND CORONARY ANGIOGRAPHY;  Surgeon: Lennette Bihari, MD;  Location: [facility] INVASIVE CV LAB;  Service: Cardiovascular: mRCA 90% & pRCA 50% -> DES PCI.  p-mCx ~80%, m&dCx 70-75% (on review ~60-65% with focal ~70% eccentric lesion - calcified), pLAD ~50%  . NM MYOVIEW LTD  09/13/2017   EF 73%.  Inferior-inferolateral perfusion defect consistent with ischemia.  . TRANSTHORACIC ECHOCARDIOGRAM  09/13/2017    Mild LVH.  EF 60 to 65%.  No RWMA.  GR 1 DD.     Current Meds  Medication Sig  . atorvastatin (LIPITOR) 40 MG tablet Take 1 tablet (40 mg total) by mouth daily at 6 PM.  . carvedilol (COREG) 25 MG tablet Take 1 tablet (25 mg total) by mouth 2 (two) times daily.  . clopidogrel (PLAVIX) 75 MG tablet Take 1 tablet (75 mg total) by mouth daily.  [name] lisinopril (PRINIVIL,ZESTRIL) 20 MG tablet Take 1 tablet (20 mg total) by mouth daily.  -- taking  Bayer joint 500 mg. Not 81 mg ASA.    Allergies:   Patient has no known allergies.   Social History   Tobacco Use  . Smoking status: Former Smoker    Packs/day: 0.50    Last attempt to quit: 09/12/2017    Years since quitting: 0.8  . Smokeless tobacco: Never Used  Substance Use Topics  . Alcohol use: No  . Drug use: No     Family Hx: The patient's family history includes Other in her mother.   Prior CV studies:   The following studies were reviewed today:   Cath-PCI 09/13/2018 - Resolute Oxyx DES 2.5 x 26 mm - RCA. Calcified Cx - Med Rx unless recurrent angina Diagnostic       Intervention          Echo 09/13/2017: LVEF 60-65%, mild LVH, normal wall motion, grade 1 DD, indeterminate LV filling pressure, trivial MR, normal LA size, trivial TR, RVSP 13 mmHg, normal IVC.  Labs/Other Tests and Data Reviewed:    EKG:  No ECG reviewed.  Recent Labs: 09/12/2017: B Natriuretic Peptide 50.0 09/15/2017: Hemoglobin 11.6; Platelets 296 12/13/2017: ALT 13; BUN 19; Creatinine, Ser 1.00; Potassium 5.1; Sodium 139   Recent Lipid Panel Lab Results  Component Value Date/Time   CHOL 146 12/13/2017 10:50 AM   TRIG 199 (H) 12/13/2017 10:50 AM   HDL 31 (L) 12/13/2017 10:50 AM   CHOLHDL 4.7 (H) 12/13/2017 10:50 AM   CHOLHDL 4.5 09/13/2017 08:26 AM   LDLCALC 75 12/13/2017 10:50 AM     Wt Readings from Last 3 Encounters:  12/26/17 134 lb 12.8 oz (61.1 kg)  09/20/17 126 lb 6.4 oz (57.3 kg)  09/15/17 125 lb 10.6 oz (57 kg)     Objective:    Vital Signs:  Ht 5' (1.524 m)   BMI 26.33 kg/m  ; last check in [facility] - BP was "good" Limited Visual Exam.  Well nourished, well developed female in no acute distress.  Healthy-appearing A&O x 3.  Normal mood & affect  No JVD.  Nonlabored breathing. No edema  ASSESSMENT & PLAN:    Problem List Items Addressed This Visit    Coronary artery disease involving native coronary artery of native heart with angina pectoris (HCC) - Primary (Chronic)     Two-vessel disease with PCI to the RCA and then calcified moderate severe disease in the circumflex.  No longer having any angina after PCI to the RCA suspecting that this was probably the culprit lesion.  For now we will continue to treat circumflex medically without PCI. Plan: Continue beta-blocker and ACE inhibitor along with statin.  On Plavix (she is taking high-dose Bayer aspirin so we are stopping her baby aspirin. --Told her to be careful about concerns for potential bleed)  Plan is for total of maybe 2 years Plavix, but okay to stop at this point for 5 to 7 days for procedures.  If she has recurrent anginal pains, would consider atherectomy give based PCI of the circumflex at that time.      Relevant Orders   Lipid panel   Comprehensive metabolic panel   Essential hypertension (Chronic)    Blood pressure has been high.  Need to have the current blood pressure check.  She unfortunate does have debility to check her blood pressure at home, and has been restricted from last 2 to 3 weeks because of COVID-19. Plan will be to for her to get a blood pressure and heart rate check when she comes in for her lipids next month.      Relevant Orders   Comprehensive metabolic panel   Former heavy cigarette smoker (20-39 per day) (Chronic)    Doing great with continued smoking cessation.  However she is saying that with her cabin fever from a COVID-19 restrictions, she is starting to want to smoke again.  I encouraged her to stay abstinent.      Hyperlipidemia due to dietary fat intake (Chronic)    Pretty close LDL to goal last visit.  She was post had labs checked before this visit however, because of covered 19 restrictions, would not check.  We will have her come in to get labs checked in roughly 1 month timeframe (get a blood pressure check at that time as well)  Continue current dose of statin for now and adjust according to follow-up levels.      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   Okay to hold Plavix 5 to 7 days preprocedure.  On ACE inhibitor, beta-blocker and atorvastatin  No longer on baby aspirin (is actually taking NSAIDs for arthritis pains)      Relevant Orders   Lipid panel   Comprehensive metabolic panel      COVID-19 Education: The signs and symptoms of COVID-19 were discussed with the patient and how to seek care for testing (follow up with PCP or arrange E-visit).  The importance of social distancing was discussed today.  Time:   Today, I have spent 15 minutes with the patient with telehealth technology discussing the above problems.     Medication Adjustments/Labs and Tests Ordered: Current medicines are reviewed at length with the patient today.  Concerns regarding medicines are outlined above.  Tests Ordered: Orders Placed This Encounter  Procedures  . Lipid panel  . Comprehensive metabolic panel    Medication Changes: No orders of the defined types were placed in this encounter.   Disposition:  Follow up Sept 2020  Signed, Bryan Lemma, MD  07/10/2018 4:43 PM    [facility]

## 2018-07-10 NOTE — Assessment & Plan Note (Signed)
Pretty close LDL to goal last visit.  She was post had labs checked before this visit however, because of covered 19 restrictions, would not check.  We will have her come in to get labs checked in roughly 1 month timeframe (get a blood pressure check at that time as well)  Continue current dose of statin for now and adjust according to follow-up levels.

## 2018-07-10 NOTE — Patient Instructions (Addendum)
Medication Instructions:   --Be careful with the high-dose Bayer aspirin.  You should not be taking the 81 mg baby aspirin when you are taking this.  I am also little bit concerned about potential bleeding risks.  However if this is helping the knee pain, then  we can continue for now.  Make sure you drink plenty of water (8-10 8 ounce glasses of water)  If you need a refill on your cardiac medications before your next appointment, please call your pharmacy.   Lab work:  - Need  LABS  (CMP/FLP)  checked soon- please come by office on a day you have not had something to eat or drink. (when you come in - would like to have BP checked).   If you have labs (blood work) drawn today and your tests are completely normal, you will receive your results only by: [name] MyChart Message (if you have MyChart) OR . A paper copy in the mail If you have any lab test that is abnormal or we need to change your treatment, we will call you to review the results.  Testing/Procedures: Not needed  Follow-Up: At [facility], you and your health needs are our priority.  As part of our continuing mission to provide you with exceptional heart care, we have created designated Provider Care Teams.  These Care Teams include your primary Cardiologist (physician) and Advanced Practice Providers (APPs -  Physician Assistants and Nurse Practitioners) who all work together to provide you with the care you need, when you need it. You will need a follow up appointment in Sept 2020 .  Please call our office 2 months in advance to schedule this appointment.  You may see Bryan Lemma, MD or one of the following Advanced Practice Providers on your designated Care Team:   Theodore Demark, PA-C . Joni Reining, DNP, ANP  Any Other Special Instructions Will Be Listed Below (If Applicable).

## 2018-07-10 NOTE — Assessment & Plan Note (Signed)
She is now on Plavix once daily.   Okay to hold Plavix 5 to 7 days preprocedure.  On ACE inhibitor, beta-blocker and atorvastatin  No longer on baby aspirin (is actually taking NSAIDs for arthritis pains)

## 2018-07-10 NOTE — Assessment & Plan Note (Signed)
Two-vessel disease with PCI to the RCA and then calcified moderate severe disease in the circumflex.  No longer having any angina after PCI to the RCA suspecting that this was probably the culprit lesion.  For now we will continue to treat circumflex medically without PCI. Plan: Continue beta-blocker and ACE inhibitor along with statin.  On Plavix (she is taking high-dose Bayer aspirin so we are stopping her baby aspirin. --Told her to be careful about concerns for potential bleed)  Plan is for total of maybe 2 years Plavix, but okay to stop at this point for 5 to 7 days for procedures.  If she has recurrent anginal pains, would consider atherectomy give based PCI of the circumflex at that time.

## 2018-07-10 NOTE — Assessment & Plan Note (Signed)
Doing great with continued smoking cessation.  However she is saying that with her cabin fever from a COVID-19 restrictions, she is starting to want to smoke again.  I encouraged her to stay abstinent.

## 2018-07-13 ENCOUNTER — Telehealth: Payer: Self-pay | Admitting: *Deleted

## 2018-07-13 ENCOUNTER — Telehealth: Payer: Self-pay | Admitting: Cardiology

## 2018-07-13 NOTE — Telephone Encounter (Signed)
LATE ENTRY - CALLED PATIENT REVIEWED INSTRUCTION FROM 4/13 TELEVISIT   AVS SUMMARY WILL BE MAILED TO HER PATIENT VOICE UNDERSTANDING

## 2018-09-11 ENCOUNTER — Other Ambulatory Visit: Payer: Self-pay | Admitting: Cardiology

## 2018-12-09 ENCOUNTER — Other Ambulatory Visit: Payer: Self-pay | Admitting: Cardiology

## 2018-12-11 ENCOUNTER — Other Ambulatory Visit: Payer: Self-pay

## 2018-12-11 ENCOUNTER — Ambulatory Visit: Payer: BC Managed Care – PPO | Admitting: Cardiology

## 2018-12-11 ENCOUNTER — Encounter: Payer: Self-pay | Admitting: Cardiology

## 2018-12-11 VITALS — BP 154/74 | HR 75 | Temp 96.6°F | Ht 60.0 in | Wt 138.2 lb

## 2018-12-11 DIAGNOSIS — E7849 Other hyperlipidemia: Secondary | ICD-10-CM | POA: Diagnosis not present

## 2018-12-11 DIAGNOSIS — I739 Peripheral vascular disease, unspecified: Secondary | ICD-10-CM

## 2018-12-11 DIAGNOSIS — I10 Essential (primary) hypertension: Secondary | ICD-10-CM

## 2018-12-11 DIAGNOSIS — I25119 Atherosclerotic heart disease of native coronary artery with unspecified angina pectoris: Secondary | ICD-10-CM

## 2018-12-11 DIAGNOSIS — Z955 Presence of coronary angioplasty implant and graft: Secondary | ICD-10-CM

## 2018-12-11 MED ORDER — HYDROCHLOROTHIAZIDE 25 MG PO TABS: 25.0000 mg | ORAL_TABLET | Freq: Every day | ORAL | 3 refills | Status: AC

## 2018-12-11 NOTE — Progress Notes (Signed)
PCP: Patient, No Pcp Per  Clinic Note: Chief Complaint  Patient presents with  . Follow-up    Mostly notes leg pain with walking.  . Coronary Artery Disease    No angina    HPI: Ashley Moran is Ashley 68 y.o. female with Ashley PMH of CAD-Angina-PCI RCA who presents today for 3 MONTH.  June 2019 -- Unstable Angina --> C PCI to RCA with existing circumflex disease..'  following her admission from June 17-20, 2019, when she underwent PCI to the RCA.  At that time we were deciding whether or not we would plan staged PCI of the circumflex with atherectomy versus simply medical management.  She had not had any further episodes of anginal chest pain with rest or exertion and noted minimal exertional dyspnea.  She had started with Ashley plan to quit smoking at that time.   Ashley Moran was last seen via Telehealth visit in April 2020  Recent Hospitalizations:   Studies Personally Reviewed - (if available, images/films reviewed: From Epic Chart or Care Everywhere)  LE V doppler post Cath -no evidence of DVT.  Interval History: Ashley Moran returns today actually doing fairly well.  Only notes issues with "leaking" with urination - has to wear Ashley pad.   Eating lots of fried foods over last week -- BP up today.  She also notes that she has not taken her blood pressure medications yet today. Has had more beer & "lady's drink" over last week.  Now going back to work tomorrow, so enjoyed the weekend. No HA, blurred vision.    Cardiovascular ROS: no chest pain or dyspnea on exertion positive for - R> L calf tightness wth activity negative for - edema, irregular heartbeat, orthopnea, palpitations, paroxysmal nocturnal dyspnea, rapid heart rate, shortness of breath or syncope / near syncope; TIA/amaurosis fugax,  + Claudication: Both knees hurt - climbing stairs & dancing.  Has to stop after 2-3 songs dancing 2/2 knee & right calf pain.  Notes Calf pain walking in to work.   ROS: Ashley comprehensive was  performed. Review of Systems  Constitutional: Negative for malaise/fatigue.  HENT: Negative for congestion and nosebleeds.   Eyes: Negative for blurred vision.  Respiratory: Positive for cough (much better). Negative for sputum production, shortness of breath and wheezing.   Cardiovascular: Positive for claudication. Negative for leg swelling (Per HPI).  Gastrointestinal: Negative for blood in stool, constipation (Chronic constipation), melena and nausea.  Genitourinary: Negative for dysuria.  Musculoskeletal: Positive for joint pain (Bilateral knee and hip pain). Negative for falls and myalgias.  Neurological: Negative for dizziness, weakness and headaches.  Psychiatric/Behavioral: Negative for memory loss. The patient is nervous/anxious. The patient does not have insomnia.   All other systems reviewed and are negative.  Quit smoking -- smokers cough is better  The patient does not have symptoms concerning for COVID-19 infection (fever, chills, cough, or new shortness of breath).  The patient is practicing social distancing.  I have reviewed and (if needed) personally updated the patient's problem list, medications, allergies, past medical and surgical history, social and family history.   Past Medical History:  Diagnosis Date  . Coronary artery disease involving native coronary artery of native heart with angina pectoris ([place]) 08/2017   Unstable Angina - heavy coronary Ca on CT, Inferior perfusion defect on Myoview -> Cath 90% mRCA (DES PCI), p-mCx 60-75% heavily calcified (Med Rx), mLAD 50%.   . Essential hypertension   . Heavy cigarette smoker (20-39 per day)  Trying to quit as of 08/2017  . Hyperlipidemia with target LDL less than 70     Past Surgical History:  Procedure Laterality Date  . CORONARY STENT INTERVENTION N/Ashley 09/14/2017   Procedure: CORONARY STENT INTERVENTION;  Surgeon: Lennette BihariKelly, Thomas A, MD;  [facility];  Service: Cardiovascular:: p-mRCA (covering  both lesions): Resolute Onyx DES 2.5 mm x 26 mm (~2.6 mm)  . LEFT HEART CATH AND CORONARY ANGIOGRAPHY N/Ashley 09/14/2017   Procedure: LEFT HEART CATH AND CORONARY ANGIOGRAPHY;  Surgeon: Lennette BihariKelly, Thomas A, MD;  Location: [facility] IN[facility]AS[facility] CV LAB;  Service: Cardiovascular: mRCA 90% & pRCA 50% -> DES PCI.  p-mCx ~80%, m&dCx 70-75% (on review ~60-65% with focal ~70% eccentric lesion - calcified), pLAD ~50%  . NM MYOVIEW LTD  09/13/2017   EF 73%.  Inferior-inferolateral perfusion defect consistent with ischemia.  . TRANSTHORACIC ECHOCARDIOGRAM  09/13/2017    Mild LVH.  EF 60 to 65%.  No RWMA.  GR 1 DD.    Ashley cardiac Cath-PCI September 14, 2017: mRCA 90% & pRCA 50% -> DES PCI.  p-mCx ~80%, m&dCx 70-75% (on review ~60-65% with focal ~70% eccentric lesion - calcified), pLAD ~50% --> PCI: p-mRCA (covering both lesions): Resolute Onyx DES 2.5 mm x 26 mm (~2.6 mm)    Current Meds  Medication Sig  . atorvastatin (LIPITOR) 40 MG tablet TAKE 1 TABLET (40 MG TOTAL) BY MOUTH DAILY AT 6 PM.  . lisinopril (ZESTRIL) 20 MG tablet TAKE 1 TABLET BY MOUTH EVERY DAY  . [DISCONTINUED] carvedilol (COREG) 25 MG tablet Take 1 tablet (25 mg total) by mouth 2 (two) times daily.  . [DISCONTINUED] clopidogrel (PLAVIX) 75 MG tablet Take 1 tablet (75 mg total) by mouth daily.    No Known Allergies  Social History   Tobacco Use  . Smoking status: Former Smoker    Packs/day: 0.50    Quit date: 09/12/2017    Years since quitting: 1.2  . Smokeless tobacco: Never Used  Substance Use Topics  . Alcohol use: No  . Drug use: No   Social History   Social History Narrative  . Not on file    family history includes Other in her mother.  Wt Readings from Last 3 Encounters:  12/11/18 138 lb 3.2 oz (62.7 kg)  12/26/17 134 lb 12.8 oz (61.1 kg)  09/20/17 126 lb 6.4 oz (57.3 kg)    PHYSICAL EXAM BP (!) 154/74   Pulse 75   Temp (!) 96.6 F (35.9 C)   Ht 5' (1.524 m)   Wt 138 lb 3.2 oz (62.7 kg)   SpO2 95%   BMI 26.99 kg/m  -->  Recheck blood pressure 154/74 mmHg (she has yet to take her carvedilol or lisinopril this morning) Physical Exam  Constitutional: She is oriented to person, place, and time. She appears well-developed and well-nourished. No distress.  Healthy-appearing.  Well-groomed  HENT:  Head: Normocephalic and atraumatic.  Poor dentition, no missing teeth, significant gum disease.  Neck: Normal range of motion. Neck supple. No hepatojugular reflux and no JVD present. Carotid bruit is not present. No tracheal deviation present. No thyromegaly present.  Cardiovascular: Normal rate, regular rhythm, S1 normal, intact distal pulses and normal pulses.  No extrasystoles are present. PMI is not displaced. Exam reveals no gallop and no friction rub.  No murmur heard. Split S2 from RBBB; cannot exclude minimally reduced posterior tibial pulses in both feet.  Pulmonary/Chest: Effort normal and breath sounds normal. No respiratory distress. She has  no wheezes. She has no rales.  Abdominal: Soft. Bowel sounds are normal. She exhibits no distension. There is no abdominal tenderness. There is no rebound.  No HSM  Musculoskeletal: Normal range of motion.        General: No edema.  Neurological: She is alert and oriented to person, place, and time.  Skin:  Mild bilateral lower extremity spider nevi and varicosities.  Psychiatric: She has Ashley normal mood and affect. Her behavior is normal. Judgment and thought content normal.  Vitals reviewed.    Adult ECG Report  Rate: 75;  Rhythm: normal sinus rhythm and RBBB otherwise normal axis and durations..,  Resting tremor;   Narrative Interpretation: Stable EKG   Other studies Reviewed: Additional studies/ records that were reviewed today include:  Recent Labs:   Labs are due to be checked Lab Results  Component Value Date   CHOL 146 12/13/2017   HDL 31 (L) 12/13/2017   LDLCALC 75 12/13/2017   TRIG 199 (H) 12/13/2017   CHOLHDL 4.7 (H) 12/13/2017   Lab Results   Component Value Date   CREATININE 1.00 12/13/2017   BUN 19 12/13/2017   NA 139 12/13/2017   K 5.1 12/13/2017   CL 101 12/13/2017   CO2 22 12/13/2017    ASSESSMENT / PLAN: Problem List Items Addressed This Visit    Coronary artery disease involving native coronary artery of native heart with angina pectoris (HCC) (Chronic)    He is initially evaluated for angina and found to have two-vessel disease.  Most significant was noted to be in the RCA that was treated with DES PCI.  She had moderate disease in the circumflex which were now even treated medically, she has further anginal symptoms.  Currently not actively having angina on carvedilol.  Not requiring any nitrate.  He is on moderate dose atorvastatin along with ACE inhibitor and clopidogrel.  As long she is not having active anginal symptoms, we will continue to treat circumflex disease medically.      Relevant Medications   hydrochlorothiazide (HYDRODIURIL) 25 MG tablet   Other Relevant Orders   EKG 12-Lead (Completed)   Lipid panel   VAS [place] LOWER EXTREMITY ARTERIAL DUPLEX   Essential hypertension (Chronic)    Blood pressure was very high but she first came in today, but she was totally asymptomatic.  There is lots of reasons that can explain it including dietary discretion over the past week or so, increase alcohol intake over the last couple weeks and that she has not taken her blood pressure medication today.  She goes back to work as of tomorrow and is off on vacation so I anticipate that this will improve. Recheck blood pressure was notably improved.  Plan: Start HCTZ 25 mg daily.  She will take her carvedilol and lisinopril when she gets home today.  We will have her follow-up with blood pressure check on Thursday when she gets her labs done.  (We will try to get her lower extremity Dopplers on the same time)  Blood pressure follow-up with CVRR in 1 month.      Relevant Medications   hydrochlorothiazide  (HYDRODIURIL) 25 MG tablet   Hyperlipidemia due to dietary fat intake (Chronic)    1 year ago, her labs look pretty close to goal.  Has not had labs checked in over Ashley year.  Will order lipid panel the next few weeks.  Continue current dose of statin      Relevant Medications   hydrochlorothiazide (HYDRODIURIL) 25  MG tablet   Other Relevant Orders   Lipid panel   Intermittent claudication (HCC) - Primary    She had mentioned having some cramping before, but this was Ashley preterm and she really indicates that is clearly associated with exertion D 100 feet, better with rest.  I am concerned that she has significant PAD.  Plan: LE Dopplers, and if indicated refer to vascular cardiology  Will continue statin and Plavix on blood pressure control.  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  No evidence of bleeding.  Plan: We will continue Plavix monotherapy because of her existing CAD and likely PAD with claudication.  Okay to hold Plavix 5 to 7 days preprocedure  On stable dose of beta-blocker, ACE inhibitor and atorvastatin.        COVID-19 Education: The signs and symptoms of COVID-19 were discussed with the patient and how to seek care for testing (follow up with PCP or arrange E-visit).   The importance of social distancing was discussed today.  I spent Ashley total of 25 minutes with the patient and chart review. >  50% of the time was spent in direct patient consultation.   Current medicines are reviewed at length with the patient today.  (+/- concerns) -  Has yet to take blood pressure medications this morning The following changes have been made:  see below.  Patient Instructions  Medication Instructions:   START HCTZ ( HYDROCHLOROTHIAZIDE) 25 MG TAKE IN THE MORNINGS DAILY   TAKE YOUR CARVEDILOL WHEN YOU EGET HOME  TODAY  If you need Ashley refill on your cardiac medications before your next appointment, please call your pharmacy.   Lab work:  LIPID  -- ON Thursday   SEPT 17 ,2020  If you have labs (blood work) drawn today and your tests are completely normal, you will receive your results only by: [name] MyChart Message (if you have MyChart) OR . Ashley paper copy in the mail If you have any lab test that is abnormal or we need to change your treatment, we will call you to review the results.  Testing/Procedures: WILL BE SCHEDULE AT [address]  Your physician has requested that you have Ashley lower extremity arterial duplex. This test is an ultrasound of the arteries in the legs . It looks at arterial blood flow in the legs. Allow one hour for Lower Arterial scans. There are no restrictions or special instructions AND Your physician has requested that you have an ankle brachial index (ABI). During this test an ultrasound and blood pressure cuff are used to evaluate the arteries that supply the arms and legs with blood. Allow thirty minutes for this exam. There are no restrictions or special instructions.    Follow-Up: At [facility], you and your health needs are our priority.  As part of our continuing mission to provide you with exceptional heart care, we have created designated Provider Care Teams.  These Care Teams include your primary Cardiologist (physician) and Advanced Practice Providers (APPs -  Physician Assistants and Nurse Practitioners) who all work together to provide you with the care you need, when you need it. You will need Ashley follow up appointment in 4  Months JAN 2021.  Please call our office 2 months in advance to  schedule this appointment.  You may see Bryan Lemmaavid Harding, MD  Your physician recommends that you schedule Ashley follow-up appointment ON Thursday SEPT 17,2020- BLOOD PRESSURE CHECK - NURSE  Your physician recommends that you schedule Ashley follow-up appointment in 1 MONTH  WITH CVRR  - BLOOD PRESSURE   Any Other Special Instructions Will Be Listed Below .  Studies Ordered:   Orders Placed This Encounter  Procedures  . Lipid panel  . EKG 12-Lead  . VAS US ABI WITH/WO TBI  . VAS US LOWER EXTREMITY ARTERIAL DUPLEX     Bryan Lemmaavid Harding, M.D., M.S. Interventional Cardiologist   Pager # [phone] Phone # [phone] [address] [place], [place] [address]   Thank you for choosing [facility] at [facility]!!

## 2018-12-11 NOTE — Patient Instructions (Addendum)
Medication Instructions:   START HCTZ ( HYDROCHLOROTHIAZIDE) 25 MG TAKE IN THE MORNINGS DAILY   TAKE YOUR CARVEDILOL WHEN YOU EGET HOME TODAY  If you need a refill on your cardiac medications before your next appointment, please call your pharmacy.   Lab work:  LIPID  -- ON Thursday   SEPT 17 ,2020  If you have labs (blood work) drawn today and your tests are completely normal, you will receive your results only by: [name] MyChart Message (if you have MyChart) OR . A paper copy in the mail If you have any lab test that is abnormal or we need to change your treatment, we will call you to review the results.  Testing/Procedures: WILL BE SCHEDULE AT [place]  Your physician has requested that you have a lower extremity arterial duplex. This test is an ultrasound of the arteries in the legs . It looks at arterial blood flow in the legs. Allow one hour for Lower Arterial scans. There are no restrictions or special instructions AND Your physician has requested that you have an ankle brachial index (ABI). During this test an ultrasound and blood pressure cuff are used to evaluate the arteries that supply the arms and legs with blood. Allow thirty minutes for this exam. There are no restrictions or special instructions.    Follow-Up: At [facility], you and your health needs are our priority.  As part of our continuing mission to provide you with exceptional heart care, we have created designated Provider Care Teams.  These Care Teams include your primary Cardiologist (physician) and Advanced Practice Providers (APPs -  Physician Assistants and Nurse Practitioners) who all work together to provide you with the care you need, when you need it. You will need a follow up appointment in 4  Months JAN 2021.  Please call our office 2 months in advance to schedule this appointment.  You may see Glenetta Hew, MD  Your physician recommends that you schedule a follow-up appointment ON  Thursday SEPT 17,2020- BLOOD PRESSURE CHECK - NURSE  Your physician recommends that you schedule a follow-up appointment in 1 MONTH  WITH CVRR - BLOOD PRESSURE   Any Other Special Instructions Will Be Listed Below .

## 2018-12-13 ENCOUNTER — Encounter: Payer: Self-pay | Admitting: Cardiology

## 2018-12-13 NOTE — Assessment & Plan Note (Signed)
She had mentioned having some cramping before, but this was a preterm and she really indicates that is clearly associated with exertion D 100 feet, better with rest.  I am concerned that she has significant PAD.  Plan: LE Dopplers, and if indicated refer to vascular cardiology  Will continue statin and Plavix on blood pressure control.  Consider Pletal

## 2018-12-13 NOTE — Assessment & Plan Note (Addendum)
Blood pressure was very high but she first came in today, but she was totally asymptomatic.  There is lots of reasons that can explain it including dietary discretion over the past week or so, increase alcohol intake over the last couple weeks and that she has not taken her blood pressure medication today.  She goes back to work as of tomorrow and is off on vacation so I anticipate that this will improve. Recheck blood pressure was notably improved.  Plan: Start HCTZ 25 mg daily.  She will take her carvedilol and lisinopril when she gets home today.  We will have her follow-up with blood pressure check on Thursday when she gets her labs done.  (We will try to get her lower extremity Dopplers on the same time)  Blood pressure follow-up with CVRR in 1 month.

## 2018-12-13 NOTE — Assessment & Plan Note (Signed)
Doing well on Plavix alone.  No evidence of bleeding.  Plan: We will continue Plavix monotherapy because of her existing CAD and likely PAD with claudication.  Okay to hold Plavix 5 to 7 days preprocedure  On stable dose of beta-blocker, ACE inhibitor and atorvastatin.

## 2018-12-13 NOTE — Assessment & Plan Note (Signed)
He is initially evaluated for angina and found to have two-vessel disease.  Most significant was noted to be in the RCA that was treated with DES PCI.  She had moderate disease in the circumflex which were now even treated medically, she has further anginal symptoms.  Currently not actively having angina on carvedilol.  Not requiring any nitrate.  He is on moderate dose atorvastatin along with ACE inhibitor and clopidogrel.  As long she is not having active anginal symptoms, we will continue to treat circumflex disease medically.

## 2018-12-13 NOTE — Assessment & Plan Note (Signed)
1 year ago, her labs look pretty close to goal.  Has not had labs checked in over a year.  Will order lipid panel the next few weeks.  Continue current dose of statin

## 2018-12-15 ENCOUNTER — Ambulatory Visit (HOSPITAL_COMMUNITY)
Admission: RE | Admit: 2018-12-15 | Discharge: 2018-12-15 | Disposition: A | Discharge status: Home / Self Care | Payer: Medicare Other | Source: Ambulatory Visit | Attending: Cardiology | Admitting: Cardiology

## 2018-12-15 ENCOUNTER — Ambulatory Visit (INDEPENDENT_AMBULATORY_CARE_PROVIDER_SITE_OTHER): Payer: BC Managed Care – PPO

## 2018-12-15 ENCOUNTER — Other Ambulatory Visit: Payer: Self-pay

## 2018-12-15 VITALS — BP 128/58 | HR 69 | Ht 60.0 in | Wt 137.0 lb

## 2018-12-15 DIAGNOSIS — E7849 Other hyperlipidemia: Secondary | ICD-10-CM | POA: Diagnosis not present

## 2018-12-15 DIAGNOSIS — I739 Peripheral vascular disease, unspecified: Secondary | ICD-10-CM | POA: Insufficient documentation

## 2018-12-15 DIAGNOSIS — I10 Essential (primary) hypertension: Secondary | ICD-10-CM | POA: Diagnosis not present

## 2018-12-15 DIAGNOSIS — I25119 Atherosclerotic heart disease of native coronary artery with unspecified angina pectoris: Secondary | ICD-10-CM | POA: Diagnosis not present

## 2018-12-15 LAB — LIPID PANEL
Chol/HDL Ratio: 4.7 ratio — ABNORMAL HIGH (ref 0.0–4.4)
Cholesterol, Total: 147 mg/dL (ref 100–199)
HDL: 31 mg/dL — ABNORMAL LOW (ref >39)
LDL Chol Calc (NIH): 63 mg/dL (ref 0–99)
Triglycerides: 340 mg/dL — ABNORMAL HIGH (ref 0–149)
VLDL Cholesterol Cal: 53 mg/dL — ABNORMAL HIGH (ref 5–40)

## 2018-12-15 NOTE — Progress Notes (Signed)
Late entry Pt is here for BP check for Dr Ashok Pall given to [facility] nurse to review with [facility]

## 2019-01-02 ENCOUNTER — Encounter: Payer: Self-pay | Admitting: *Deleted

## 2019-01-02 ENCOUNTER — Telehealth: Payer: Self-pay | Admitting: *Deleted

## 2019-01-02 NOTE — Telephone Encounter (Signed)
We have attempted to reach the patient unsuccessfully to make an appointment with Dr. Fletcher Anon concerning her results. This is the 6th messages that has been left.  A letter will be mailed advising the patient to call the office to discuss her results and to make an appointment.

## 2019-01-02 NOTE — Telephone Encounter (Signed)
-----   Message from Wellington Hampshire, MD sent at 12/18/2018  4:03 PM EDT ----- Thanks Shanon Brow.   Lattie Haw, please schedule this patient with me in few weeks.   ----- Message ----- From: Leonie Man, MD Sent: 12/17/2018  11:06 PM EDT To: Raiford Simmonds, RN, No Pcp Per Patient, #  Lower extremity arterial Dopplers suggests significant disease on the left side and moderate on the right. 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  -> With her having pretty significant claudication symptoms, I think we probably ought to refer to vascular cardiology - Dr. Fletcher Anon or Dr. Gwenlyn Found for consideration of possible percutaneous evaluation.  Recommend continued walking, and stopping if necessary but continue to walk.  This helps build collaterals.  Glenetta Hew, MD

## 2019-01-06 ENCOUNTER — Other Ambulatory Visit: Payer: Self-pay | Admitting: Cardiology

## 2019-01-15 DIAGNOSIS — Z1231 Encounter for screening mammogram for malignant neoplasm of breast: Secondary | ICD-10-CM | POA: Diagnosis not present

## 2019-02-05 ENCOUNTER — Encounter: Payer: Self-pay | Admitting: *Deleted

## 2019-02-06 ENCOUNTER — Other Ambulatory Visit: Payer: Self-pay

## 2019-02-06 ENCOUNTER — Ambulatory Visit: Payer: BC Managed Care – PPO | Admitting: Cardiovascular Disease

## 2019-02-06 VITALS — BP 182/90 | HR 87 | Ht 59.0 in | Wt 137.0 lb

## 2019-02-06 DIAGNOSIS — I739 Peripheral vascular disease, unspecified: Secondary | ICD-10-CM | POA: Diagnosis not present

## 2019-02-06 DIAGNOSIS — E785 Hyperlipidemia, unspecified: Secondary | ICD-10-CM | POA: Diagnosis not present

## 2019-02-06 DIAGNOSIS — I10 Essential (primary) hypertension: Secondary | ICD-10-CM | POA: Diagnosis not present

## 2019-02-06 DIAGNOSIS — I2510 Atherosclerotic heart disease of native coronary artery without angina pectoris: Secondary | ICD-10-CM

## 2019-02-06 NOTE — Patient Instructions (Addendum)
    [place] [address] [place] [address] Dept: [phone] Loc: [place]  02/06/2019  You are scheduled for a Peripheral Angiogram on Wednesday, November 18 with Dr. Kathlyn Sacramento.  1. Please arrive at the [facility] (Main Entrance A) at [facility]: [address] [place], [place] [address] at 6:30 AM (This time is two hours before your procedure to ensure your preparation). Free valet parking service is available.   Special note: Every effort is made to have your procedure done on time. Please understand that emergencies sometimes delay scheduled procedures.  2. Diet: Do not eat solid foods after midnight.  The patient may have clear liquids until 5am upon the day of the procedure.  3. Labs: You will need to have blood drawn on TODAY  YOU WILL NEED TO GO TO [address] ON Saturday 02-10-2019 @ 10 AM FOR COVID TESTING=PRE-PROCEDURE LINE  4. Medication instructions in preparation for your procedure:  On the morning of your procedure, take your PLAVIX/CLOPIDOGREL  and any morning medicines NOT listed above.  You may use sips of water.  5. Plan for one night stay--bring personal belongings. 6. Bring a current list of your medications and current insurance cards. 7. You MUST have a responsible person to drive you home. 8. Someone MUST be with you the first 24 hours after you arrive home or your discharge will be delayed. 9. Please wear clothes that are easy to get on and off and wear slip-on shoes.  Thank you for allowing [place] to care for you!   --  Invasive Cardiovascular services   Your physician recommends that you schedule a follow-up appointment in: [place]

## 2019-02-06 NOTE — H&P (View-Only) (Signed)
Cardiology Office Note   Date:  02/08/2019   ID:  Ashley Moran, DOB 28-Aug-1950, MRN [id number]  PCP:  Patient, No Pcp Per  Cardiologist:   Dr. Ellyn Hack  No chief complaint on file.     History of Present Illness: Ashley Moran is a 68 y.o. female who was referred by Dr. Ellyn Hack for evaluation and management of peripheral arterial disease.  She has known history of coronary artery disease status post RCA PCI, tobacco use, essential hypertension and hyperlipidemia. She recently complained of bilateral calf claudication . She underwent vascular studies which showed an ABI of 0.72 on the right and 0.57 on the left.  Duplex showed moderate iliac disease with occluded proximal to mid left SFA.  On the right, there was moderate SFA disease. She noticed claudication about 3 years ago and this has been gradually getting worse.  She is usually very active and likes to dance for exercise but she has not been able to do as much as before due to her symptoms.  No rest pain or lower extremity ulceration. She denies chest pain.  She has mild exertional dyspnea.  She quit smoking in 2019 and she is not diabetic.  Past Medical History:  Diagnosis Date  . Coronary artery disease involving native coronary artery of native heart with angina pectoris ([facility]) 08/2017   Unstable Angina - heavy coronary Ca on CT, Inferior perfusion defect on Myoview -> Cath 90% mRCA (DES PCI), p-mCx 60-75% heavily calcified (Med Rx), mLAD 50%.   . Essential hypertension   . Heavy cigarette smoker (20-39 per day)    Trying to quit as of 08/2017  . Hyperlipidemia with target LDL less than 70     Past Surgical History:  Procedure Laterality Date  . CORONARY STENT INTERVENTION N/A 09/14/2017   Procedure: CORONARY STENT INTERVENTION;  Surgeon: Troy Sine, MD;  Location: [facility];  Service: Cardiovascular:: p-mRCA (covering both lesions): Resolute Onyx DES 2.5 mm x 26 mm (~2.6 mm)  . LEFT HEART CATH AND  CORONARY ANGIOGRAPHY N/A 09/14/2017   Procedure: LEFT HEART CATH AND CORONARY ANGIOGRAPHY;  Surgeon: Troy Sine, MD;  Location: [facility];  Service: Cardiovascular: mRCA 90% & pRCA 50% -> DES PCI.  p-mCx ~80%, m&dCx 70-75% (on review ~60-65% with focal ~70% eccentric lesion - calcified), pLAD ~50%  . NM MYOVIEW LTD  09/13/2017   EF 73%.  Inferior-inferolateral perfusion defect consistent with ischemia.  . TRANSTHORACIC ECHOCARDIOGRAM  09/13/2017    Mild LVH.  EF 60 to 65%.  No RWMA.  GR 1 DD.     Current Outpatient Medications  Medication Sig Dispense Refill  . Aspirin-Caffeine (BACK & BODY EXTRA STRENGTH PO) Take by mouth.    [name] atorvastatin (LIPITOR) 40 MG tablet TAKE 1 TABLET (40 MG TOTAL) BY MOUTH DAILY AT 6 PM. 90 tablet 3  . carvedilol (COREG) 25 MG tablet TAKE 1 TABLET BY MOUTH TWICE A DAY 180 tablet 3  . clopidogrel (PLAVIX) 75 MG tablet TAKE 1 TABLET BY MOUTH EVERY DAY 90 tablet 3  . hydrochlorothiazide (HYDRODIURIL) 25 MG tablet Take 1 tablet (25 mg total) by mouth daily. 90 tablet 3  . lisinopril (ZESTRIL) 20 MG tablet TAKE 1 TABLET BY MOUTH EVERY DAY 90 tablet 3   No current facility-administered medications for this visit.     Allergies:   Patient has no known allergies.    Social History:  The patient  reports that she quit smoking about  16 months ago. She smoked 0.50 packs per day. She has never used smokeless tobacco. She reports that she does not drink alcohol or use drugs.   Family History:  The patient's family history includes Other in her mother.    ROS:  Please see the history of present illness.   Otherwise, review of systems are positive for none.   All other systems are reviewed and negative.    PHYSICAL EXAM: VS:  BP (!) 182/90   Pulse 87   Ht 4\' 11"  (1.499 m)   Wt 137 lb (62.1 kg)   SpO2 98%   BMI 27.67 kg/m  , BMI Body mass index is 27.67 kg/m. GEN: Well nourished, well developed, in no acute distress  HEENT: normal  Neck: no JVD,  carotid bruits, or masses Cardiac: RRR; no  rubs, or gallops,no edema .  2 out of 6 systolic murmur at the base Respiratory:  clear to auscultation bilaterally, normal work of breathing GI: soft, nontender, nondistended, + BS MS: no deformity or atrophy  Skin: warm and dry, no rash Neuro:  Strength and sensation are intact Psych: euthymic mood, full affect Vascular: Femoral pulses +1 bilaterally.  Dorsalis pedis is +2 on the right and +1 on the left.  Posterior tibial is not palpable.   EKG:  EKG is not ordered today.    Recent Labs: 02/06/2019: BUN 27; Creatinine, Ser 1.41; Hemoglobin 11.5; Platelets 399; Potassium 5.2; Sodium 139    Lipid Panel    Component Value Date/Time   CHOL 147 12/15/2018 1002   TRIG 340 (H) 12/15/2018 1002   HDL 31 (L) 12/15/2018 1002   CHOLHDL 4.7 (H) 12/15/2018 1002   CHOLHDL 4.5 09/13/2017 0826   VLDL 24 09/13/2017 0826   LDLCALC 63 12/15/2018 1002      Wt Readings from Last 3 Encounters:  02/06/19 137 lb (62.1 kg)  12/15/18 137 lb (62.1 kg)  12/11/18 138 lb 3.2 oz (62.7 kg)        No flowsheet data found.    ASSESSMENT AND PLAN:  1.  Peripheral arterial disease: Severe bilateral calf claudication worse on the left side due to SFA disease.  I discussed with her the natural history and management of claudication.  Fortunately, she is not diabetic and she quit smoking last year.  She feels extremely limited by her symptoms especially that she likes to stay active and dances for exercise.  She has not been able to do so recently due to her symptoms.  I discussed with her the option of a structured exercise program but she feels as if her symptoms are very limiting at the present time and she does not think she will be able to improve her current level of functioning.  Due to that, I recommend proceeding with abdominal aortogram with lower extremity runoff and possible endovascular intervention.  Planned access via the right common femoral  artery.  2.  Coronary artery disease involving native coronary arteries without angina: She is doing well with no anginal symptoms.  Continue medical therapy.  3.  Tobacco use: She quit smoking in 2019.  4.  Essential hypertension: She likely has some component of whitecoat syndrome.  Her blood pressure is always elevated at the physician's office.  5.  Hyperlipidemia: Continue treatment with atorvastatin.  Lipid profile in September showed an LDL of 63.  Triglyceride was 340 and thus should consider Vascepa.    Disposition:   FU with me in 1 month  Signed,  October  Kirke CorinArida, MD  02/08/2019 4:56 PM    [facility]

## 2019-02-06 NOTE — Progress Notes (Signed)
Cardiology Office Note   Date:  02/08/2019   ID:  Ashley Moran, DOB 28-Aug-1950, MRN [id number]  PCP:  Patient, No Pcp Per  Cardiologist:   Dr. Ellyn Hack  No chief complaint on file.     History of Present Illness: Ashley Moran is a 68 y.o. female who was referred by Dr. Ellyn Hack for evaluation and management of peripheral arterial disease.  She has known history of coronary artery disease status post RCA PCI, tobacco use, essential hypertension and hyperlipidemia. She recently complained of bilateral calf claudication . She underwent vascular studies which showed an ABI of 0.72 on the right and 0.57 on the left.  Duplex showed moderate iliac disease with occluded proximal to mid left SFA.  On the right, there was moderate SFA disease. She noticed claudication about 3 years ago and this has been gradually getting worse.  She is usually very active and likes to dance for exercise but she has not been able to do as much as before due to her symptoms.  No rest pain or lower extremity ulceration. She denies chest pain.  She has mild exertional dyspnea.  She quit smoking in 2019 and she is not diabetic.  Past Medical History:  Diagnosis Date  . Coronary artery disease involving native coronary artery of native heart with angina pectoris ([facility]) 08/2017   Unstable Angina - heavy coronary Ca on CT, Inferior perfusion defect on Myoview -> Cath 90% mRCA (DES PCI), p-mCx 60-75% heavily calcified (Med Rx), mLAD 50%.   . Essential hypertension   . Heavy cigarette smoker (20-39 per day)    Trying to quit as of 08/2017  . Hyperlipidemia with target LDL less than 70     Past Surgical History:  Procedure Laterality Date  . CORONARY STENT INTERVENTION N/A 09/14/2017   Procedure: CORONARY STENT INTERVENTION;  Surgeon: Troy Sine, MD;  Location: [facility];  Service: Cardiovascular:: p-mRCA (covering both lesions): Resolute Onyx DES 2.5 mm x 26 mm (~2.6 mm)  . LEFT HEART CATH AND  CORONARY ANGIOGRAPHY N/A 09/14/2017   Procedure: LEFT HEART CATH AND CORONARY ANGIOGRAPHY;  Surgeon: Troy Sine, MD;  Location: [facility];  Service: Cardiovascular: mRCA 90% & pRCA 50% -> DES PCI.  p-mCx ~80%, m&dCx 70-75% (on review ~60-65% with focal ~70% eccentric lesion - calcified), pLAD ~50%  . NM MYOVIEW LTD  09/13/2017   EF 73%.  Inferior-inferolateral perfusion defect consistent with ischemia.  . TRANSTHORACIC ECHOCARDIOGRAM  09/13/2017    Mild LVH.  EF 60 to 65%.  No RWMA.  GR 1 DD.     Current Outpatient Medications  Medication Sig Dispense Refill  . Aspirin-Caffeine (BACK & BODY EXTRA STRENGTH PO) Take by mouth.    [name] atorvastatin (LIPITOR) 40 MG tablet TAKE 1 TABLET (40 MG TOTAL) BY MOUTH DAILY AT 6 PM. 90 tablet 3  . carvedilol (COREG) 25 MG tablet TAKE 1 TABLET BY MOUTH TWICE A DAY 180 tablet 3  . clopidogrel (PLAVIX) 75 MG tablet TAKE 1 TABLET BY MOUTH EVERY DAY 90 tablet 3  . hydrochlorothiazide (HYDRODIURIL) 25 MG tablet Take 1 tablet (25 mg total) by mouth daily. 90 tablet 3  . lisinopril (ZESTRIL) 20 MG tablet TAKE 1 TABLET BY MOUTH EVERY DAY 90 tablet 3   No current facility-administered medications for this visit.     Allergies:   Patient has no known allergies.    Social History:  The patient  reports that she quit smoking about  16 months ago. She smoked 0.50 packs per day. She has never used smokeless tobacco. She reports that she does not drink alcohol or use drugs.   Family History:  The patient's family history includes Other in her mother.    ROS:  Please see the history of present illness.   Otherwise, review of systems are positive for none.   All other systems are reviewed and negative.    PHYSICAL EXAM: VS:  BP (!) 182/90   Pulse 87   Ht 4\' 11"  (1.499 m)   Wt 137 lb (62.1 kg)   SpO2 98%   BMI 27.67 kg/m  , BMI Body mass index is 27.67 kg/m. GEN: Well nourished, well developed, in no acute distress  HEENT: normal  Neck: no JVD,  carotid bruits, or masses Cardiac: RRR; no  rubs, or gallops,no edema .  2 out of 6 systolic murmur at the base Respiratory:  clear to auscultation bilaterally, normal work of breathing GI: soft, nontender, nondistended, + BS MS: no deformity or atrophy  Skin: warm and dry, no rash Neuro:  Strength and sensation are intact Psych: euthymic mood, full affect Vascular: Femoral pulses +1 bilaterally.  Dorsalis pedis is +2 on the right and +1 on the left.  Posterior tibial is not palpable.   EKG:  EKG is not ordered today.    Recent Labs: 02/06/2019: BUN 27; Creatinine, Ser 1.41; Hemoglobin 11.5; Platelets 399; Potassium 5.2; Sodium 139    Lipid Panel    Component Value Date/Time   CHOL 147 12/15/2018 1002   TRIG 340 (H) 12/15/2018 1002   HDL 31 (L) 12/15/2018 1002   CHOLHDL 4.7 (H) 12/15/2018 1002   CHOLHDL 4.5 09/13/2017 0826   VLDL 24 09/13/2017 0826   LDLCALC 63 12/15/2018 1002      Wt Readings from Last 3 Encounters:  02/06/19 137 lb (62.1 kg)  12/15/18 137 lb (62.1 kg)  12/11/18 138 lb 3.2 oz (62.7 kg)        No flowsheet data found.    ASSESSMENT AND PLAN:  1.  Peripheral arterial disease: Severe bilateral calf claudication worse on the left side due to SFA disease.  I discussed with her the natural history and management of claudication.  Fortunately, she is not diabetic and she quit smoking last year.  She feels extremely limited by her symptoms especially that she likes to stay active and dances for exercise.  She has not been able to do so recently due to her symptoms.  I discussed with her the option of a structured exercise program but she feels as if her symptoms are very limiting at the present time and she does not think she will be able to improve her current level of functioning.  Due to that, I recommend proceeding with abdominal aortogram with lower extremity runoff and possible endovascular intervention.  Planned access via the right common femoral  artery.  2.  Coronary artery disease involving native coronary arteries without angina: She is doing well with no anginal symptoms.  Continue medical therapy.  3.  Tobacco use: She quit smoking in 2019.  4.  Essential hypertension: She likely has some component of whitecoat syndrome.  Her blood pressure is always elevated at the physician's office.  5.  Hyperlipidemia: Continue treatment with atorvastatin.  Lipid profile in September showed an LDL of 63.  Triglyceride was 340 and thus should consider Vascepa.    Disposition:   FU with me in 1 month  Signed,  October  Nathifa Ritthaler, MD  02/08/2019 4:56 PM    [facility] 

## 2019-02-07 LAB — CBC
Hematocrit: 38.7 % (ref 34.0–46.6)
Hemoglobin: 11.5 g/dL (ref 11.1–15.9)
MCH: 22.5 pg — ABNORMAL LOW (ref 26.6–33.0)
MCHC: 29.7 g/dL — ABNORMAL LOW (ref 31.5–35.7)
MCV: 76 fL — ABNORMAL LOW (ref 79–97)
Platelets: 399 10*3/uL (ref 150–450)
RBC: 5.12 x10E6/uL (ref 3.77–5.28)
RDW: 16.2 % — ABNORMAL HIGH (ref 11.7–15.4)
WBC: 5 10*3/uL (ref 3.4–10.8)

## 2019-02-07 LAB — BASIC METABOLIC PANEL
BUN/Creatinine Ratio: 19 (ref 12–28)
BUN: 27 mg/dL (ref 8–27)
CO2: 18 mmol/L — ABNORMAL LOW (ref 20–29)
Calcium: 9.8 mg/dL (ref 8.7–10.3)
Chloride: 106 mmol/L (ref 96–106)
Creatinine, Ser: 1.41 mg/dL — ABNORMAL HIGH (ref 0.57–1.00)
GFR calc Af Amer: 44 mL/min/{1.73_m2} — ABNORMAL LOW (ref >59)
GFR calc non Af Amer: 39 mL/min/{1.73_m2} — ABNORMAL LOW (ref >59)
Glucose: 194 mg/dL — ABNORMAL HIGH (ref 65–99)
Potassium: 5.2 mmol/L (ref 3.5–5.2)
Sodium: 139 mmol/L (ref 134–144)

## 2019-02-09 ENCOUNTER — Telehealth: Payer: Self-pay | Admitting: *Deleted

## 2019-02-09 NOTE — Telephone Encounter (Signed)
-----   Message from Wellington Hampshire, MD sent at 02/08/2019  5:01 PM EST ----- Preangiogram labs showed mildly elevated creatinine.  She needs to increase her fluid intake before the angiogram and we should do 4 hours of IV hydration before the procedure.

## 2019-02-09 NOTE — Telephone Encounter (Signed)
Left a message for the patient to call back. Her procedure has been moved to 10:30 on 11/18 due to the need for 4 hours of hydration. She will still need to arrive at the hospital at 6:30 am the day of the procedure.

## 2019-02-10 ENCOUNTER — Other Ambulatory Visit (HOSPITAL_COMMUNITY)
Admission: RE | Admit: 2019-02-10 | Discharge: 2019-02-10 | Disposition: A | Discharge status: Home / Self Care | Payer: Medicare Other | Source: Ambulatory Visit | Attending: Cardiovascular Disease | Admitting: Cardiovascular Disease

## 2019-02-10 DIAGNOSIS — Z20828 Contact with and (suspected) exposure to other viral communicable diseases: Secondary | ICD-10-CM | POA: Insufficient documentation

## 2019-02-10 DIAGNOSIS — Z01812 Encounter for preprocedural laboratory examination: Secondary | ICD-10-CM | POA: Insufficient documentation

## 2019-02-11 LAB — NOVEL CORONAVIRUS, NAA (HOSP ORDER, SEND-OUT TO REF LAB; TAT 18-24 HRS): SARS-CoV-2, NAA: NOT DETECTED

## 2019-02-12 NOTE — Telephone Encounter (Signed)
The patient has been called and made aware to stay hydrated by drinking more water and to be at the hospital by 6:30 am for hydration prior to the procedure.

## 2019-02-13 ENCOUNTER — Telehealth: Payer: Self-pay | Admitting: *Deleted

## 2019-02-13 NOTE — Telephone Encounter (Addendum)
Pt contacted pre-abdominal aortogram scheduled at [facility] for: Wednesday February 14, 2019 10:30 AM Verified arrival time and place: [facility]) at: 5:30 AM-pre procedure hydration  No solid food after midnight prior to cath, clear liquids until 5 AM day of procedure. Contrast allergy: no  Hold: Lisinopril-day before and day of procedure-GFR 39-pt already taken today. HCTZ-day before and day of procedure-GFR 39-pt had already taken today.  Except hold medications AM meds can be  taken pre-cath with sip of water including: ASA 81 mg Plavix 75mg   Confirmed patient has responsible adult to drive home post procedure and observe 24 hours after arriving home: yes  Currently, due to Covid-19 pandemic, only one support person will be allowed with patient. Must be the same support person for that patient's entire stay, will be screened and required to wear a mask. They will be asked to wait in the waiting room for the duration of the patient's stay.  Patients are required to wear a mask when they enter the hospital.      COVID-19 Pre-Screening Questions:  . In the past 7 to 10 days have you had a cough,  shortness of breath, headache, congestion, fever (100 or greater) body aches, chills, sore throat, or sudden loss of taste or sense of smell? no . Have you been around anyone with known Covid 19? no . Have you been around anyone who is awaiting Covid 19 test results in the past 7 to 10 days? no . Have you been around anyone who has been exposed to Covid 19, or has mentioned symptoms of Covid 19 within the past 7 to 10 days? no  I reviewed procedure/mask/visitor instructions, Covid-19 screening questions with patient, she verbalized understanding.      Pt requested I call her daughter, Brayton Layman, and review instructions with her.   I spoke with patient's daughter,Monica, reviewed instructions with her, she thanked me for call.

## 2019-02-14 ENCOUNTER — Other Ambulatory Visit: Payer: Self-pay

## 2019-02-14 ENCOUNTER — Ambulatory Visit (HOSPITAL_COMMUNITY)
Admission: RE | Admit: 2019-02-14 | Discharge: 2019-02-14 | Disposition: A | Discharge status: Home / Self Care | Payer: BC Managed Care – PPO | Attending: Cardiovascular Disease | Admitting: Cardiovascular Disease

## 2019-02-14 ENCOUNTER — Encounter (HOSPITAL_COMMUNITY)
Admission: RE | Disposition: A | Discharge status: Home / Self Care | Payer: Self-pay | Source: Home / Self Care | Attending: Cardiovascular Disease

## 2019-02-14 DIAGNOSIS — Z87891 Personal history of nicotine dependence: Secondary | ICD-10-CM | POA: Diagnosis not present

## 2019-02-14 DIAGNOSIS — I739 Peripheral vascular disease, unspecified: Secondary | ICD-10-CM

## 2019-02-14 DIAGNOSIS — Z7902 Long term (current) use of antithrombotics/antiplatelets: Secondary | ICD-10-CM | POA: Insufficient documentation

## 2019-02-14 DIAGNOSIS — Z7982 Long term (current) use of aspirin: Secondary | ICD-10-CM | POA: Diagnosis not present

## 2019-02-14 DIAGNOSIS — R0609 Other forms of dyspnea: Secondary | ICD-10-CM | POA: Diagnosis not present

## 2019-02-14 DIAGNOSIS — Z79899 Other long term (current) drug therapy: Secondary | ICD-10-CM | POA: Insufficient documentation

## 2019-02-14 DIAGNOSIS — I2510 Atherosclerotic heart disease of native coronary artery without angina pectoris: Secondary | ICD-10-CM | POA: Diagnosis not present

## 2019-02-14 DIAGNOSIS — E785 Hyperlipidemia, unspecified: Secondary | ICD-10-CM | POA: Insufficient documentation

## 2019-02-14 DIAGNOSIS — I10 Essential (primary) hypertension: Secondary | ICD-10-CM | POA: Insufficient documentation

## 2019-02-14 DIAGNOSIS — I70213 Atherosclerosis of native arteries of extremities with intermittent claudication, bilateral legs: Secondary | ICD-10-CM | POA: Insufficient documentation

## 2019-02-14 DIAGNOSIS — Z955 Presence of coronary angioplasty implant and graft: Secondary | ICD-10-CM | POA: Insufficient documentation

## 2019-02-14 DIAGNOSIS — I701 Atherosclerosis of renal artery: Secondary | ICD-10-CM | POA: Insufficient documentation

## 2019-02-14 HISTORY — PX: ABDOMINAL AORTOGRAM W/LOWER EXTREMITY: CATH118223

## 2019-02-14 SURGERY — ABDOMINAL AORTOGRAM W/LOWER EXTREMITY
Anesthesia: LOCAL

## 2019-02-14 MED ORDER — SODIUM CHLORIDE 0.9% FLUSH: 3.0000 mL | INTRAVENOUS | Status: DC | PRN

## 2019-02-14 MED ORDER — HYDRALAZINE HCL 20 MG/ML IJ SOLN: 5.0000 mg | INTRAMUSCULAR | Status: DC | PRN

## 2019-02-14 MED ORDER — SODIUM CHLORIDE 0.9 % IV SOLN
INTRAVENOUS | Status: AC | PRN
  Administered 2019-02-14: 250 mL via INTRAVENOUS

## 2019-02-14 MED ORDER — FENTANYL CITRATE (PF) 100 MCG/2ML IJ SOLN
INTRAMUSCULAR | Status: AC
  Filled 2019-02-14: qty 2

## 2019-02-14 MED ORDER — HEPARIN (PORCINE) IN NACL 1000-0.9 UT/500ML-% IV SOLN
INTRAVENOUS | Status: AC
  Filled 2019-02-14: qty 1000

## 2019-02-14 MED ORDER — SODIUM CHLORIDE 0.9 % IV SOLN: INTRAVENOUS | Status: DC

## 2019-02-14 MED ORDER — SODIUM CHLORIDE 0.9 % IV SOLN
INTRAVENOUS | Status: DC
  Administered 2019-02-14: 07:00:00 via INTRAVENOUS

## 2019-02-14 MED ORDER — LIDOCAINE HCL (PF) 1 % IJ SOLN
INTRAMUSCULAR | Status: DC | PRN
  Administered 2019-02-14: 20 mL via INTRADERMAL

## 2019-02-14 MED ORDER — MIDAZOLAM HCL 2 MG/2ML IJ SOLN
INTRAMUSCULAR | Status: AC
  Filled 2019-02-14: qty 2

## 2019-02-14 MED ORDER — IODIXANOL 320 MG/ML IV SOLN
INTRAVENOUS | Status: DC | PRN
  Administered 2019-02-14: 100 mL via INTRA_ARTERIAL

## 2019-02-14 MED ORDER — ASPIRIN 81 MG PO CHEW: 81.0000 mg | CHEWABLE_TABLET | ORAL | Status: DC

## 2019-02-14 MED ORDER — SODIUM CHLORIDE 0.9 % IV SOLN: 250.0000 mL | INTRAVENOUS | Status: DC | PRN

## 2019-02-14 MED ORDER — FENTANYL CITRATE (PF) 100 MCG/2ML IJ SOLN
INTRAMUSCULAR | Status: DC | PRN
  Administered 2019-02-14: 50 ug via INTRAVENOUS

## 2019-02-14 MED ORDER — ACETAMINOPHEN 325 MG PO TABS: 650.0000 mg | ORAL_TABLET | ORAL | Status: DC | PRN

## 2019-02-14 MED ORDER — HEPARIN (PORCINE) IN NACL 1000-0.9 UT/500ML-% IV SOLN
INTRAVENOUS | Status: DC | PRN
  Administered 2019-02-14 (×2): 500 mL

## 2019-02-14 MED ORDER — ONDANSETRON HCL 4 MG/2ML IJ SOLN: 4.0000 mg | Freq: Four times a day (QID) | INTRAMUSCULAR | Status: DC | PRN

## 2019-02-14 MED ORDER — SODIUM CHLORIDE 0.9% FLUSH: 3.0000 mL | Freq: Two times a day (BID) | INTRAVENOUS | Status: DC

## 2019-02-14 MED ORDER — MIDAZOLAM HCL 2 MG/2ML IJ SOLN
INTRAMUSCULAR | Status: DC | PRN
  Administered 2019-02-14: 1 mg via INTRAVENOUS

## 2019-02-14 MED ORDER — LIDOCAINE HCL (PF) 1 % IJ SOLN
INTRAMUSCULAR | Status: AC
  Filled 2019-02-14: qty 30

## 2019-02-14 SURGICAL SUPPLY — 12 items
CATH ANGIO 5F PIGTAIL 65CM (CATHETERS) ×2 IMPLANT
CLOSURE MYNX CONTROL 5F (Vascular Products) ×2 IMPLANT
KIT MICROPUNCTURE NIT STIFF (SHEATH) ×2 IMPLANT
KIT PV (KITS) ×2 IMPLANT
SHEATH PINNACLE 5F 10CM (SHEATH) ×2 IMPLANT
SHEATH PROBE COVER 6X72 (BAG) ×2 IMPLANT
STOPCOCK MORSE 400PSI 3WAY (MISCELLANEOUS) ×2 IMPLANT
SYR MEDRAD MARK 7 150ML (SYRINGE) ×2 IMPLANT
TRANSDUCER W/STOPCOCK (MISCELLANEOUS) ×2 IMPLANT
TRAY PV CATH (CUSTOM PROCEDURE TRAY) ×2 IMPLANT
TUBING CIL FLEX 10 FLL-RA (TUBING) ×2 IMPLANT
WIRE BENTSON .035X145CM (WIRE) ×2 IMPLANT

## 2019-02-14 NOTE — Interval H&P Note (Signed)
History and Physical Interval Note:  02/14/2019 10:48 AM  Ashley Moran  has presented today for surgery, with the diagnosis of Peripheral Vascular Disease.  The various methods of treatment have been discussed with the patient and family. After consideration of risks, benefits and other options for treatment, the patient has consented to  Procedure(s): ABDOMINAL AORTOGRAM W/ Bilateral LOWER EXTREMITY (N/A) as a surgical intervention.  The patient's history has been reviewed, patient examined, no change in status, stable for surgery.  I have reviewed the patient's chart and labs.  Questions were answered to the patient's satisfaction.     Kathlyn Sacramento

## 2019-02-14 NOTE — Discharge Instructions (Signed)

## 2019-02-15 ENCOUNTER — Encounter (HOSPITAL_COMMUNITY): Payer: Self-pay | Admitting: Cardiovascular Disease

## 2019-02-15 ENCOUNTER — Ambulatory Visit: Payer: BC Managed Care – PPO | Admitting: Cardiology

## 2019-02-16 ENCOUNTER — Encounter (HOSPITAL_COMMUNITY): Payer: Self-pay | Admitting: Cardiovascular Disease

## 2019-03-13 ENCOUNTER — Encounter: Payer: Self-pay | Admitting: Cardiovascular Disease

## 2019-03-13 ENCOUNTER — Other Ambulatory Visit: Payer: Self-pay

## 2019-03-13 ENCOUNTER — Ambulatory Visit (INDEPENDENT_AMBULATORY_CARE_PROVIDER_SITE_OTHER): Payer: BC Managed Care – PPO | Admitting: Cardiovascular Disease

## 2019-03-13 VITALS — BP 186/90 | HR 71 | Ht 59.0 in | Wt 139.2 lb

## 2019-03-13 DIAGNOSIS — I2510 Atherosclerotic heart disease of native coronary artery without angina pectoris: Secondary | ICD-10-CM | POA: Diagnosis not present

## 2019-03-13 DIAGNOSIS — I739 Peripheral vascular disease, unspecified: Secondary | ICD-10-CM | POA: Diagnosis not present

## 2019-03-13 DIAGNOSIS — E785 Hyperlipidemia, unspecified: Secondary | ICD-10-CM

## 2019-03-13 DIAGNOSIS — I10 Essential (primary) hypertension: Secondary | ICD-10-CM

## 2019-03-13 NOTE — Patient Instructions (Signed)
Medication Instructions:  No changes *If you need a refill on your cardiac medications before your next appointment, please call your pharmacy*  Lab Work: None ordered If you have labs (blood work) drawn today and your tests are completely normal, you will receive your results only by: . MyChart Message (if you have MyChart) OR . A paper copy in the mail If you have any lab test that is abnormal or we need to change your treatment, we will call you to review the results.  Testing/Procedures: None ordered  Follow-Up: At [facility], you and your health needs are our priority.  As part of our continuing mission to provide you with exceptional heart care, we have created designated Provider Care Teams.  These Care Teams include your primary Cardiologist (physician) and Advanced Practice Providers (APPs -  Physician Assistants and Nurse Practitioners) who all work together to provide you with the care you need, when you need it.  Your next appointment:   6 month(s)  The format for your next appointment:   In Person  Provider:   Muhammad Arida, MD    

## 2019-03-13 NOTE — Progress Notes (Signed)
Cardiology Office Note   Date:  03/13/2019   ID:  Ashley HainesLeticia Moran, DOB 01/06/1951, MRN [id number]  PCP:  Patient, No Pcp Per  Cardiologist:   Dr. Herbie BaltimoreHarding  No chief complaint on file.     History of Present Illness: Ashley HainesLeticia Moran is a 68 y.o. female who is here today for follow-up visit regarding peripheral arterial disease.    She has known history of coronary artery disease status post RCA PCI, tobacco use, essential hypertension and hyperlipidemia. She quit smoking in 2019 and she is not diabetic. She was seen recently for bilateral calf claudication worse on the left side. Vascular studies showed an ABI of 0.72 on the right and 0.57 on the left.  Duplex showed moderate iliac disease with occluded proximal to mid left SFA.  On the right, there was moderate SFA disease. I proceeded with angiography last month which showed mild iliac disease bilaterally.  On the right side, there was moderate stenosis in the common femoral artery with mild to moderate diffuse SFA disease and three-vessel runoff below the knee.  On the left side, there was long occlusion of the SFA with reconstitution distally via very well developed collaterals from the profunda and three-vessel runoff below the knee. She is doing well with no significant calf claudication.  She complains of pain in the medial aspect of her thigh on both sides.  Past Medical History:  Diagnosis Date  . Coronary artery disease involving native coronary artery of native heart with angina pectoris ([facility]) 08/2017   Unstable Angina - heavy coronary Ca on CT, Inferior perfusion defect on Myoview -> Cath 90% mRCA (DES PCI), p-mCx 60-75% heavily calcified (Med Rx), mLAD 50%.   . Essential hypertension   . Heavy cigarette smoker (20-39 per day)    Trying to quit as of 08/2017  . Hyperlipidemia with target LDL less than 70     Past Surgical History:  Procedure Laterality Date  . ABDOMINAL AORTOGRAM W/LOWER EXTREMITY N/A 02/14/2019   Procedure: ABDOMINAL AORTOGRAM W/ Bilateral LOWER EXTREMITY;  Surgeon: Iran OuchArida, Usman Millett A, MD;  Location: [facility];  Service: Cardiovascular;  Laterality: N/A;  . CORONARY STENT INTERVENTION N/A 09/14/2017   Procedure: CORONARY STENT INTERVENTION;  Surgeon: Lennette BihariKelly, Thomas A, MD;  Location: [facility];  Service: Cardiovascular:: p-mRCA (covering both lesions): Resolute Onyx DES 2.5 mm x 26 mm (~2.6 mm)  . LEFT HEART CATH AND CORONARY ANGIOGRAPHY N/A 09/14/2017   Procedure: LEFT HEART CATH AND CORONARY ANGIOGRAPHY;  Surgeon: Lennette BihariKelly, Thomas A, MD;  Location: [facility];  Service: Cardiovascular: mRCA 90% & pRCA 50% -> DES PCI.  p-mCx ~80%, m&dCx 70-75% (on review ~60-65% with focal ~70% eccentric lesion - calcified), pLAD ~50%  . NM MYOVIEW LTD  09/13/2017   EF 73%.  Inferior-inferolateral perfusion defect consistent with ischemia.  . TRANSTHORACIC ECHOCARDIOGRAM  09/13/2017    Mild LVH.  EF 60 to 65%.  No RWMA.  GR 1 DD.     Current Outpatient Medications  Medication Sig Dispense Refill  . Aspirin-Caffeine (BACK & BODY EXTRA STRENGTH PO) Take 1 tablet by mouth 4 (four) times daily as needed (pain).     [name]. atorvastatin (LIPITOR) 40 MG tablet TAKE 1 TABLET (40 MG TOTAL) BY MOUTH DAILY AT 6 PM. 90 tablet 3  . carvedilol (COREG) 25 MG tablet TAKE 1 TABLET BY MOUTH TWICE A DAY 180 tablet 3  . clopidogrel (PLAVIX) 75 MG tablet TAKE 1 TABLET BY MOUTH EVERY  DAY 90 tablet 3  . hydrochlorothiazide (HYDRODIURIL) 25 MG tablet Take 1 tablet (25 mg total) by mouth daily. 90 tablet 3  . lisinopril (ZESTRIL) 20 MG tablet TAKE 1 TABLET BY MOUTH EVERY DAY 90 tablet 3  . Omega-3 Fatty Acids (FISH OIL) 1000 MG CAPS Take 1,000 mg by mouth daily.     No current facility-administered medications for this visit.    Allergies:   Patient has no known allergies.    Social History:  The patient  reports that she quit smoking about 17 months ago. She smoked 0.50 packs per day. She has never used  smokeless tobacco. She reports that she does not drink alcohol or use drugs.   Family History:  The patient's family history includes Other in her mother.    ROS:  Please see the history of present illness.   Otherwise, review of systems are positive for none.   All other systems are reviewed and negative.    PHYSICAL EXAM: VS:  BP (!) 186/90   Pulse 71   Ht 4\' 11"  (1.499 m)   Wt 139 lb 3.2 oz (63.1 kg)   BMI 28.11 kg/m  , BMI Body mass index is 28.11 kg/m. GEN: Well nourished, well developed, in no acute distress  HEENT: normal  Neck: no JVD, carotid bruits, or masses Cardiac: RRR; no  rubs, or gallops,no edema .  2 out of 6 systolic murmur at the base Respiratory:  clear to auscultation bilaterally, normal work of breathing GI: soft, nontender, nondistended, + BS MS: no deformity or atrophy  Skin: warm and dry, no rash Neuro:  Strength and sensation are intact Psych: euthymic mood, full affect Vascular: Femoral pulses +1 bilaterally.  Dorsalis pedis is +2 on the right and +1 on the left.   No groin hematoma.  EKG:  EKG is ordered today. EKG showed normal sinus rhythm with right bundle branch block.   Recent Labs: 02/06/2019: BUN 27; Creatinine, Ser 1.41; Hemoglobin 11.5; Platelets 399; Potassium 5.2; Sodium 139    Lipid Panel    Component Value Date/Time   CHOL 147 12/15/2018 1002   TRIG 340 (H) 12/15/2018 1002   HDL 31 (L) 12/15/2018 1002   CHOLHDL 4.7 (H) 12/15/2018 1002   CHOLHDL 4.5 09/13/2017 0826   VLDL 24 09/13/2017 0826   LDLCALC 63 12/15/2018 1002      Wt Readings from Last 3 Encounters:  03/13/19 139 lb 3.2 oz (63.1 kg)  02/14/19 137 lb (62.1 kg)  02/06/19 137 lb (62.1 kg)        No flowsheet data found.    ASSESSMENT AND PLAN:  1.  Peripheral arterial disease: She reports that since her procedure she has been walking without calf claudication although no revascularization was performed.  The patient has long occlusion of the left SFA with  well-developed collaterals.  I recommend continuing medical therapy.    2.  Coronary artery disease involving native coronary arteries without angina: She is doing well with no anginal symptoms.  Continue medical therapy.  3.  Tobacco use: She quit smoking in 2019.  4.  Essential hypertension: She likely has some component of whitecoat syndrome.  Her blood pressure is always elevated at the physician's office.  5.  Hyperlipidemia: Continue treatment with atorvastatin.  Lipid profile in September showed an LDL of 63.  Triglyceride was 340 and thus should consider Vascepa.    Disposition:   FU with me in 6 months  Signed,  Kathlyn Sacramento, MD  03/13/2019 2:17 PM    [facility]

## 2019-03-24 DIAGNOSIS — R6883 Chills (without fever): Secondary | ICD-10-CM | POA: Diagnosis not present

## 2019-03-24 DIAGNOSIS — M791 Myalgia, unspecified site: Secondary | ICD-10-CM | POA: Diagnosis not present

## 2019-03-24 DIAGNOSIS — Z20828 Contact with and (suspected) exposure to other viral communicable diseases: Secondary | ICD-10-CM | POA: Diagnosis not present

## 2019-03-28 ENCOUNTER — Emergency Department (HOSPITAL_COMMUNITY): Payer: BC Managed Care – PPO

## 2019-03-28 ENCOUNTER — Inpatient Hospital Stay (HOSPITAL_COMMUNITY)
Admission: EM | Admit: 2019-03-28 | Discharge: 2019-04-30 | DRG: 870 | Disposition: E | Payer: BC Managed Care – PPO | Attending: Pulmonary Disease | Admitting: Pulmonary Disease

## 2019-03-28 ENCOUNTER — Inpatient Hospital Stay (HOSPITAL_COMMUNITY): Payer: BC Managed Care – PPO

## 2019-03-28 ENCOUNTER — Other Ambulatory Visit: Payer: Self-pay

## 2019-03-28 DIAGNOSIS — J9600 Acute respiratory failure, unspecified whether with hypoxia or hypercapnia: Secondary | ICD-10-CM

## 2019-03-28 DIAGNOSIS — E874 Mixed disorder of acid-base balance: Secondary | ICD-10-CM | POA: Diagnosis present

## 2019-03-28 DIAGNOSIS — J159 Unspecified bacterial pneumonia: Secondary | ICD-10-CM | POA: Diagnosis not present

## 2019-03-28 DIAGNOSIS — R319 Hematuria, unspecified: Secondary | ICD-10-CM | POA: Diagnosis not present

## 2019-03-28 DIAGNOSIS — I462 Cardiac arrest due to underlying cardiac condition: Secondary | ICD-10-CM | POA: Diagnosis not present

## 2019-03-28 DIAGNOSIS — J9602 Acute respiratory failure with hypercapnia: Secondary | ICD-10-CM | POA: Diagnosis present

## 2019-03-28 DIAGNOSIS — E860 Dehydration: Secondary | ICD-10-CM | POA: Diagnosis present

## 2019-03-28 DIAGNOSIS — Z87891 Personal history of nicotine dependence: Secondary | ICD-10-CM

## 2019-03-28 DIAGNOSIS — E875 Hyperkalemia: Secondary | ICD-10-CM | POA: Diagnosis present

## 2019-03-28 DIAGNOSIS — A411 Sepsis due to other specified staphylococcus: Secondary | ICD-10-CM | POA: Diagnosis not present

## 2019-03-28 DIAGNOSIS — R109 Unspecified abdominal pain: Secondary | ICD-10-CM

## 2019-03-28 DIAGNOSIS — R34 Anuria and oliguria: Secondary | ICD-10-CM | POA: Diagnosis not present

## 2019-03-28 DIAGNOSIS — R6521 Severe sepsis with septic shock: Secondary | ICD-10-CM | POA: Diagnosis not present

## 2019-03-28 DIAGNOSIS — Z9911 Dependence on respirator [ventilator] status: Secondary | ICD-10-CM | POA: Diagnosis not present

## 2019-03-28 DIAGNOSIS — T380X5A Adverse effect of glucocorticoids and synthetic analogues, initial encounter: Secondary | ICD-10-CM | POA: Diagnosis not present

## 2019-03-28 DIAGNOSIS — E785 Hyperlipidemia, unspecified: Secondary | ICD-10-CM | POA: Diagnosis present

## 2019-03-28 DIAGNOSIS — G9341 Metabolic encephalopathy: Secondary | ICD-10-CM | POA: Diagnosis not present

## 2019-03-28 DIAGNOSIS — R0603 Acute respiratory distress: Secondary | ICD-10-CM | POA: Diagnosis not present

## 2019-03-28 DIAGNOSIS — Z978 Presence of other specified devices: Secondary | ICD-10-CM

## 2019-03-28 DIAGNOSIS — Y95 Nosocomial condition: Secondary | ICD-10-CM | POA: Diagnosis present

## 2019-03-28 DIAGNOSIS — N179 Acute kidney failure, unspecified: Secondary | ICD-10-CM | POA: Diagnosis not present

## 2019-03-28 DIAGNOSIS — D6489 Other specified anemias: Secondary | ICD-10-CM | POA: Diagnosis not present

## 2019-03-28 DIAGNOSIS — R739 Hyperglycemia, unspecified: Secondary | ICD-10-CM | POA: Diagnosis not present

## 2019-03-28 DIAGNOSIS — E871 Hypo-osmolality and hyponatremia: Secondary | ICD-10-CM | POA: Diagnosis present

## 2019-03-28 DIAGNOSIS — Z452 Encounter for adjustment and management of vascular access device: Secondary | ICD-10-CM

## 2019-03-28 DIAGNOSIS — J1282 Pneumonia due to coronavirus disease 2019: Secondary | ICD-10-CM | POA: Diagnosis present

## 2019-03-28 DIAGNOSIS — Z7902 Long term (current) use of antithrombotics/antiplatelets: Secondary | ICD-10-CM

## 2019-03-28 DIAGNOSIS — R680 Hypothermia, not associated with low environmental temperature: Secondary | ICD-10-CM | POA: Diagnosis not present

## 2019-03-28 DIAGNOSIS — I452 Bifascicular block: Secondary | ICD-10-CM | POA: Diagnosis not present

## 2019-03-28 DIAGNOSIS — J9690 Respiratory failure, unspecified, unspecified whether with hypoxia or hypercapnia: Secondary | ICD-10-CM

## 2019-03-28 DIAGNOSIS — J80 Acute respiratory distress syndrome: Secondary | ICD-10-CM | POA: Diagnosis not present

## 2019-03-28 DIAGNOSIS — I10 Essential (primary) hypertension: Secondary | ICD-10-CM | POA: Diagnosis not present

## 2019-03-28 DIAGNOSIS — Z79899 Other long term (current) drug therapy: Secondary | ICD-10-CM

## 2019-03-28 DIAGNOSIS — R0602 Shortness of breath: Secondary | ICD-10-CM | POA: Diagnosis not present

## 2019-03-28 DIAGNOSIS — J9601 Acute respiratory failure with hypoxia: Secondary | ICD-10-CM | POA: Diagnosis not present

## 2019-03-28 DIAGNOSIS — I70213 Atherosclerosis of native arteries of extremities with intermittent claudication, bilateral legs: Secondary | ICD-10-CM | POA: Diagnosis present

## 2019-03-28 DIAGNOSIS — I2510 Atherosclerotic heart disease of native coronary artery without angina pectoris: Secondary | ICD-10-CM | POA: Diagnosis not present

## 2019-03-28 DIAGNOSIS — I4510 Unspecified right bundle-branch block: Secondary | ICD-10-CM | POA: Diagnosis not present

## 2019-03-28 DIAGNOSIS — Z9289 Personal history of other medical treatment: Secondary | ICD-10-CM

## 2019-03-28 DIAGNOSIS — Z955 Presence of coronary angioplasty implant and graft: Secondary | ICD-10-CM

## 2019-03-28 DIAGNOSIS — J1289 Other viral pneumonia: Secondary | ICD-10-CM

## 2019-03-28 DIAGNOSIS — R0902 Hypoxemia: Secondary | ICD-10-CM

## 2019-03-28 DIAGNOSIS — U071 COVID-19: Secondary | ICD-10-CM | POA: Diagnosis not present

## 2019-03-28 LAB — BASIC METABOLIC PANEL
Anion gap: 12 (ref 5–15)
Anion gap: 11 (ref 5–15)
BUN: 55 mg/dL — ABNORMAL HIGH (ref 8–23)
BUN: 52 mg/dL — ABNORMAL HIGH (ref 8–23)
CO2: 17 mmol/L — ABNORMAL LOW (ref 22–32)
CO2: 16 mmol/L — ABNORMAL LOW (ref 22–32)
Calcium: 8.6 mg/dL — ABNORMAL LOW (ref 8.9–10.3)
Calcium: 7.7 mg/dL — ABNORMAL LOW (ref 8.9–10.3)
Chloride: 100 mmol/L (ref 98–111)
Chloride: 108 mmol/L (ref 98–111)
Creatinine, Ser: 1.8 mg/dL — ABNORMAL HIGH (ref 0.44–1.00)
Creatinine, Ser: 1.47 mg/dL — ABNORMAL HIGH (ref 0.44–1.00)
GFR calc Af Amer: 33 mL/min — ABNORMAL LOW (ref >60)
GFR calc Af Amer: 42 mL/min — ABNORMAL LOW (ref >60)
GFR calc non Af Amer: 29 mL/min — ABNORMAL LOW (ref >60)
GFR calc non Af Amer: 37 mL/min — ABNORMAL LOW (ref >60)
Glucose, Bld: 340 mg/dL — ABNORMAL HIGH (ref 70–99)
Glucose, Bld: 306 mg/dL — ABNORMAL HIGH (ref 70–99)
Potassium: 5.7 mmol/L — ABNORMAL HIGH (ref 3.5–5.1)
Potassium: 5.7 mmol/L — ABNORMAL HIGH (ref 3.5–5.1)
Sodium: 129 mmol/L — ABNORMAL LOW (ref 135–145)
Sodium: 135 mmol/L (ref 135–145)

## 2019-03-28 LAB — POCT I-STAT 7, (LYTES, BLD GAS, ICA,H+H)
Acid-base deficit: 8 mmol/L — ABNORMAL HIGH (ref 0.0–2.0)
Bicarbonate: 15.8 mmol/L — ABNORMAL LOW (ref 20.0–28.0)
Calcium, Ion: 1.14 mmol/L — ABNORMAL LOW (ref 1.15–1.40)
HCT: 37 % (ref 36.0–46.0)
Hemoglobin: 12.6 g/dL (ref 12.0–15.0)
O2 Saturation: 86 %
Patient temperature: 97.5
Potassium: 4.9 mmol/L (ref 3.5–5.1)
Sodium: 128 mmol/L — ABNORMAL LOW (ref 135–145)
TCO2: 17 mmol/L — ABNORMAL LOW (ref 22–32)
pCO2 arterial: 27.8 mmHg — ABNORMAL LOW (ref 32.0–48.0)
pH, Arterial: 7.36 (ref 7.350–7.450)
pO2, Arterial: 51 mmHg — ABNORMAL LOW (ref 83.0–108.0)

## 2019-03-28 LAB — HEPATIC FUNCTION PANEL
ALT: 146 U/L — ABNORMAL HIGH (ref 0–44)
AST: 160 U/L — ABNORMAL HIGH (ref 15–41)
Albumin: 3 g/dL — ABNORMAL LOW (ref 3.5–5.0)
Alkaline Phosphatase: 63 U/L (ref 38–126)
Bilirubin, Direct: 0.3 mg/dL — ABNORMAL HIGH (ref 0.0–0.2)
Indirect Bilirubin: 0.7 mg/dL (ref 0.3–0.9)
Total Bilirubin: 1 mg/dL (ref 0.3–1.2)
Total Protein: 7.4 g/dL (ref 6.5–8.1)

## 2019-03-28 LAB — LACTIC ACID, PLASMA
Lactic Acid, Venous: 2.9 mmol/L (ref 0.5–1.9)
Lactic Acid, Venous: 2 mmol/L (ref 0.5–1.9)
Lactic Acid, Venous: 2.2 mmol/L (ref 0.5–1.9)
Lactic Acid, Venous: 1.8 mmol/L (ref 0.5–1.9)

## 2019-03-28 LAB — URINALYSIS, ROUTINE W REFLEX MICROSCOPIC
Bacteria, UA: NONE SEEN
Bilirubin Urine: NEGATIVE
Glucose, UA: >=500 mg/dL — AB
Hgb urine dipstick: NEGATIVE
Ketones, ur: NEGATIVE mg/dL
Nitrite: NEGATIVE
Protein, ur: NEGATIVE mg/dL
Specific Gravity, Urine: 1.021 (ref 1.005–1.030)
pH: 5 (ref 5.0–8.0)

## 2019-03-28 LAB — LACTATE DEHYDROGENASE: LDH: 665 U/L — ABNORMAL HIGH (ref 98–192)

## 2019-03-28 LAB — BRAIN NATRIURETIC PEPTIDE: B Natriuretic Peptide: 93.5 pg/mL (ref 0.0–100.0)

## 2019-03-28 LAB — HEMOGLOBIN AND HEMATOCRIT, BLOOD
HCT: 36.2 % (ref 36.0–46.0)
Hemoglobin: 11.1 g/dL — ABNORMAL LOW (ref 12.0–15.0)

## 2019-03-28 LAB — CBC
HCT: 37.6 % (ref 36.0–46.0)
Hemoglobin: 11.7 g/dL — ABNORMAL LOW (ref 12.0–15.0)
MCH: 22.1 pg — ABNORMAL LOW (ref 26.0–34.0)
MCHC: 31.1 g/dL (ref 30.0–36.0)
MCV: 70.9 fL — ABNORMAL LOW (ref 80.0–100.0)
Platelets: 481 10*3/uL — ABNORMAL HIGH (ref 150–400)
RBC: 5.3 MIL/uL — ABNORMAL HIGH (ref 3.87–5.11)
RDW: 14.1 % (ref 11.5–15.5)
WBC: 9.5 10*3/uL (ref 4.0–10.5)
nRBC: 0 % (ref 0.0–0.2)

## 2019-03-28 LAB — LIPASE, BLOOD: Lipase: 44 U/L (ref 11–51)

## 2019-03-28 LAB — POC SARS CORONAVIRUS 2 AG -  ED: SARS Coronavirus 2 Ag: POSITIVE — AB

## 2019-03-28 LAB — C-REACTIVE PROTEIN: CRP: 13.8 mg/dL — ABNORMAL HIGH (ref <1.0)

## 2019-03-28 LAB — D-DIMER, QUANTITATIVE: D-Dimer, Quant: 1.3 ug/mL-FEU — ABNORMAL HIGH (ref 0.00–0.50)

## 2019-03-28 LAB — HIV ANTIBODY (ROUTINE TESTING W REFLEX): HIV Screen 4th Generation wRfx: NONREACTIVE

## 2019-03-28 LAB — TROPONIN I (HIGH SENSITIVITY)
Troponin I (High Sensitivity): 7 ng/L (ref <18)
Troponin I (High Sensitivity): 8 ng/L (ref <18)

## 2019-03-28 LAB — TRIGLYCERIDES: Triglycerides: 113 mg/dL (ref <150)

## 2019-03-28 LAB — PROCALCITONIN: Procalcitonin: >150 ng/mL

## 2019-03-28 LAB — POC OCCULT BLOOD, ED: Fecal Occult Bld: POSITIVE — AB

## 2019-03-28 LAB — FERRITIN: Ferritin: 596 ng/mL — ABNORMAL HIGH (ref 11–307)

## 2019-03-28 MED ORDER — LIDOCAINE VISCOUS HCL 2 % MT SOLN
15.0000 mL | Freq: Once | OROMUCOSAL | Status: AC
  Administered 2019-03-28: 23:00:00 15 mL via ORAL
  Filled 2019-03-28: qty 15

## 2019-03-28 MED ORDER — HEPARIN SODIUM (PORCINE) 5000 UNIT/ML IJ SOLN
5000.0000 [IU] | Freq: Three times a day (TID) | INTRAMUSCULAR | Status: DC
  Administered 2019-03-28 – 2019-04-10 (×39): 5000 [IU] via SUBCUTANEOUS
  Filled 2019-03-28 (×38): qty 1

## 2019-03-28 MED ORDER — TECHNETIUM TO 99M ALBUMIN AGGREGATED: 1.6000 | Freq: Once | INTRAVENOUS | Status: DC | PRN

## 2019-03-28 MED ORDER — LISINOPRIL 20 MG PO TABS: 20.0000 mg | ORAL_TABLET | Freq: Every day | ORAL | Status: DC

## 2019-03-28 MED ORDER — ALUM & MAG HYDROXIDE-SIMETH 200-200-20 MG/5ML PO SUSP
30.0000 mL | Freq: Once | ORAL | Status: AC
  Administered 2019-03-28: 23:00:00 30 mL via ORAL
  Filled 2019-03-28: qty 30

## 2019-03-28 MED ORDER — ALBUTEROL SULFATE HFA 108 (90 BASE) MCG/ACT IN AERS
1.0000 | INHALATION_SPRAY | RESPIRATORY_TRACT | Status: DC | PRN
  Filled 2019-03-28: qty 6.7

## 2019-03-28 MED ORDER — DEXAMETHASONE SODIUM PHOSPHATE 10 MG/ML IJ SOLN
10.0000 mg | Freq: Once | INTRAMUSCULAR | Status: AC
  Administered 2019-03-28: 12:00:00 10 mg via INTRAVENOUS
  Filled 2019-03-28: qty 1

## 2019-03-28 MED ORDER — CARVEDILOL 12.5 MG PO TABS
25.0000 mg | ORAL_TABLET | Freq: Two times a day (BID) | ORAL | Status: DC
  Administered 2019-03-28: 15:00:00 25 mg via ORAL
  Filled 2019-03-28: qty 2

## 2019-03-28 MED ORDER — DEXTROSE 5 % IV SOLN
250.0000 mg | INTRAVENOUS | Status: DC
  Administered 2019-03-29 – 2019-03-30 (×2): 250 mg via INTRAVENOUS
  Filled 2019-03-28 (×3): qty 250

## 2019-03-28 MED ORDER — SODIUM CHLORIDE 0.9 % IV SOLN: INTRAVENOUS | Status: DC

## 2019-03-28 MED ORDER — SODIUM CHLORIDE 0.9 % IV SOLN
100.0000 mg | Freq: Every day | INTRAVENOUS | Status: DC
  Administered 2019-03-29: 100 mg via INTRAVENOUS
  Filled 2019-03-28 (×2): qty 20

## 2019-03-28 MED ORDER — PANTOPRAZOLE SODIUM 40 MG PO TBEC
40.0000 mg | DELAYED_RELEASE_TABLET | Freq: Every day | ORAL | Status: DC
  Administered 2019-03-29 – 2019-03-30 (×2): 40 mg via ORAL
  Filled 2019-03-28 (×2): qty 1

## 2019-03-28 MED ORDER — SODIUM CHLORIDE 0.9 % IV BOLUS
1000.0000 mL | Freq: Once | INTRAVENOUS | Status: AC
  Administered 2019-03-28: 1000 mL via INTRAVENOUS

## 2019-03-28 MED ORDER — SODIUM CHLORIDE 0.9 % IV BOLUS
1000.0000 mL | Freq: Once | INTRAVENOUS | Status: AC
  Administered 2019-03-28: 12:00:00 1000 mL via INTRAVENOUS

## 2019-03-28 MED ORDER — ATORVASTATIN CALCIUM 40 MG PO TABS
40.0000 mg | ORAL_TABLET | Freq: Every day | ORAL | Status: DC
  Administered 2019-03-28: 40 mg via ORAL
  Filled 2019-03-28: qty 1

## 2019-03-28 MED ORDER — SODIUM CHLORIDE 0.9 % IV SOLN
200.0000 mg | Freq: Once | INTRAVENOUS | Status: AC
  Administered 2019-03-28: 15:00:00 200 mg via INTRAVENOUS
  Filled 2019-03-28: qty 40

## 2019-03-28 MED ORDER — TECHNETIUM TO 99M ALBUMIN AGGREGATED
1.6000 | Freq: Once | INTRAVENOUS | Status: AC | PRN
  Administered 2019-03-28: 18:00:00 1.6 via INTRAVENOUS

## 2019-03-28 MED ORDER — POLYETHYLENE GLYCOL 3350 17 G PO PACK: 17.0000 g | PACK | Freq: Every day | ORAL | Status: DC | PRN

## 2019-03-28 MED ORDER — FENTANYL CITRATE (PF) 100 MCG/2ML IJ SOLN
12.5000 ug | Freq: Once | INTRAMUSCULAR | Status: AC
  Administered 2019-03-28: 13:00:00 12.5 ug via INTRAVENOUS
  Filled 2019-03-28: qty 2

## 2019-03-28 MED ORDER — CLOPIDOGREL BISULFATE 75 MG PO TABS
75.0000 mg | ORAL_TABLET | Freq: Every day | ORAL | Status: DC
  Administered 2019-03-28 – 2019-03-30 (×3): 75 mg via ORAL
  Filled 2019-03-28 (×3): qty 1

## 2019-03-28 MED ORDER — SODIUM CHLORIDE 0.9 % IV SOLN
500.0000 mg | Freq: Once | INTRAVENOUS | Status: AC
  Administered 2019-03-28: 500 mg via INTRAVENOUS
  Filled 2019-03-28: qty 500

## 2019-03-28 MED ORDER — SODIUM CHLORIDE 0.9 % IV SOLN
1.0000 g | INTRAVENOUS | Status: DC
  Administered 2019-03-28 – 2019-03-30 (×3): 1 g via INTRAVENOUS
  Filled 2019-03-28 (×3): qty 10

## 2019-03-28 MED ORDER — DEXAMETHASONE 4 MG PO TABS
6.0000 mg | ORAL_TABLET | ORAL | Status: DC
  Administered 2019-03-28 – 2019-03-30 (×3): 6 mg via ORAL
  Filled 2019-03-28 (×3): qty 2

## 2019-03-28 MED ORDER — ACETAMINOPHEN 500 MG PO TABS
500.0000 mg | ORAL_TABLET | Freq: Three times a day (TID) | ORAL | Status: DC | PRN
  Administered 2019-03-29 – 2019-03-31 (×2): 500 mg via ORAL
  Filled 2019-03-28 (×2): qty 1

## 2019-03-28 MED ORDER — SODIUM CHLORIDE 0.9% FLUSH
3.0000 mL | Freq: Two times a day (BID) | INTRAVENOUS | Status: DC
  Administered 2019-03-28 – 2019-04-09 (×23): 3 mL via INTRAVENOUS

## 2019-03-28 MED ORDER — HYDROCHLOROTHIAZIDE 25 MG PO TABS: 25.0000 mg | ORAL_TABLET | Freq: Every day | ORAL | Status: DC

## 2019-03-28 NOTE — Plan of Care (Signed)
Critical Care Plan of Care  Critical Care team contacted regarding patient eligibility for ICU.  On chart review, [name] is a 68 year old female former smoker with CAD s/p PCI, HTN and HLD who presents with nausea, abdominal pain, shortness of breath and recent COVID-19 exposure. In the ED she was found to be hypoxemic but otherwise afebrile and hemodynamically stable.   Patient currently requires 6L O2 via [place] with recent SpO2 96-99%.  EMR labs and tests reviewed. SARS COVID-19 positive. Chest CT with bilateral patchy opacities. ABG 7.36/27/51. Labs reviewed and significant for elevated CRP 13.6, Ferritin 596. Procal elevated >150. Metabolic panel also significant for mild metabolic acidosis with renal failure BUN/Cr 55/1.8.   Assessment/Plan  Acute hypoxemic respiratory failure secondary to COVID-19 No indication for ICU transfer based on current oxygen requirement.  Monitor closely for signs of ventilatory failure such as nasal flaring, accessory muscle use, abdominal paradoxical movements. Maintain O2 saturations for goal SpO2 >85% Self-proning as frequently as possible Decadron 6 mg IV/PO daily  x 10 days Remdesivir protocol x 5 days Pulmonary hygiene including bronchodilator and flutter valve. AVOID manual percussion. Mobilize as tolerated  Sepsis secondary to COVID-19 - elevated procalcitonin Obtain blood cultures and LA Agree with antibiotics for CAP for co-comitant bacterial pneumonia Immunosuppressant therapy contraindicated in setting of suspected infection. Recommend avoiding tocilizumab in this patient  Pulmonology available as needed. Please contact consult pager per Ashley Moran, M.D. [facility] Pulmonary/Critical Care Medicine 03/25/2019 4:22 PM

## 2019-03-28 NOTE — ED Notes (Addendum)
This RN was contacted by a physician that relayed that ICU requested that pt be proned since her need for O2 has increased since her arrival to ED. Pt was initially put on 6L via n/c upon arrival d/t her sats dropping in the 80's while in triage. Once she was brought to her room in the back she was cointinued on the 6L & she fluctuated from high 80's to high 90's. When she was decreased to 3L & proned she desated into the 80's again. Pt is now prone with 6L via n/c on at this time & sat is 100% & 23 rpm.

## 2019-03-28 NOTE — ED Notes (Signed)
Admitting MD paged and updated on pt's condition

## 2019-03-28 NOTE — ED Triage Notes (Signed)
Pt reports abd pain for 3 days. Endorses severe leg pain. O2 sats 80% on RA. 90% on 6L. Endorses SOB

## 2019-03-28 NOTE — ED Notes (Signed)
I spoke with pt's daughter Clovis Fredrickson [phone]) and updated her on pt's condition.  Pt's daughter wishes to be called at anytime of day or night from CCM.

## 2019-03-28 NOTE — ED Notes (Signed)
Pt 02 changed to NRB at 15 LPM

## 2019-03-28 NOTE — Consult Note (Signed)
NAME:  Ashley Moran, MRN:  [id number], DOB:  Oct 05, 1950, LOS: 0 ADMISSION DATE:  03/10/2019, CONSULTATION DATE:  12/30 REFERRING MD:  Dr. Erin Hearing FPTS, CHIEF COMPLAINT:  Hypoxia COVID 74  Brief History   68 year old female ad mitted 12/30 with COVID pneumonia. She became progressively hypoxemic throughout the evening hours prompting PCCM consultation.   History of present illness   68 year old female with PMH as below, which is significant for CAD, PAD, HTN, HLD, and former smoker. She presented to [facility] ED 12/30 with complaints of dyspnea after having a known sick contact to someone with COVID-19 at a birthday party on 12/15. Also c/o abdominal pain and bilateral leg claudication of which she has a history. She required 6L [place] to maintain O2 sats or 90%. POC COVID antigen was positive. She was admitted to the [facility] for management of COVID pneumonia. In the evening hours of 12/30 she became progressively hypoxemic with escalating FiO2 demands up to 15L. PCCM was consulted.   Past Medical History   has a past medical history of Coronary artery disease involving native coronary artery of native heart with angina pectoris ([place]) (08/2017), Essential hypertension, Heavy cigarette smoker (20-39 per day), and Hyperlipidemia with target LDL less than 70.   Significant Hospital Events     Consults:  PCCM  Procedures:    Significant Diagnostic Tests:  CT chest 12/30> Multifocal pneumonia. CT renal stone study 12/30 > No acute intra-abdominopelvic pathology. Specifically No hydronephrosis or nephrolithiasis. Fatty liver. No bowel obstruction or active inflammation. Normal appendix V/Q 12/30 > no convincing evidence for PE  Micro Data:  POC SARS COV 2 antigen 12/30 > positive BC 12/30 >   Antimicrobials:  Ceftriaxone 12/30 > Azithromycin 12/30 >   Decadron 12/30 > Remdesivir 12/30 >  Interim history/subjective:    Objective   Blood pressure (!) 130/59, pulse 65, temperature  (!) 97.5 F (36.4 C), temperature source Oral, resp. rate (!) 31, SpO2 95 %.        Intake/Output Summary (Last 24 hours) at 03/15/2019 2305 Last data filed at 03/09/2019 1854 Gross per 24 hour  Intake 1500 ml  Output --  Net 1500 ml   There were no vitals filed for this visit.  Examination: General: Elderly appearing female in mild distress HENT: [place]/AT, PERRL, no JVD Lungs: coarse bilateral breath sounds Cardiovascular: RRR, no MRG Abdomen: Soft, mild diffuse tenderness, non-distended Extremities: No acute deformity or ROM limitation.  Neuro: Alert, oriented, non-focal.   Resolved Hospital Problem list     Assessment & Plan:   Acute hypoxemic respiratory failure: secondary to COVID-19 pneumonia +/- CAP - Admit to ICU - Decadron x 10 days - Remdesivir x 5 days - CAP coverage as above. - Follow ABG  - Trend d. Dimer, CRP, LDH, ferritin  Hypotension: - Admit to ICU, may need pressors.  - Goal of euvolemia to net negative I&O balance, however she does appear dry on exam, may benefit from some IV hydration up front.   AKI Hyperkalemia - Bicarb amp, calcium gluc 1G - hydrate and repeat BMP  CAD PAD - holding home carvedilol, HCTZ, and lisinopril as she has been transiently hypotensive.   FPTS planning to keep on their list and are willing to assume care 12/31 assuming she remains stable and does not require pressors.   Best practice:  Diet: NPO Pain/Anxiety/Delirium protocol (if indicated): NA VAP protocol (if indicated): NA DVT prophylaxis: heparin SQ GI prophylaxis: NA Glucose control: SSI  Mobility: BR Code Status: FULL Family Communication: patient updated Disposition: ICU  Labs   CBC: Recent Labs  Lab 03/21/2019 0851 03/27/2019 1014 02/27/2019 2220  WBC 9.5  --   --   HGB 11.7* 12.6 11.1*  HCT 37.6 37.0 36.2  MCV 70.9*  --   --   PLT 481*  --   --     Basic Metabolic Panel: Recent Labs  Lab 03/29/2019 0851 03/07/2019 1014  NA 129* 128*  K 5.7*  4.9  CL 100  --   CO2 17*  --   GLUCOSE 340*  --   BUN 55*  --   CREATININE 1.80*  --   CALCIUM 8.6*  --    GFR: CrCl cannot be calculated (Unknown ideal weight.). Recent Labs  Lab 03/27/2019 0851 03/08/2019 1000 03/01/2019 1257 03/16/2019 1644 03/14/2019 2202  PROCALCITON  --  >150.00  --   --   --   WBC 9.5  --   --   --   --   LATICACIDVEN  --  2.9* 2.0* 2.2* 1.8    Liver Function Tests: Recent Labs  Lab 02/27/2019 1000  AST 160*  ALT 146*  ALKPHOS 63  BILITOT 1.0  PROT 7.4  ALBUMIN 3.0*   Recent Labs  Lab 03/13/2019 1000  LIPASE 44   No results for input(s): AMMONIA in the last 168 hours.  ABG    Component Value Date/Time   PHART 7.360 03/16/2019 1014   PCO2ART 27.8 (L) 03/11/2019 1014   PO2ART 51.0 (L) 03/25/2019 1014   HCO3 15.8 (L) 03/12/2019 1014   TCO2 17 (L) 03/12/2019 1014   ACIDBASEDEF 8.0 (H) 03/11/2019 1014   O2SAT 86.0 03/18/2019 1014     Coagulation Profile: No results for input(s): INR, PROTIME in the last 168 hours.  Cardiac Enzymes: No results for input(s): CKTOTAL, CKMB, CKMBINDEX, TROPONINI in the last 168 hours.  HbA1C: No results found for: HGBA1C  CBG: No results for input(s): GLUCAP in the last 168 hours.  Review of Systems:   Bolds are positive  Constitutional: weight loss, gain, night sweats, Fevers, chills, fatigue .  HEENT: headaches, Sore throat, sneezing, nasal congestion, post nasal drip, Difficulty swallowing, Tooth/dental problems, visual complaints visual changes, ear ache CV:  chest pain, radiates:,Orthopnea, PND, swelling in lower extremities, dizziness, palpitations, syncope.  GI  heartburn, indigestion, abdominal pain, nausea, vomiting, diarrhea, change in bowel habits, loss of appetite, bloody stools.  Resp: cough, productive:, hemoptysis, dyspnea, chest pain, pleuritic.  Skin: rash or itching or icterus GU: dysuria, change in color of urine, urgency or frequency. flank pain, hematuria  MS: joint pain or swelling.  decreased range of motion  Psych: change in mood or affect. depression or anxiety.  Neuro: difficulty with speech, weakness, numbness, ataxia    Past Medical History  She,  has a past medical history of Coronary artery disease involving native coronary artery of native heart with angina pectoris ([facility]) (08/2017), Essential hypertension, Heavy cigarette smoker (20-39 per day), and Hyperlipidemia with target LDL less than 70.   Surgical History    Past Surgical History:  Procedure Laterality Date  . ABDOMINAL AORTOGRAM W/LOWER EXTREMITY N/A 02/14/2019   Procedure: ABDOMINAL AORTOGRAM W/ Bilateral LOWER EXTREMITY;  Surgeon: Iran Ouch, MD;  Location: [facility] INVASIVE CV LAB;  Service: Cardiovascular;  Laterality: N/A;  . CORONARY STENT INTERVENTION N/A 09/14/2017   Procedure: CORONARY STENT INTERVENTION;  Surgeon: Lennette Bihari, MD;  Location: [facility] INVASIVE CV LAB;  Service: Cardiovascular:: p-mRCA (  covering both lesions): Resolute Onyx DES 2.5 mm x 26 mm (~2.6 mm)  . LEFT HEART CATH AND CORONARY ANGIOGRAPHY N/A 09/14/2017   Procedure: LEFT HEART CATH AND CORONARY ANGIOGRAPHY;  Surgeon: Lennette BihariKelly, Thomas A, MD;  Location: [facility] INVASIVE CV LAB;  Service: Cardiovascular: mRCA 90% & pRCA 50% -> DES PCI.  p-mCx ~80%, m&dCx 70-75% (on review ~60-65% with focal ~70% eccentric lesion - calcified), pLAD ~50%  . NM MYOVIEW LTD  09/13/2017   EF 73%.  Inferior-inferolateral perfusion defect consistent with ischemia.  . TRANSTHORACIC ECHOCARDIOGRAM  09/13/2017    Mild LVH.  EF 60 to 65%.  No RWMA.  GR 1 DD.     Social History   reports that she quit smoking about 18 months ago. She smoked 0.50 packs per day. She has never used smokeless tobacco. She reports that she does not drink alcohol or use drugs.   Family History   Her family history includes Other in her mother.   Allergies No Known Allergies   Home Medications  Prior to Admission medications   Medication Sig Start Date End Date Taking?  Authorizing Provider  atorvastatin (LIPITOR) 40 MG tablet TAKE 1 TABLET (40 MG TOTAL) BY MOUTH DAILY AT 6 PM. Patient taking differently: Take 40 mg by mouth daily.  01/08/19  Yes Marykay LexHarding, David W, MD  carvedilol (COREG) 25 MG tablet TAKE 1 TABLET BY MOUTH TWICE A DAY Patient taking differently: Take 25 mg by mouth 2 (two) times daily.  12/11/18  Yes Marykay LexHarding, David W, MD  clopidogrel (PLAVIX) 75 MG tablet TAKE 1 TABLET BY MOUTH EVERY DAY Patient taking differently: Take 75 mg by mouth daily.  12/11/18  Yes Marykay LexHarding, David W, MD  hydrochlorothiazide (HYDRODIURIL) 25 MG tablet Take 1 tablet (25 mg total) by mouth daily. 12/11/18 03/25/2019 Yes Marykay LexHarding, David W, MD  lisinopril (ZESTRIL) 20 MG tablet TAKE 1 TABLET BY MOUTH EVERY DAY Patient taking differently: Take 20 mg by mouth daily.  09/11/18  Yes Marykay LexHarding, David W, MD     Critical care time: 40 mins     Joneen RoachPaul Amena Dockham, AGACNP-BC [facility] Pulmonary/Critical Care  See [facility] for personal pager PCCM on call pager [phone]  03/29/2019 12:36 AM

## 2019-03-28 NOTE — Progress Notes (Addendum)
FPTS Interim Progress Note  S: Received a page from ED nurse that patient blood pressure has been dropping and last two maps were 62, 67, 48 with desats in the 80s requiring an increase to 15 HFNC with non-rebreather mask. Patient also endorse new abdominal pain. She states this is new today but has had this pain before and is described as burning. She takes no medication for GERD and when asked if she has reflux she says "I hope not". By the time I went and saw the patient several minutes after I received the page her pain had improved. He rlast BM was 2 hours ago and she did say it was "black".  O: GI: soft, +bowel sounds, non-tender with palpation BP (!) 130/59   Pulse 65   Temp (!) 97.5 F (36.4 C) (Oral)   Resp (!) 31   SpO2 95%     A/P: Abdominal Pain - Acute, stable. Possible GERD Stat KUB Stat H/H with hemoccult.  GI cocktail  Start PPI in am  Hypotension 1L NaCl bolus, continue mIVFs Stopped Coreg CCM consulted and will see patient, appreciate recs  Acute Hypoxic Respiratory Failue, Acute, 2/2 to COVID PNA Cont 15L HFNC Cont Abx Cont Remdesivir and dexamethasone   Nuala Alpha, DO 2019-04-06, 10:28 PM PGY-3, [place] [facility] Service pager [phone]

## 2019-03-28 NOTE — ED Notes (Signed)
X-ray at bedside

## 2019-03-28 NOTE — Progress Notes (Addendum)
FPTS Interim Progress Note  Patient with worsening O2 need.  Now satting at 83% on 6 L oxygen.  Attending was in room at the time.  We will plan to consult CCM given worsening O2 status and Covid 19.  Patient is also full code.  O: BP (!) 111/49   Pulse 93   Temp (!) 97.5 F (36.4 C) (Oral)   Resp (!) 34   SpO2 (!) 83%     A/P: Acute hypoxic respiratory failure  Worsening O2 requirement to 6L O2, satting in low 80s-low 90s. Will contact CCM (Dr. Haywood Lasso was paged). Awaiting callback.   Caroline More, DO 2019-04-01, 3:11 PM PGY-3, [place] Medicine Service pager [phone]  Addendum:  Spoke to Dr. Loanne Drilling patient's worsening O2 sats.  Advised to continue current treatment including remdesivir, Decadron, and community-acquired pneumonia antibiotics of azithromycin and ceftriaxone.  Advised that patient will need to continue to prone.  Did advise that patient may be a good candidate for [facility].  I informed her that we had already called bed request and they said there was no beds.  She states that she will make a call and then call us back.  Spoke to patient's RN. Per RN patient requires 6L when on the phone. When off the phone patient's sats improve. Patient is now off the phone and she plans to wean O2. I advised patient to prone and RN stated she would do so.   Dalphine Handing, PGY-3 [place] [facility] 2019-04-01 3:25 PM   Addendum: Dr. Loanne Drilling confirmed there were no beds at [facility]. Will continue to monitor patient. Will not formally consult at this time.   Dalphine Handing, PGY-3 [facility] 04/01/19 3:41 PM

## 2019-03-28 NOTE — ED Notes (Signed)
NS 247ml bolus given  Pt's blood pressure improved at this time

## 2019-03-28 NOTE — ED Provider Notes (Signed)
[facility] EMERGENCY DEPARTMENT Provider Note   CSN: [id number] Arrival date & time: 04-06-2019  0820     History Chief Complaint  Patient presents with  . Abdominal Pain    Ashley Moran is a 68 y.o. female hx of CAD, HTN, HL, peripheral vascular disease with claudication here presenting with shortness of breath, abdominal pain and leg pain.  Having abdominal pain for the last 3 days.  States that it radiates down to both legs and she has worsening leg pain especially with walking.  Woke up today and she feels short of breath as well.  She was noted to have an oxygen level of 80% on room air and went up to 90% on 6 L.  Patient does not wear oxygen at home and denies any fevers. No known contacts with Covid.  Patient was immediately brought back to the room for evaluation. Of note, about a month ago, patient had a CT aortogram that showed a chronically occluded left common femoral artery with some collaterals.  Patient did not have any claudication symptoms at that time.  The history is provided by the patient.       Past Medical History:  Diagnosis Date  . Coronary artery disease involving native coronary artery of native heart with angina pectoris (HCC) 08/2017   Unstable Angina - heavy coronary Ca on CT, Inferior perfusion defect on Myoview -> Cath 90% mRCA (DES PCI), p-mCx 60-75% heavily calcified (Med Rx), mLAD 50%.   . Essential hypertension   . Heavy cigarette smoker (20-39 per day)    Trying to quit as of 08/2017  . Hyperlipidemia with target LDL less than 70     Patient Active Problem List   Diagnosis Date Noted  . Intermittent claudication (HCC) 12/11/2018  . Right leg swelling 09/20/2017  . Presence of drug coated stent in right coronary artery 09/15/2017  . Coronary artery disease involving native coronary artery of native heart with angina pectoris (HCC) 09/15/2017  . Coronary artery calcification seen on CAT scan 09/13/2017  . Hyperlipidemia due  to dietary fat intake 09/13/2017  . Essential hypertension 09/13/2017  . Former heavy cigarette smoker (20-39 per day) 09/13/2017    Past Surgical History:  Procedure Laterality Date  . ABDOMINAL AORTOGRAM W/LOWER EXTREMITY N/A 02/14/2019   Procedure: ABDOMINAL AORTOGRAM W/ Bilateral LOWER EXTREMITY;  Surgeon: Iran Ouch, MD;  Location: [facility] INVASIVE CV LAB;  Service: Cardiovascular;  Laterality: N/A;  . CORONARY STENT INTERVENTION N/A 09/14/2017   Procedure: CORONARY STENT INTERVENTION;  Surgeon: Lennette Bihari, MD;  Location: [facility] INVASIVE CV LAB;  Service: Cardiovascular:: p-mRCA (covering both lesions): Resolute Onyx DES 2.5 mm x 26 mm (~2.6 mm)  . LEFT HEART CATH AND CORONARY ANGIOGRAPHY N/A 09/14/2017   Procedure: LEFT HEART CATH AND CORONARY ANGIOGRAPHY;  Surgeon: Lennette Bihari, MD;  Location: [facility] INVASIVE CV LAB;  Service: Cardiovascular: mRCA 90% & pRCA 50% -> DES PCI.  p-mCx ~80%, m&dCx 70-75% (on review ~60-65% with focal ~70% eccentric lesion - calcified), pLAD ~50%  . NM MYOVIEW LTD  09/13/2017   EF 73%.  Inferior-inferolateral perfusion defect consistent with ischemia.  . TRANSTHORACIC ECHOCARDIOGRAM  09/13/2017    Mild LVH.  EF 60 to 65%.  No RWMA.  GR 1 DD.     OB History   No obstetric history on file.     Family History  Problem Relation Age of Onset  . Other Mother        irregular heartbeat  not on any medication    Social History   Tobacco Use  . Smoking status: Former Smoker    Packs/day: 0.50    Quit date: 09/12/2017    Years since quitting: 1.5  . Smokeless tobacco: Never Used  Substance Use Topics  . Alcohol use: No  . Drug use: No    Home Medications Prior to Admission medications   Medication Sig Start Date End Date Taking? Authorizing Provider  Aspirin-Caffeine (BACK & BODY EXTRA STRENGTH PO) Take 1 tablet by mouth 4 (four) times daily as needed (pain).     [provider]  atorvastatin (LIPITOR) 40 MG tablet TAKE 1 TABLET (40 MG  TOTAL) BY MOUTH DAILY AT 6 PM. 01/08/19   Marykay Lex, MD  carvedilol (COREG) 25 MG tablet TAKE 1 TABLET BY MOUTH TWICE A DAY 12/11/18   Marykay Lex, MD  clopidogrel (PLAVIX) 75 MG tablet TAKE 1 TABLET BY MOUTH EVERY DAY 12/11/18   Marykay Lex, MD  hydrochlorothiazide (HYDRODIURIL) 25 MG tablet Take 1 tablet (25 mg total) by mouth daily. 12/11/18 03/13/19  Marykay Lex, MD  lisinopril (ZESTRIL) 20 MG tablet TAKE 1 TABLET BY MOUTH EVERY DAY 09/11/18   Marykay Lex, MD  Omega-3 Fatty Acids (FISH OIL) 1000 MG CAPS Take 1,000 mg by mouth daily.    [provider]    Allergies    Patient has no known allergies.  Review of Systems   Review of Systems  Respiratory: Positive for shortness of breath.   Gastrointestinal: Positive for abdominal pain.  Musculoskeletal:       Leg pain   All other systems reviewed and are negative.   Physical Exam Updated Vital Signs BP (!) 134/55   Pulse 77   Temp (!) 97.5 F (36.4 C) (Oral)   Resp (!) 26   SpO2 93%   Physical Exam Vitals and nursing note reviewed.  Constitutional:      Comments: Chronically ill   HENT:     Head: Normocephalic.  Eyes:     Extraocular Movements: Extraocular movements intact.  Cardiovascular:     Rate and Rhythm: Regular rhythm. Tachycardia present.  Pulmonary:     Comments: Crackles R base  Abdominal:     General: Abdomen is flat. Bowel sounds are normal.     Palpations: Abdomen is soft.  Skin:    General: Skin is warm.     Comments: Bilateral lower extremities slightly cold but good femoral and popliteal pulses bilaterally. Not DP pulses bilaterally and bilateral feet are slightly colder than popliteal area   Neurological:     General: No focal deficit present.  Psychiatric:        Mood and Affect: Mood normal.     ED Results / Procedures / Treatments   Labs (all labs ordered are listed, but only abnormal results are displayed) Labs Reviewed  BASIC METABOLIC PANEL - Abnormal;  Notable for the following components:      Result Value   Sodium 129 (*)    Potassium 5.7 (*)    CO2 17 (*)    Glucose, Bld 340 (*)    BUN 55 (*)    Creatinine, Ser 1.80 (*)    Calcium 8.6 (*)    GFR calc non Af Amer 29 (*)    GFR calc Af Amer 33 (*)    All other components within normal limits  CBC - Abnormal; Notable for the following components:   RBC 5.30 (*)  Hemoglobin 11.7 (*)    MCV 70.9 (*)    MCH 22.1 (*)    Platelets 481 (*)    All other components within normal limits  LACTIC ACID, PLASMA - Abnormal; Notable for the following components:   Lactic Acid, Venous 2.9 (*)    All other components within normal limits  LACTATE DEHYDROGENASE - Abnormal; Notable for the following components:   LDH 665 (*)    All other components within normal limits  FERRITIN - Abnormal; Notable for the following components:   Ferritin 596 (*)    All other components within normal limits  C-REACTIVE PROTEIN - Abnormal; Notable for the following components:   CRP 13.8 (*)    All other components within normal limits  HEPATIC FUNCTION PANEL - Abnormal; Notable for the following components:   Albumin 3.0 (*)    AST 160 (*)    ALT 146 (*)    Bilirubin, Direct 0.3 (*)    All other components within normal limits  POC SARS CORONAVIRUS 2 AG -  ED - Abnormal; Notable for the following components:   SARS Coronavirus 2 Ag POSITIVE (*)    All other components within normal limits  POCT I-STAT 7, (LYTES, BLD GAS, ICA,H+H) - Abnormal; Notable for the following components:   pCO2 arterial 27.8 (*)    pO2, Arterial 51.0 (*)    Bicarbonate 15.8 (*)    TCO2 17 (*)    Acid-base deficit 8.0 (*)    Sodium 128 (*)    Calcium, Ion 1.14 (*)    All other components within normal limits  CULTURE, BLOOD (ROUTINE X 2)  CULTURE, BLOOD (ROUTINE X 2)  PROCALCITONIN  BRAIN NATRIURETIC PEPTIDE  LIPASE, BLOOD  TRIGLYCERIDES  URINALYSIS, ROUTINE W REFLEX MICROSCOPIC  LACTIC ACID, PLASMA  BLOOD GAS,  ARTERIAL  TROPONIN I (HIGH SENSITIVITY)  TROPONIN I (HIGH SENSITIVITY)    EKG EKG Interpretation  Date/Time:  Wednesday March 28 2019 08:43:10 EST Ventricular Rate:  88 PR Interval:  120 QRS Duration: 118 QT Interval:  378 QTC Calculation: 457 R Axis:   -50 Text Interpretation: Normal sinus rhythm Right bundle branch block Left anterior fascicular block Minimal voltage criteria for LVH, may be normal variant ( R in aVL ) Lateral infarct , age undetermined Cannot rule out Inferior infarct , age undetermined Abnormal ECG Bifasicular block new since previous Confirmed by Richardean Canal ([id number]) on 03/21/2019 9:05:10 AM   Radiology CT Chest Wo Contrast  Result Date: 03/16/2019 CLINICAL DATA:  68 year old female with flank pain. Concern for kidney stone. Positive COVID-19. EXAM: CT CHEST, ABDOMEN AND PELVIS WITHOUT CONTRAST TECHNIQUE: Multidetector CT imaging of the chest, abdomen and pelvis was performed following the standard protocol without IV contrast. COMPARISON:  Chest radiograph dated 03/06/2019 and CT of the chest abdomen pelvis dated 09/12/2017. FINDINGS: Evaluation of this exam is limited in the absence of intravenous contrast. CT CHEST FINDINGS Cardiovascular: There is no cardiomegaly or pericardial effusion. Three-vessel coronary vascular calcification noted. There is moderate atherosclerotic calcification of the thoracic aorta. No aneurysmal dilatation. The central pulmonary arteries appear grossly unremarkable on this noncontrast CT. Mediastinum/Nodes: No hilar or mediastinal adenopathy. Evaluation however is limited in the absence of intravenous contrast. The esophagus and the thyroid gland are grossly unremarkable as visualized. No mediastinal fluid collection. Lungs/Pleura: Diffuse bilateral ground-glass and hazy airspace opacities most consistent with multifocal pneumonia. Clinical correlation and follow-up to resolution recommended. There is mild bilateral lower lobe  bronchiectasis. There is no pleural effusion  or pneumothorax. The central airways are patent. Musculoskeletal: No chest wall mass or suspicious bone lesions identified. CT ABDOMEN PELVIS FINDINGS Evaluation of this exam is limited due to respiratory motion artifact. No intra-abdominal free air or free fluid. Hepatobiliary: Diffuse fatty infiltration of the liver. No intrahepatic biliary ductal dilatation. Stable 4 mm focus of calcification associated with the gallbladder similar to prior CT. No pericholecystic fluid or evidence of acute cholecystitis by CT. Pancreas: Unremarkable. No pancreatic ductal dilatation or surrounding inflammatory changes. Spleen: Normal in size without focal abnormality. Adrenals/Urinary Tract: The adrenal glands are unremarkable. There is no hydronephrosis or nephrolithiasis on either side. The visualized ureters and urinary bladder appear unremarkable. Stomach/Bowel: Moderate stool throughout the colon. No bowel obstruction or active inflammation. The appendix is normal. Vascular/Lymphatic: Advanced aortoiliac atherosclerotic disease. There is a 2.3 cm infrarenal abdominal aortic ectasia. This is similar to prior CT. No portal venous gas. There is no adenopathy. Reproductive: Probable small calcified uterine fibroid. No adnexal masses. Other: None Musculoskeletal: No acute or significant osseous findings. IMPRESSION: 1. Multifocal pneumonia. Clinical correlation and follow-up to resolution recommended. 2. No acute intra-abdominopelvic pathology. Specifically No hydronephrosis or nephrolithiasis. 3. Fatty liver. 4. No bowel obstruction or active inflammation. Normal appendix. 5. Aortic Atherosclerosis (ICD10-I70.0). Electronically Signed   By: Elgie Collard M.D.   On: 04/08/2019 11:49   DG Chest Port 1 View  Result Date: 04-08-19 CLINICAL DATA:  oxygen desaturation. Shortness of breath. History of coronary artery disease. EXAM: PORTABLE CHEST 1 VIEW COMPARISON:  09/12/2017  FINDINGS: Normal heart size. Multifocal, bilateral pulmonary opacities are noted and concerning for multifocal infection. This is most notable within the left midlung, left base and right upper lobe. No pleural effusion or edema. No acute osseous abnormality. IMPRESSION: 1. Findings compatible with bilateral multifocal pneumonia. Electronically Signed   By: Signa Kell M.D.   On: April 08, 2019 10:59   CT Renal Stone Study  Result Date: 08-Apr-2019 CLINICAL DATA:  68 year old female with flank pain. Concern for kidney stone. Positive COVID-19. EXAM: CT CHEST, ABDOMEN AND PELVIS WITHOUT CONTRAST TECHNIQUE: Multidetector CT imaging of the chest, abdomen and pelvis was performed following the standard protocol without IV contrast. COMPARISON:  Chest radiograph dated 2019-04-08 and CT of the chest abdomen pelvis dated 09/12/2017. FINDINGS: Evaluation of this exam is limited in the absence of intravenous contrast. CT CHEST FINDINGS Cardiovascular: There is no cardiomegaly or pericardial effusion. Three-vessel coronary vascular calcification noted. There is moderate atherosclerotic calcification of the thoracic aorta. No aneurysmal dilatation. The central pulmonary arteries appear grossly unremarkable on this noncontrast CT. Mediastinum/Nodes: No hilar or mediastinal adenopathy. Evaluation however is limited in the absence of intravenous contrast. The esophagus and the thyroid gland are grossly unremarkable as visualized. No mediastinal fluid collection. Lungs/Pleura: Diffuse bilateral ground-glass and hazy airspace opacities most consistent with multifocal pneumonia. Clinical correlation and follow-up to resolution recommended. There is mild bilateral lower lobe bronchiectasis. There is no pleural effusion or pneumothorax. The central airways are patent. Musculoskeletal: No chest wall mass or suspicious bone lesions identified. CT ABDOMEN PELVIS FINDINGS Evaluation of this exam is limited due to respiratory motion  artifact. No intra-abdominal free air or free fluid. Hepatobiliary: Diffuse fatty infiltration of the liver. No intrahepatic biliary ductal dilatation. Stable 4 mm focus of calcification associated with the gallbladder similar to prior CT. No pericholecystic fluid or evidence of acute cholecystitis by CT. Pancreas: Unremarkable. No pancreatic ductal dilatation or surrounding inflammatory changes. Spleen: Normal in size without focal abnormality. Adrenals/Urinary Tract: The adrenal  glands are unremarkable. There is no hydronephrosis or nephrolithiasis on either side. The visualized ureters and urinary bladder appear unremarkable. Stomach/Bowel: Moderate stool throughout the colon. No bowel obstruction or active inflammation. The appendix is normal. Vascular/Lymphatic: Advanced aortoiliac atherosclerotic disease. There is a 2.3 cm infrarenal abdominal aortic ectasia. This is similar to prior CT. No portal venous gas. There is no adenopathy. Reproductive: Probable small calcified uterine fibroid. No adnexal masses. Other: None Musculoskeletal: No acute or significant osseous findings. IMPRESSION: 1. Multifocal pneumonia. Clinical correlation and follow-up to resolution recommended. 2. No acute intra-abdominopelvic pathology. Specifically No hydronephrosis or nephrolithiasis. 3. Fatty liver. 4. No bowel obstruction or active inflammation. Normal appendix. 5. Aortic Atherosclerosis (ICD10-I70.0). Electronically Signed   By: Elgie CollardArash  Radparvar M.D.   On: 2018/07/23 11:49    Procedures Procedures (including critical care time)  CRITICAL CARE Performed by: Richardean Canalavid H Kaho Selle   Total critical care time: 40 minutes  Critical care time was exclusive of separately billable procedures and treating other patients.  Critical care was necessary to treat or prevent imminent or life-threatening deterioration.  Critical care was time spent personally by me on the following activities: development of treatment plan with patient  and/or surrogate as well as nursing, discussions with consultants, evaluation of patient's response to treatment, examination of patient, obtaining history from patient or surrogate, ordering and performing treatments and interventions, ordering and review of laboratory studies, ordering and review of radiographic studies, pulse oximetry and re-evaluation of patient's condition.   Medications Ordered in ED Medications  sodium chloride 0.9 % bolus 1,000 mL (1,000 mLs Intravenous Bolus from Bag 08/06/2018 1218)  dexamethasone (DECADRON) injection 10 mg (10 mg Intravenous Given 08/06/2018 1220)  fentaNYL (SUBLIMAZE) injection 12.5 mcg (12.5 mcg Intravenous Given 08/06/2018 1230)    ED Course  I have reviewed the triage vital signs and the nursing notes.  Pertinent labs & imaging results that were available during my care of the patient were reviewed by me and considered in my medical decision making (see chart for details).    MDM Rules/Calculators/A&P                      Ashley HainesLeticia Moran is a 68 y.o. female who presented with hypoxia, shortness of breath, abdominal pain, leg pain .  Patient has known peripheral vascular disease and I am concerned for possible thrombotic event.  She does have diminished pulses bilateral DP's.  She also has a known clotted left common femoral artery with collaterals.  She is also hypoxic as well and was complaining of some shortness of breath.  I am concerned for possible Covid versus pneumonia versus PE causing her shortness of breath.  Plan to get Covid preadmission labs and test for Covid and also get CT of aortobifemoral   12:37 PM Patient has AKI. CXR showed possible COVID. Patient is on 4 L [place] and o2 is around 96%. Unable to get CTA. CT chest/ab/pel showed COVID and no acute intra-abdominal process going on.  Patient now tells me that she went to a birthday party on December 15 and everyone tested positive so far.  And and concerned that was her exposure.  Given IV  steroids and will be admitted for hypoxia secondary to Covid.  Ashley HainesLeticia Moran was evaluated in Emergency Department on 2018/07/23 for the symptoms described in the history of present illness. She was evaluated in the context of the global COVID-19 pandemic, which necessitated consideration that the patient might be at risk for infection  with the SARS-CoV-2 virus that causes COVID-19. Institutional protocols and algorithms that pertain to the evaluation of patients at risk for COVID-19 are in a state of rapid change based on information released by regulatory bodies including the CDC and federal and state organizations. These policies and algorithms were followed during the patient's care in the ED.   Final Clinical Impression(s) / ED Diagnoses Final diagnoses:  None    Rx / DC Orders ED Discharge Orders    None       Drenda Freeze, MD 04-27-2019 1239

## 2019-03-28 NOTE — ED Notes (Signed)
Pt c/o severe abd pain   Pt moaning with pain

## 2019-03-28 NOTE — ED Notes (Signed)
Dinner Tray Ordered @ 1712. 

## 2019-03-28 NOTE — ED Notes (Signed)
Pt is resting on her left side, she turned herself off of her stomach & was noted to be sating in the mid 90's remaining on 6L via n/c. She is tolerating any & all activity of repositioning herself as well as using the bedpan by herself & her O2 is maintaining but not decreasing in need for supplemental oxygen.

## 2019-03-28 NOTE — ED Notes (Signed)
Please call daughter, Brayton Layman at [phone] with an update

## 2019-03-28 NOTE — H&P (Addendum)
Family Medicine Teaching [facility] Admission History and Physical Service Pager: [phone]  Patient name: Ashley HainesLeticia Moran Medical record number: [id number] Date of birth: 1951-03-11 Age: 68 y.o. Gender: female  Primary Care Provider: Patient, No Pcp Per Consultants: None Code Status: Full Preferred Emergency Contact: Daughter, Alvy BealMonica Moran, at [phone]  Chief Complaint: Shortness of breath and abdominal pain  Assessment and Plan: Ashley Moran is a 68 y.o. female presenting with shortness of breath and abdominal pain, found to have Covid pneumonia. PMH is significant for CAD with history of RCA PCI, PVD, HTN, HLD.  Acute hypoxic respiratory failure likely 2/2 COVID-19 pneumonia Patient found to be Covid positive on admission, and CXR and CT chest consistent with bilateral pneumonia.  Patient does not use home oxygen but had an oxygen requirement of 4 L on admission.  Her Covid infection also likely explains her abdominal pain and nausea and vomiting.  CT abdomen was negative for abdominal pathology.  Patient is afebrile and has stable vital signs other than her hypoxia.  Labs are also consistent with Covid infection, including CRP of 13.6 and LDH of 665.  Lactic acid is also elevated to 2.9. PCT is also elevated >150, so will initiate abx coverage. PE is also a consideration given patient's shortness of breath and Covid diagnosis, although her AKI prohibited a CTA. Well's score 0. Patient's lower extremity exam is not consistent with the presence of a DVT, and she has no risk factors for PE other than a Covid infection.  -Admit to progressive floor, attending Dr. Lum BabeEniola -Airborne and contact precautions due to Covid infection -Supplemental oxygen with goal O2 saturation >92%, wean as tolerated -Start remdesivir (12/30-) and dexamethasone (12/30-) -Obtain VQ scan -Trend D-dimer, CRP, and LDH daily, obtain D-dimer today -S/p 1 L normal saline bolus, start mIVF -NS  @100c /hr -Incentive spirometry -trend LA -start azithromycin & CTX (12/30-). Clarified with pharmacy to ensure best antibiotic course   CAD & PAD with h/o of stent placement Seen by [facility].  Cardiologist is Dr. Herbie BaltimoreHarding.  Last visit on 12/15 at which time there were no medications changes..  Patient with recent angiography in November showing mild iliac disease bilaterally, on the right side there is moderate stenosis in the common femoral artery with mild to moderate diffuse SFA disease and three-vessel runoff below the knee.  On the left side there was a long occlusion of the SFA and re-constitution distally. Home meds: carvedilol, plavix, atrovastatin  -continue home meds   Transaminitis AST 160 and ALT 146. Can consider reactionary from viral process. Previously wnl.  -monitor on daily CMP, will need to monitor closely given remdesivir use. Clarified with pharmacy on use of remdesivir with transaminitis    AKI Creatinine 1.80, elevated from baseline of 1.0. Likely component of dehydration as patient reports poor PO intake -NS@100cc /hr -monitor on daily CMP  Hyperglycemia Glucose elevated to 340, no h/o diabetes -A1C  HTN Somewhat hypotensive on admission. Have some component of whitecoat syndrome per her cardiologist. Home meds: carvedilol, HCTZ, lisinopril -hold HCTZ and lisinopril given AKI -continue carvedilol   HLD Home meds include atorvastatin. -Continue home meds.  FEN/GI: heart healthy Prophylaxis: heparin   Disposition: admit to progressive, attending Dr. Lum BabeEniola   History of Present Illness:  Ashley HainesLeticia Zee is a 68 y.o. female presenting with Covid pneumonia.    Patient has multiple sick contacts.  Patient went to a birthday party this month where everyone that was there is now tested positive for Covid.  She  was last found to test.  He did have a Covid test on 12/26 after finding out that everyone at party with Covid positive, but Covid test was negative.   Reports being asymptomatic until 3 days ago when she developed stomach pain and nausea. This morning, she began to have shortness of breath.  She reports vomiting this morning.  She does not have shortness of breath at baseline.    Review Of Systems: Per HPI with the following additions:   Review of Systems  Constitutional: Negative for chills and fever.  Respiratory: Positive for shortness of breath.   Cardiovascular: Negative for chest pain.  Gastrointestinal: Positive for abdominal pain and vomiting. Negative for diarrhea.  Genitourinary: Negative for dysuria.    Patient Active Problem List   Diagnosis Date Noted  . COVID-19 Apr 05, 2019  . Intermittent claudication ([place]) 12/11/2018  . Right leg swelling 09/20/2017  . Presence of drug coated stent in right coronary artery 09/15/2017  . Coronary artery disease involving native coronary artery of native heart with angina pectoris ([place]) 09/15/2017  . Coronary artery calcification seen on CAT scan 09/13/2017  . Hyperlipidemia due to dietary fat intake 09/13/2017  . Essential hypertension 09/13/2017  . Former heavy cigarette smoker (20-39 per day) 09/13/2017    Past Medical History: Past Medical History:  Diagnosis Date  . Coronary artery disease involving native coronary artery of native heart with angina pectoris ([place]) 08/2017   Unstable Angina - heavy coronary Ca on CT, Inferior perfusion defect on Myoview -> Cath 90% mRCA (DES PCI), p-mCx 60-75% heavily calcified (Med Rx), mLAD 50%.   . Essential hypertension   . Heavy cigarette smoker (20-39 per day)    Trying to quit as of 08/2017  . Hyperlipidemia with target LDL less than 70     Past Surgical History: Past Surgical History:  Procedure Laterality Date  . ABDOMINAL AORTOGRAM W/LOWER EXTREMITY N/A 02/14/2019   Procedure: ABDOMINAL AORTOGRAM W/ Bilateral LOWER EXTREMITY;  Surgeon: Wellington Hampshire, MD;  Location: [facility];  Service: Cardiovascular;  Laterality: N/A;   . CORONARY STENT INTERVENTION N/A 09/14/2017   Procedure: CORONARY STENT INTERVENTION;  Surgeon: Troy Sine, MD;  Location: [facility];  Service: Cardiovascular:: p-mRCA (covering both lesions): Resolute Onyx DES 2.5 mm x 26 mm (~2.6 mm)  . LEFT HEART CATH AND CORONARY ANGIOGRAPHY N/A 09/14/2017   Procedure: LEFT HEART CATH AND CORONARY ANGIOGRAPHY;  Surgeon: Troy Sine, MD;  Location: [facility];  Service: Cardiovascular: mRCA 90% & pRCA 50% -> DES PCI.  p-mCx ~80%, m&dCx 70-75% (on review ~60-65% with focal ~70% eccentric lesion - calcified), pLAD ~50%  . NM MYOVIEW LTD  09/13/2017   EF 73%.  Inferior-inferolateral perfusion defect consistent with ischemia.  . TRANSTHORACIC ECHOCARDIOGRAM  09/13/2017    Mild LVH.  EF 60 to 65%.  No RWMA.  GR 1 DD.    Social History: Social History   Tobacco Use  . Smoking status: Former Smoker    Packs/day: 0.50    Quit date: 09/12/2017    Years since quitting: 1.5  . Smokeless tobacco: Never Used  Substance Use Topics  . Alcohol use: No  . Drug use: No   Please also refer to relevant sections of EMR.  Family History: Family History  Problem Relation Age of Onset  . Other Mother        irregular heartbeat not on any medication    Allergies and Medications: No Known Allergies No  current facility-administered medications on file prior to encounter.   Current Outpatient Medications on File Prior to Encounter  Medication Sig Dispense Refill  . Aspirin-Caffeine (BACK & BODY EXTRA STRENGTH PO) Take 1 tablet by mouth 4 (four) times daily as needed (pain).     [name] atorvastatin (LIPITOR) 40 MG tablet TAKE 1 TABLET (40 MG TOTAL) BY MOUTH DAILY AT 6 PM. 90 tablet 3  . carvedilol (COREG) 25 MG tablet TAKE 1 TABLET BY MOUTH TWICE A DAY 180 tablet 3  . clopidogrel (PLAVIX) 75 MG tablet TAKE 1 TABLET BY MOUTH EVERY DAY 90 tablet 3  . hydrochlorothiazide (HYDRODIURIL) 25 MG tablet Take 1 tablet (25 mg total) by mouth daily. 90  tablet 3  . lisinopril (ZESTRIL) 20 MG tablet TAKE 1 TABLET BY MOUTH EVERY DAY 90 tablet 3  . Omega-3 Fatty Acids (FISH OIL) 1000 MG CAPS Take 1,000 mg by mouth daily.      Objective: BP (!) 118/48   Pulse 73   Temp (!) 97.5 F (36.4 C) (Oral)   Resp (!) 26   SpO2 94%  Exam: General: awake and alert, laying in bed, appears uncomfortable but when asked this is 2/2 having to urinate  Eyes: PERRL, EOMI, no scleral icterus  ENTM: moist mucous membranes  Neck: supple Cardiovascular: RRR, no MRG, pulses felt in DP, Pedal, and popliteal pulses  Respiratory: diffuse crackles, no increased WOB, speaking full sentences   Gastrointestinal: soft, non tender, non distended, bowel sounds present  MSK: no edema Derm: intact, no rashes  Neuro: no focal deficits  Psych: normal affect   Labs and Imaging: CBC BMET  Recent Labs  Lab 03/07/2019 0851 03/10/2019 1014  WBC 9.5  --   HGB 11.7* 12.6  HCT 37.6 37.0  PLT 481*  --    Recent Labs  Lab 03/10/2019 0851 03/04/2019 1014  NA 129* 128*  K 5.7* 4.9  CL 100  --   CO2 17*  --   BUN 55*  --   CREATININE 1.80*  --   GLUCOSE 340*  --   CALCIUM 8.6*  --       Ref. Range 03/27/2019 10:14  Sample type Unknown ARTERIAL  pH, Arterial Latest Ref Range: 7.350 - 7.450  7.360  pCO2 arterial Latest Ref Range: 32.0 - 48.0 mmHg 27.8 (L)  pO2, Arterial Latest Ref Range: 83.0 - 108.0 mmHg 51.0 (L)  TCO2 Latest Ref Range: 22 - 32 mmol/L 17 (L)  Acid-base deficit Latest Ref Range: 0.0 - 2.0 mmol/L 8.0 (H)  Bicarbonate Latest Ref Range: 20.0 - 28.0 mmol/L 15.8 (L)  O2 Saturation Latest Units: % 86.0  Patient temperature Unknown 97.5 F  Collection site Unknown RADIAL, ALLEN'S TEST ACCEPTABLE    Ref. Range 03/13/2019 10:00 03/19/2019 12:54  Troponin I (High Sensitivity) Latest Ref Range: <18 ng/L 7 8    Ref. Range 03/24/2019 10:00  B Natriuretic Peptide Latest Ref Range: 0.0 - 100.0 pg/mL 93.5  LDH Latest Ref Range: 98 - 192 U/L 665 (H)    Ref. Range  03/03/2019 10:00  CRP Latest Ref Range: <1.0 mg/dL 98.1 (H)  Lactic Acid, Venous Latest Ref Range: 0.5 - 1.9 mmol/L 2.9 (HH)  Procalcitonin Latest Units: ng/mL >150.00   CT Chest Wo Contrast  Result Date: 02/27/2019 CLINICAL DATA:  68 year old female with flank pain. Concern for kidney stone. Positive COVID-19. EXAM: CT CHEST, ABDOMEN AND PELVIS WITHOUT CONTRAST TECHNIQUE: Multidetector CT imaging of the chest, abdomen and pelvis was performed following the  standard protocol without IV contrast. COMPARISON:  Chest radiograph dated 03/12/2019 and CT of the chest abdomen pelvis dated 09/12/2017. FINDINGS: Evaluation of this exam is limited in the absence of intravenous contrast. CT CHEST FINDINGS Cardiovascular: There is no cardiomegaly or pericardial effusion. Three-vessel coronary vascular calcification noted. There is moderate atherosclerotic calcification of the thoracic aorta. No aneurysmal dilatation. The central pulmonary arteries appear grossly unremarkable on this noncontrast CT. Mediastinum/Nodes: No hilar or mediastinal adenopathy. Evaluation however is limited in the absence of intravenous contrast. The esophagus and the thyroid gland are grossly unremarkable as visualized. No mediastinal fluid collection. Lungs/Pleura: Diffuse bilateral ground-glass and hazy airspace opacities most consistent with multifocal pneumonia. Clinical correlation and follow-up to resolution recommended. There is mild bilateral lower lobe bronchiectasis. There is no pleural effusion or pneumothorax. The central airways are patent. Musculoskeletal: No chest wall mass or suspicious bone lesions identified. CT ABDOMEN PELVIS FINDINGS Evaluation of this exam is limited due to respiratory motion artifact. No intra-abdominal free air or free fluid. Hepatobiliary: Diffuse fatty infiltration of the liver. No intrahepatic biliary ductal dilatation. Stable 4 mm focus of calcification associated with the gallbladder similar to prior  CT. No pericholecystic fluid or evidence of acute cholecystitis by CT. Pancreas: Unremarkable. No pancreatic ductal dilatation or surrounding inflammatory changes. Spleen: Normal in size without focal abnormality. Adrenals/Urinary Tract: The adrenal glands are unremarkable. There is no hydronephrosis or nephrolithiasis on either side. The visualized ureters and urinary bladder appear unremarkable. Stomach/Bowel: Moderate stool throughout the colon. No bowel obstruction or active inflammation. The appendix is normal. Vascular/Lymphatic: Advanced aortoiliac atherosclerotic disease. There is a 2.3 cm infrarenal abdominal aortic ectasia. This is similar to prior CT. No portal venous gas. There is no adenopathy. Reproductive: Probable small calcified uterine fibroid. No adnexal masses. Other: None Musculoskeletal: No acute or significant osseous findings. IMPRESSION: 1. Multifocal pneumonia. Clinical correlation and follow-up to resolution recommended. 2. No acute intra-abdominopelvic pathology. Specifically No hydronephrosis or nephrolithiasis. 3. Fatty liver. 4. No bowel obstruction or active inflammation. Normal appendix. 5. Aortic Atherosclerosis (ICD10-I70.0). Electronically Signed   By: Elgie Collard M.D.   On: 03/15/2019 11:49   DG Chest Port 1 View  Result Date: 03/05/2019 CLINICAL DATA:  oxygen desaturation. Shortness of breath. History of coronary artery disease. EXAM: PORTABLE CHEST 1 VIEW COMPARISON:  09/12/2017 FINDINGS: Normal heart size. Multifocal, bilateral pulmonary opacities are noted and concerning for multifocal infection. This is most notable within the left midlung, left base and right upper lobe. No pleural effusion or edema. No acute osseous abnormality. IMPRESSION: 1. Findings compatible with bilateral multifocal pneumonia. Electronically Signed   By: Signa Kell M.D.   On: 03/01/2019 10:59   CT Renal Stone Study  Result Date: 03/21/2019 CLINICAL DATA:  68 year old female with  flank pain. Concern for kidney stone. Positive COVID-19. EXAM: CT CHEST, ABDOMEN AND PELVIS WITHOUT CONTRAST TECHNIQUE: Multidetector CT imaging of the chest, abdomen and pelvis was performed following the standard protocol without IV contrast. COMPARISON:  Chest radiograph dated 03/06/2019 and CT of the chest abdomen pelvis dated 09/12/2017. FINDINGS: Evaluation of this exam is limited in the absence of intravenous contrast. CT CHEST FINDINGS Cardiovascular: There is no cardiomegaly or pericardial effusion. Three-vessel coronary vascular calcification noted. There is moderate atherosclerotic calcification of the thoracic aorta. No aneurysmal dilatation. The central pulmonary arteries appear grossly unremarkable on this noncontrast CT. Mediastinum/Nodes: No hilar or mediastinal adenopathy. Evaluation however is limited in the absence of intravenous contrast. The esophagus and the thyroid gland are grossly  unremarkable as visualized. No mediastinal fluid collection. Lungs/Pleura: Diffuse bilateral ground-glass and hazy airspace opacities most consistent with multifocal pneumonia. Clinical correlation and follow-up to resolution recommended. There is mild bilateral lower lobe bronchiectasis. There is no pleural effusion or pneumothorax. The central airways are patent. Musculoskeletal: No chest wall mass or suspicious bone lesions identified. CT ABDOMEN PELVIS FINDINGS Evaluation of this exam is limited due to respiratory motion artifact. No intra-abdominal free air or free fluid. Hepatobiliary: Diffuse fatty infiltration of the liver. No intrahepatic biliary ductal dilatation. Stable 4 mm focus of calcification associated with the gallbladder similar to prior CT. No pericholecystic fluid or evidence of acute cholecystitis by CT. Pancreas: Unremarkable. No pancreatic ductal dilatation or surrounding inflammatory changes. Spleen: Normal in size without focal abnormality. Adrenals/Urinary Tract: The adrenal glands are  unremarkable. There is no hydronephrosis or nephrolithiasis on either side. The visualized ureters and urinary bladder appear unremarkable. Stomach/Bowel: Moderate stool throughout the colon. No bowel obstruction or active inflammation. The appendix is normal. Vascular/Lymphatic: Advanced aortoiliac atherosclerotic disease. There is a 2.3 cm infrarenal abdominal aortic ectasia. This is similar to prior CT. No portal venous gas. There is no adenopathy. Reproductive: Probable small calcified uterine fibroid. No adnexal masses. Other: None Musculoskeletal: No acute or significant osseous findings. IMPRESSION: 1. Multifocal pneumonia. Clinical correlation and follow-up to resolution recommended. 2. No acute intra-abdominopelvic pathology. Specifically No hydronephrosis or nephrolithiasis. 3. Fatty liver. 4. No bowel obstruction or active inflammation. Normal appendix. 5. Aortic Atherosclerosis (ICD10-I70.0). Electronically Signed   By: Elgie Collard M.D.   On: 04-04-19 11:49    EKG: Ventricular rate 88, QTc 457, new bifascicular block   Oralia Manis, DO 2019-04-04, 1:10 PM PGY-3, [facility] Family Medicine FPTS Intern pager: [phone], text pages welcome

## 2019-03-29 ENCOUNTER — Inpatient Hospital Stay (HOSPITAL_COMMUNITY): Payer: BC Managed Care – PPO

## 2019-03-29 DIAGNOSIS — R0603 Acute respiratory distress: Secondary | ICD-10-CM

## 2019-03-29 DIAGNOSIS — U071 COVID-19: Secondary | ICD-10-CM

## 2019-03-29 DIAGNOSIS — E861 Hypovolemia: Secondary | ICD-10-CM | POA: Insufficient documentation

## 2019-03-29 DIAGNOSIS — J80 Acute respiratory distress syndrome: Secondary | ICD-10-CM

## 2019-03-29 DIAGNOSIS — R0602 Shortness of breath: Secondary | ICD-10-CM

## 2019-03-29 LAB — CBC WITH DIFFERENTIAL/PLATELET
Abs Immature Granulocytes: 0.06 10*3/uL (ref 0.00–0.07)
Basophils Absolute: 0 10*3/uL (ref 0.0–0.1)
Basophils Relative: 0 %
Eosinophils Absolute: 0 10*3/uL (ref 0.0–0.5)
Eosinophils Relative: 0 %
HCT: 36.2 % (ref 36.0–46.0)
Hemoglobin: 11 g/dL — ABNORMAL LOW (ref 12.0–15.0)
Immature Granulocytes: 1 %
Lymphocytes Relative: 10 %
Lymphs Abs: 0.6 10*3/uL — ABNORMAL LOW (ref 0.7–4.0)
MCH: 21.7 pg — ABNORMAL LOW (ref 26.0–34.0)
MCHC: 30.4 g/dL (ref 30.0–36.0)
MCV: 71.4 fL — ABNORMAL LOW (ref 80.0–100.0)
Monocytes Absolute: 0.1 10*3/uL (ref 0.1–1.0)
Monocytes Relative: 1 %
Neutro Abs: 5.5 10*3/uL (ref 1.7–7.7)
Neutrophils Relative %: 88 %
Platelets: 424 10*3/uL — ABNORMAL HIGH (ref 150–400)
RBC: 5.07 MIL/uL (ref 3.87–5.11)
RDW: 14.5 % (ref 11.5–15.5)
WBC: 6.4 10*3/uL (ref 4.0–10.5)
nRBC: 0 % (ref 0.0–0.2)

## 2019-03-29 LAB — LACTATE DEHYDROGENASE: LDH: 593 U/L — ABNORMAL HIGH (ref 98–192)

## 2019-03-29 LAB — COMPREHENSIVE METABOLIC PANEL
ALT: 121 U/L — ABNORMAL HIGH (ref 0–44)
AST: 134 U/L — ABNORMAL HIGH (ref 15–41)
Albumin: 2.4 g/dL — ABNORMAL LOW (ref 3.5–5.0)
Alkaline Phosphatase: 63 U/L (ref 38–126)
Anion gap: 13 (ref 5–15)
BUN: 50 mg/dL — ABNORMAL HIGH (ref 8–23)
CO2: 17 mmol/L — ABNORMAL LOW (ref 22–32)
Calcium: 8.5 mg/dL — ABNORMAL LOW (ref 8.9–10.3)
Chloride: 109 mmol/L (ref 98–111)
Creatinine, Ser: 1.41 mg/dL — ABNORMAL HIGH (ref 0.44–1.00)
GFR calc Af Amer: 45 mL/min — ABNORMAL LOW (ref >60)
GFR calc non Af Amer: 38 mL/min — ABNORMAL LOW (ref >60)
Glucose, Bld: 276 mg/dL — ABNORMAL HIGH (ref 70–99)
Potassium: 5 mmol/L (ref 3.5–5.1)
Sodium: 139 mmol/L (ref 135–145)
Total Bilirubin: 0.6 mg/dL (ref 0.3–1.2)
Total Protein: 6.1 g/dL — ABNORMAL LOW (ref 6.5–8.1)

## 2019-03-29 LAB — GLUCOSE, CAPILLARY
Glucose-Capillary: 303 mg/dL — ABNORMAL HIGH (ref 70–99)
Glucose-Capillary: 279 mg/dL — ABNORMAL HIGH (ref 70–99)
Glucose-Capillary: 262 mg/dL — ABNORMAL HIGH (ref 70–99)
Glucose-Capillary: 121 mg/dL — ABNORMAL HIGH (ref 70–99)

## 2019-03-29 LAB — D-DIMER, QUANTITATIVE
D-Dimer, Quant: 1.29 ug/mL-FEU — ABNORMAL HIGH (ref 0.00–0.50)
D-Dimer, Quant: 1.36 ug/mL-FEU — ABNORMAL HIGH (ref 0.00–0.50)

## 2019-03-29 LAB — TROPONIN I (HIGH SENSITIVITY): Troponin I (High Sensitivity): 11 ng/L (ref <18)

## 2019-03-29 LAB — HEMOGLOBIN A1C
Hgb A1c MFr Bld: 11.9 % — ABNORMAL HIGH (ref 4.8–5.6)
Mean Plasma Glucose: 294.83 mg/dL

## 2019-03-29 LAB — LACTIC ACID, PLASMA
Lactic Acid, Venous: 2.6 mmol/L (ref 0.5–1.9)
Lactic Acid, Venous: 1.8 mmol/L (ref 0.5–1.9)

## 2019-03-29 LAB — C-REACTIVE PROTEIN: CRP: 14 mg/dL — ABNORMAL HIGH (ref <1.0)

## 2019-03-29 LAB — FERRITIN: Ferritin: 695 ng/mL — ABNORMAL HIGH (ref 11–307)

## 2019-03-29 LAB — STREP PNEUMONIAE URINARY ANTIGEN: Strep Pneumo Urinary Antigen: NEGATIVE

## 2019-03-29 MED ORDER — SODIUM ZIRCONIUM CYCLOSILICATE 5 G PO PACK
5.0000 g | PACK | Freq: Once | ORAL | Status: AC
  Administered 2019-03-29: 12:00:00 5 g via ORAL
  Filled 2019-03-29: qty 1

## 2019-03-29 MED ORDER — SODIUM BICARBONATE 8.4 % IV SOLN
50.0000 meq | Freq: Once | INTRAVENOUS | Status: AC
  Administered 2019-03-29: 50 meq via INTRAVENOUS
  Filled 2019-03-29: qty 50

## 2019-03-29 MED ORDER — CHLORHEXIDINE GLUCONATE CLOTH 2 % EX PADS
6.0000 | MEDICATED_PAD | Freq: Every day | CUTANEOUS | Status: DC
  Administered 2019-03-29 – 2019-04-09 (×12): 6 via TOPICAL

## 2019-03-29 MED ORDER — INSULIN ASPART 100 UNIT/ML ~~LOC~~ SOLN: 2.0000 [IU] | SUBCUTANEOUS | Status: DC

## 2019-03-29 MED ORDER — LACTATED RINGERS IV BOLUS
500.0000 mL | Freq: Once | INTRAVENOUS | Status: AC
  Administered 2019-03-29: 12:00:00 500 mL via INTRAVENOUS

## 2019-03-29 MED ORDER — SODIUM CHLORIDE 0.9 % IV BOLUS
1000.0000 mL | Freq: Once | INTRAVENOUS | Status: AC
  Administered 2019-03-29: 1000 mL via INTRAVENOUS

## 2019-03-29 MED ORDER — INSULIN DETEMIR 100 UNIT/ML ~~LOC~~ SOLN
12.0000 [IU] | Freq: Every day | SUBCUTANEOUS | Status: DC
  Administered 2019-03-29 – 2019-03-30 (×2): 12 [IU] via SUBCUTANEOUS
  Filled 2019-03-29 (×2): qty 0.12

## 2019-03-29 MED ORDER — ONDANSETRON HCL 4 MG/2ML IJ SOLN
4.0000 mg | Freq: Four times a day (QID) | INTRAMUSCULAR | Status: DC | PRN
  Administered 2019-03-30 (×2): 4 mg via INTRAVENOUS
  Filled 2019-03-29 (×2): qty 2

## 2019-03-29 MED ORDER — CALCIUM GLUCONATE-NACL 1-0.675 GM/50ML-% IV SOLN
1.0000 g | Freq: Once | INTRAVENOUS | Status: AC
  Administered 2019-03-29: 1000 mg via INTRAVENOUS
  Filled 2019-03-29: qty 50

## 2019-03-29 MED ORDER — ONDANSETRON HCL 4 MG/2ML IJ SOLN
4.0000 mg | Freq: Once | INTRAMUSCULAR | Status: AC
  Administered 2019-03-29: 12:00:00 4 mg via INTRAVENOUS
  Filled 2019-03-29: qty 2

## 2019-03-29 MED ORDER — INSULIN ASPART 100 UNIT/ML ~~LOC~~ SOLN
0.0000 [IU] | SUBCUTANEOUS | Status: DC
  Administered 2019-03-29 (×2): 11 [IU] via SUBCUTANEOUS
  Administered 2019-03-30: 3 [IU] via SUBCUTANEOUS
  Administered 2019-03-30: 4 [IU] via SUBCUTANEOUS
  Administered 2019-03-30 (×2): 7 [IU] via SUBCUTANEOUS
  Administered 2019-03-30: 11 [IU] via SUBCUTANEOUS
  Administered 2019-03-30: 4 [IU] via SUBCUTANEOUS
  Administered 2019-03-30 – 2019-03-31 (×2): 3 [IU] via SUBCUTANEOUS
  Administered 2019-03-31: 7 [IU] via SUBCUTANEOUS
  Administered 2019-03-31: 11 [IU] via SUBCUTANEOUS
  Administered 2019-03-31: 4 [IU] via SUBCUTANEOUS
  Administered 2019-03-31: 7 [IU] via SUBCUTANEOUS
  Administered 2019-03-31 – 2019-04-01 (×2): 4 [IU] via SUBCUTANEOUS
  Administered 2019-04-01: 3 [IU] via SUBCUTANEOUS
  Administered 2019-04-01 – 2019-04-02 (×4): 4 [IU] via SUBCUTANEOUS
  Administered 2019-04-02: 7 [IU] via SUBCUTANEOUS
  Administered 2019-04-02 (×2): 4 [IU] via SUBCUTANEOUS
  Administered 2019-04-02: 01:00:00 7 [IU] via SUBCUTANEOUS
  Administered 2019-04-02: 23:00:00 11 [IU] via SUBCUTANEOUS
  Administered 2019-04-03: 4 [IU] via SUBCUTANEOUS
  Administered 2019-04-03: 04:00:00 7 [IU] via SUBCUTANEOUS
  Administered 2019-04-03: 20:00:00 20 [IU] via SUBCUTANEOUS
  Administered 2019-04-03: 12:00:00 7 [IU] via SUBCUTANEOUS
  Administered 2019-04-03: 11 [IU] via SUBCUTANEOUS
  Administered 2019-04-03: 15 [IU] via SUBCUTANEOUS
  Administered 2019-04-04: 12:00:00 7 [IU] via SUBCUTANEOUS
  Administered 2019-04-04: 04:00:00 11 [IU] via SUBCUTANEOUS
  Administered 2019-04-04 (×3): 7 [IU] via SUBCUTANEOUS
  Administered 2019-04-04: 08:00:00 11 [IU] via SUBCUTANEOUS
  Administered 2019-04-05 (×2): 4 [IU] via SUBCUTANEOUS
  Administered 2019-04-05: 09:00:00 7 [IU] via SUBCUTANEOUS
  Administered 2019-04-05: 04:00:00 11 [IU] via SUBCUTANEOUS
  Administered 2019-04-05 (×2): 4 [IU] via SUBCUTANEOUS
  Administered 2019-04-06: 04:00:00 7 [IU] via SUBCUTANEOUS
  Administered 2019-04-06: 4 [IU] via SUBCUTANEOUS
  Administered 2019-04-06: 16:00:00 3 [IU] via SUBCUTANEOUS
  Administered 2019-04-06 (×2): 4 [IU] via SUBCUTANEOUS
  Administered 2019-04-07: 3 [IU] via SUBCUTANEOUS
  Administered 2019-04-07: 4 [IU] via SUBCUTANEOUS
  Administered 2019-04-07: 7 [IU] via SUBCUTANEOUS
  Administered 2019-04-07: 4 [IU] via SUBCUTANEOUS
  Administered 2019-04-07 – 2019-04-09 (×3): 3 [IU] via SUBCUTANEOUS
  Administered 2019-04-10 (×2): 4 [IU] via SUBCUTANEOUS

## 2019-03-29 NOTE — Progress Notes (Signed)
eLink Physician-Brief Progress Note Patient Name: Novice Vrba DOB: [birth year] MRN: [id number]   Date of Service  03/29/2019  HPI/Events of Note  Patient c/o nausea and vague abdominal pain. Similar symptoms improved with Zofran earlier today. QTc interval = 412 milliseconds.   eICU Interventions  Will order: 1. Zofran 4 mg IV Q 6 hours PRN N/V. 2. Monitor QTc interval Q 6 hours. Notify MD if QTc > 500 milliseconds.      Intervention Category Major Interventions: Other:  Guerline Happ Cornelia Copa 03/29/2019, 9:00 PM

## 2019-03-29 NOTE — Progress Notes (Signed)
FPTS Interim Progress Note  The patient's daughter Brayton Layman over the phone.  She was very appreciative of this.  Was very tearful on the phone because she says she just is worried about her mom.  I explained to her that mother is stable right now.  I did inform her that she required intensive care overnight but now is not requiring this.  Explained that she is still in the 15 L and we are hopeful that she will wean when she improves.  Explained to her treatment course including antivirals as well as antibiotics.  Brayton Layman has been trying to reach her mother but she is not picking up her cell phone.  Would like to FaceTime her mom if possible.  I called RN to discuss this.  She plans to call patient's daughter to help set this up.  RN also inform me that patient is stable on her 31 L nonrebreather.,  Was complaining of abdominal pain but this improved after taking a Zofran.   Caroline More, DO 03/29/2019, 3:40 PM PGY-3, [facility] Service pager [phone]

## 2019-03-29 NOTE — ED Notes (Signed)
Tried to call report to [id number], nurse stated they did not know they were getting a patient and they just left a code and needed time.

## 2019-03-29 NOTE — Progress Notes (Addendum)
eLink Physician-Brief Progress Note Patient Name: Ashley Moran DOB: 10-08-1950 MRN: [id number]   Date of Service  03/29/2019  HPI/Events of Note  Pt admitted to ICU for close monitoring secondary to COVID-19 infection with rapidly declining respiratory status, now requiring 15 liters high flow oxygen.  eICU Interventions  New Patient Evaluation completed.        Kerry Kass Shelba Susi 03/29/2019, 5:32 AM

## 2019-03-29 NOTE — Progress Notes (Addendum)
Family Medicine Teaching Service Daily Progress Note Intern Pager: [phone]  Patient name: Ashley Moran Medical record number: [id number] Date of birth: [birth year] Age: 68 y.o. Gender: female  Primary Care Provider: Patient, No Pcp Per Consultants: CCM Code Status: Full   Pt Overview and Major Events to Date:  Admitted to FPTS 12/30 Transferred to CCM ON on 12/30  Assessment and Plan: Ashley Moran is a 68 y.o. female presenting with shortness of breath and abdominal pain, found to have Covid pneumonia. PMH is significant for CAD with history of RCA PCI, PVD, HTN, HLD.  Acute hypoxic respiratory failure likely 2/2 COVID-19 pneumonia Patient currently satting at 89% on 15 L nonrebreather.  Overnight she acutely required increased oxygen and was transferred to CCM.  No intubation.  Patient reports some increased work of breathing but overall feels comfortable.  VQ scan was negative for PE.  CCM felt patient was stable for floor status today so F PTS will resume care. D dimer stable at 1.29 -Airborne and contact precautions due to Covid infection -Supplemental oxygen with goal O2 saturation >92%, wean as tolerated -CCM consulted, appreciate recommendations  -continue remdesivir (12/30-) and dexamethasone (12/30-) -urine strep, urine legionella, RVP -Trend D-dimer, CRP, and LDH daily, obtain D-dimer today -NS /hr -continue azithromycin & CTX (12/30-)   Hypotension with h/o HTN ON there was concern of need for pressors. Now BP stable at 120/55. Home meds: carvedilol, HCTZ, lisinopril -hold Home meds  CAD & PAD with h/o of stent placement Seen by [facility].  Cardiologist is Dr. Herbie [place].  Last visit on 12/15 at which time there were no medications changes..  Patient with recent angiography in November showing mild iliac disease bilaterally, on the right side there is moderate stenosis in the common femoral artery with mild to moderate diffuse SFA disease and three-vessel  runoff below the knee.  On the left side there was a long occlusion of the SFA and re-constitution distally. Reports continued leg pain but is the same as prior to hospitalization. Home meds: carvedilol, plavix, atrovastatin  -continue home meds  -tylenol PRN   Nausea Continues to have nausea -one time zofran   Transaminitis downtrending AST 134 & ALT 121.   -monitor on daily CMP, will need to monitor closely given remdesivir use    AKI Creatinine 1.41, baseline of 1.0. Likely component of dehydration as patient reports poor PO intake -NS@100cc /hr -monitor on daily CMP  Hyperglycemia A1C 11.9 on 12/30. No h/o diabetes in the past.  -SSI  HLD Home meds include atorvastatin. -Continue home meds.  FEN/GI: heart healthy Prophylaxis: heparin   Disposition: continued inpatient stay   Subjective:  Patient today appears more comfortable.  States that she is still short of breath but stable.  Does report increased pain in her legs but states this is been present since before admission.  Did have some nausea  Objective: Temp:  [98.2 F (36.8 C)-99.1 F (37.3 C)] 99.1 F (37.3 C) (12/31 1200) Pulse Rate:  [55-98] 98 (12/31 0830) Resp:  [21-40] 33 (12/31 0830) BP: (50-159)/(35-110) 120/55 (12/31 0830) SpO2:  [61 %-100 %] 92 % (12/31 0830) Weight:  [62.4 kg] 62.4 kg (12/31 0500) Physical Exam: General: awake and alert, [place] in place  Cardiovascular: RRR, no MRG  Respiratory: diffuse crackles  Abdomen: soft, non tender, non distended, bowel sounds present  Extremities: no edema, tenderness throughout   Laboratory: Recent Labs  Lab Apr 24, 2019 0851 2019/04/24 1014 24-Apr-2019 2220 03/29/19 0200  WBC 9.5  --   --  6.4  HGB 11.7* 12.6 11.1* 11.0*  HCT 37.6 37.0 36.2 36.2  PLT 481*  --   --  424*   Recent Labs  Lab 03/19/2019 0851 03/19/2019 1000 02/28/2019 1014 03/11/2019 2202 03/29/19 0200  NA 129*  --  128* 135 139  K 5.7*  --  4.9 5.7* 5.0  CL 100  --   --  108 109  CO2  17*  --   --  16* 17*  BUN 55*  --   --  52* 50*  CREATININE 1.80*  --   --  1.47* 1.41*  CALCIUM 8.6*  --   --  7.7* 8.5*  PROT  --  7.4  --   --  6.1*  BILITOT  --  1.0  --   --  0.6  ALKPHOS  --  63  --   --  63  ALT  --  146*  --   --  121*  AST  --  160*  --   --  134*  GLUCOSE 340*  --   --  306* 276*     Imaging/Diagnostic Tests: DG Abd 1 View  Result Date: 02/28/2019 CLINICAL DATA:  Abdominal pain for 3 days. EXAM: ABDOMEN - 1 VIEW COMPARISON:  CT earlier this day at 11:26 a.m. FINDINGS: Normal bowel gas pattern. No bowel dilatation to suggest obstruction. No free air. Vascular calcifications. Patchy bibasilar airspace disease. No acute osseous abnormalities are seen. IMPRESSION: 1. Normal bowel gas pattern. No free air. No acute findings radiographically from CT earlier this day. 2. Patchy bibasilar airspace disease. Electronically Signed   By: Keith Rake M.D.   On: 03/03/2019 23:31   CT Chest Wo Contrast  Result Date: 03/01/2019 CLINICAL DATA:  68 year old female with flank pain. Concern for kidney stone. Positive COVID-19. EXAM: CT CHEST, ABDOMEN AND PELVIS WITHOUT CONTRAST TECHNIQUE: Multidetector CT imaging of the chest, abdomen and pelvis was performed following the standard protocol without IV contrast. COMPARISON:  Chest radiograph dated 03/12/2019 and CT of the chest abdomen pelvis dated 09/12/2017. FINDINGS: Evaluation of this exam is limited in the absence of intravenous contrast. CT CHEST FINDINGS Cardiovascular: There is no cardiomegaly or pericardial effusion. Three-vessel coronary vascular calcification noted. There is moderate atherosclerotic calcification of the thoracic aorta. No aneurysmal dilatation. The central pulmonary arteries appear grossly unremarkable on this noncontrast CT. Mediastinum/Nodes: No hilar or mediastinal adenopathy. Evaluation however is limited in the absence of intravenous contrast. The esophagus and the thyroid gland are grossly  unremarkable as visualized. No mediastinal fluid collection. Lungs/Pleura: Diffuse bilateral ground-glass and hazy airspace opacities most consistent with multifocal pneumonia. Clinical correlation and follow-up to resolution recommended. There is mild bilateral lower lobe bronchiectasis. There is no pleural effusion or pneumothorax. The central airways are patent. Musculoskeletal: No chest wall mass or suspicious bone lesions identified. CT ABDOMEN PELVIS FINDINGS Evaluation of this exam is limited due to respiratory motion artifact. No intra-abdominal free air or free fluid. Hepatobiliary: Diffuse fatty infiltration of the liver. No intrahepatic biliary ductal dilatation. Stable 4 mm focus of calcification associated with the gallbladder similar to prior CT. No pericholecystic fluid or evidence of acute cholecystitis by CT. Pancreas: Unremarkable. No pancreatic ductal dilatation or surrounding inflammatory changes. Spleen: Normal in size without focal abnormality. Adrenals/Urinary Tract: The adrenal glands are unremarkable. There is no hydronephrosis or nephrolithiasis on either side. The visualized ureters and urinary bladder appear unremarkable. Stomach/Bowel: Moderate stool throughout the colon. No bowel obstruction or active inflammation. The appendix  is normal. Vascular/Lymphatic: Advanced aortoiliac atherosclerotic disease. There is a 2.3 cm infrarenal abdominal aortic ectasia. This is similar to prior CT. No portal venous gas. There is no adenopathy. Reproductive: Probable small calcified uterine fibroid. No adnexal masses. Other: None Musculoskeletal: No acute or significant osseous findings. IMPRESSION: 1. Multifocal pneumonia. Clinical correlation and follow-up to resolution recommended. 2. No acute intra-abdominopelvic pathology. Specifically No hydronephrosis or nephrolithiasis. 3. Fatty liver. 4. No bowel obstruction or active inflammation. Normal appendix. 5. Aortic Atherosclerosis (ICD10-I70.0).  Electronically Signed   By: Elgie CollardArash  Radparvar M.D.   On: 03/25/2019 11:49   NM Pulmonary Perfusion  Result Date: 03/10/2019 CLINICAL DATA:  Shortness of breath EXAM: NUCLEAR MEDICINE PERFUSION LUNG SCAN TECHNIQUE: Perfusion images were obtained in multiple projections after intravenous injection of radiopharmaceutical. Ventilation scans intentionally deferred if perfusion scan and chest x-ray adequate for interpretation during COVID 19 epidemic. RADIOPHARMACEUTICALS:  1.6 mCi Tc-4370m MAA IV COMPARISON:  CT chest dated March 28, 2019 FINDINGS: There is inhomogeneous radiotracer distribution on the perfusion portion of the study. There is no large defect. IMPRESSION: Inhomogeneous distribution of radiotracer favored to be secondary to the patient's pulmonary disease as seen on recent CT. There is no convincing evidence for a pulmonary embolism. Electronically Signed   By: Katherine Mantlehristopher  Green M.D.   On: 03/26/2019 19:02   DG Chest Port 1 View  Result Date: 03/21/2019 CLINICAL DATA:  oxygen desaturation. Shortness of breath. History of coronary artery disease. EXAM: PORTABLE CHEST 1 VIEW COMPARISON:  09/12/2017 FINDINGS: Normal heart size. Multifocal, bilateral pulmonary opacities are noted and concerning for multifocal infection. This is most notable within the left midlung, left base and right upper lobe. No pleural effusion or edema. No acute osseous abnormality. IMPRESSION: 1. Findings compatible with bilateral multifocal pneumonia. Electronically Signed   By: Signa Kellaylor  Stroud M.D.   On: 03/25/2019 10:59   CT Renal Stone Study  Result Date: 03/24/2019 CLINICAL DATA:  68 year old female with flank pain. Concern for kidney stone. Positive COVID-19. EXAM: CT CHEST, ABDOMEN AND PELVIS WITHOUT CONTRAST TECHNIQUE: Multidetector CT imaging of the chest, abdomen and pelvis was performed following the standard protocol without IV contrast. COMPARISON:  Chest radiograph dated 03/05/2019 and CT of the chest  abdomen pelvis dated 09/12/2017. FINDINGS: Evaluation of this exam is limited in the absence of intravenous contrast. CT CHEST FINDINGS Cardiovascular: There is no cardiomegaly or pericardial effusion. Three-vessel coronary vascular calcification noted. There is moderate atherosclerotic calcification of the thoracic aorta. No aneurysmal dilatation. The central pulmonary arteries appear grossly unremarkable on this noncontrast CT. Mediastinum/Nodes: No hilar or mediastinal adenopathy. Evaluation however is limited in the absence of intravenous contrast. The esophagus and the thyroid gland are grossly unremarkable as visualized. No mediastinal fluid collection. Lungs/Pleura: Diffuse bilateral ground-glass and hazy airspace opacities most consistent with multifocal pneumonia. Clinical correlation and follow-up to resolution recommended. There is mild bilateral lower lobe bronchiectasis. There is no pleural effusion or pneumothorax. The central airways are patent. Musculoskeletal: No chest wall mass or suspicious bone lesions identified. CT ABDOMEN PELVIS FINDINGS Evaluation of this exam is limited due to respiratory motion artifact. No intra-abdominal free air or free fluid. Hepatobiliary: Diffuse fatty infiltration of the liver. No intrahepatic biliary ductal dilatation. Stable 4 mm focus of calcification associated with the gallbladder similar to prior CT. No pericholecystic fluid or evidence of acute cholecystitis by CT. Pancreas: Unremarkable. No pancreatic ductal dilatation or surrounding inflammatory changes. Spleen: Normal in size without focal abnormality. Adrenals/Urinary Tract: The adrenal glands are unremarkable. There  is no hydronephrosis or nephrolithiasis on either side. The visualized ureters and urinary bladder appear unremarkable. Stomach/Bowel: Moderate stool throughout the colon. No bowel obstruction or active inflammation. The appendix is normal. Vascular/Lymphatic: Advanced aortoiliac  atherosclerotic disease. There is a 2.3 cm infrarenal abdominal aortic ectasia. This is similar to prior CT. No portal venous gas. There is no adenopathy. Reproductive: Probable small calcified uterine fibroid. No adnexal masses. Other: None Musculoskeletal: No acute or significant osseous findings. IMPRESSION: 1. Multifocal pneumonia. Clinical correlation and follow-up to resolution recommended. 2. No acute intra-abdominopelvic pathology. Specifically No hydronephrosis or nephrolithiasis. 3. Fatty liver. 4. No bowel obstruction or active inflammation. Normal appendix. 5. Aortic Atherosclerosis (ICD10-I70.0). Electronically Signed   By: Elgie Collard M.D.   On: 03/14/2019 11:49     Oralia Manis, DO 03/29/2019, 12:32 PM PGY-3, [facility] FPTS Intern pager: [phone], text pages welcome

## 2019-03-29 NOTE — ED Notes (Signed)
Tried to call report to [phone] for a 2nd time and was told they still were not ready for the pt.

## 2019-03-29 NOTE — Progress Notes (Signed)
Bilateral lower extremity venous duplex completed. Refer to "CV Proc" under chart review to view preliminary results.  03/29/2019 3:36 PM Kelby Aline., MHA, RVT, RDCS, RDMS

## 2019-03-29 NOTE — Progress Notes (Addendum)
Inpatient Diabetes Program Recommendations  AACE/ADA: New Consensus Statement on Inpatient Glycemic Control (2015)  Target Ranges:  Prepandial:   less than 140 mg/dL      Peak postprandial:   less than 180 mg/dL (1-2 hours)      Critically ill patients:  140 - 180 mg/dL   Lab Results  Component Value Date   GLUCAP 164 (H) 09/12/2017   HGBA1C 11.9 (H) 03/02/2019    Review of Glycemic Control Results for Ashley Moran, Ashley Moran (MRN [id number]) as of 03/29/2019 09:17  Ref. Range 02/27/2019 22:02 03/27/2019 22:20 03/29/2019 02:00  Glucose Latest Ref Range: 70 - 99 mg/dL 306 (H)  276 (H)   Diabetes history: Type 2 DM Outpatient Diabetes medications: none Current orders for Inpatient glycemic control: Novolog 1-3 units Q4H  Inpatient Diabetes Program Recommendations:    Consider adding Levemir 6 units BID or Levemir 12 units now, then QD.  Addendum: Discussed recommendations with Dr Chase Caller. If patient continuing to remain off vent and no tube feeds, could change correction to Novolog 0-20 units Q4H. If CBGs continue to persist >300 mg/dL following Levemir dose consider IV insulin.  Will follow.  Thanks, Bronson Curb, MSN, RNC-OB Diabetes Coordinator [phone] (8a-5p)

## 2019-03-29 NOTE — Progress Notes (Signed)
Attempted to call ED. No answer. Please call [phone] when ready to give report

## 2019-03-29 NOTE — ED Notes (Deleted)
SDU  Breakfast ordered  

## 2019-03-29 NOTE — Consult Note (Addendum)
NAME:  Ashley Moran, MRN:  [id number], DOB:  Mar 28, 1951, LOS: 1 ADMISSION DATE:  03/29/2019, CONSULTATION DATE:  12/30 REFERRING MD:  Dr. Erin Moran FPTS, CHIEF COMPLAINT:  Hypoxia COVID 50  Brief History   69 year old female ad mitted 12/30 with COVID pneumonia. She became progressively hypoxemic throughout the evening hours prompting PCCM consultation.   History of present illness   68 year old female with PMH as below, which is significant for CAD, PAD, HTN, HLD, and former smoker. She presented to [facility] ED 12/30 with complaints of dyspnea after having a known sick contact to someone with COVID-19 at a birthday party on 12/15. Also c/o abdominal pain and bilateral leg claudication of which she has a history. She required 6L [place] to maintain O2 sats or 90%. POC COVID antigen was positive. She was admitted to the [facility] for management of COVID pneumonia. In the evening hours of 12/30 she became progressively hypoxemic with escalating FiO2 demands up to 15L. PCCM was consulted.   Past Medical History   has a past medical history of Coronary artery disease involving native coronary artery of native heart with angina pectoris ([place]) (08/2017), Essential hypertension, Heavy cigarette smoker (20-39 per day), and Hyperlipidemia with target LDL less than 70.   Significant Hospital Events     Consults:  PCCM  Procedures:    Significant Diagnostic Tests:  CT chest 12/30> Multifocal pneumonia. CT renal stone study 12/30 > No acute intra-abdominopelvic pathology. Specifically No hydronephrosis or nephrolithiasis. Fatty liver. No bowel obstruction or active inflammation. Normal appendix V/Q 12/30 > no convincing evidence for PE  Micro Data:  POC SARS COV 2 antigen 12/30 > positive BC 12/30 >   Antimicrobials:  Ceftriaxone 12/30 > Azithromycin 12/30 >   Decadron 12/30 > Remdesivir 12/30 >  Interim history/subjective:    03/29/2019 - now in ICU. PCT > 150. On NRB 15L at 100%. BP  normal. She reports being of [place] heritage Denies major complaints but later told RN she has nausea and chronic cramping of lower extremity. Not proning  Objective   Blood pressure 103/84, pulse 71, temperature 98.2 F (36.8 C), temperature source Oral, resp. rate (!) 34, weight 62.4 kg, SpO2 99 %.        Intake/Output Summary (Last 24 hours) at 03/29/2019 0839 Last data filed at 03/29/2019 0800 Gross per 24 hour  Intake 3850 ml  Output 160 ml  Net 3690 ml   Filed Weights   03/29/19 0500  Weight: 62.4 kg     General Appearance:  Looks stable Head:  Normocephalic, without obvious abnormality, atraumatic Eyes:  PERRL - yes, conjunctiva/corneas - muddy     Ears:  Normal external ear canals, both ears Nose:  G tube - no Throat:  ETT TUBE - no , OG tube - no. FACE MASK + Neck:  Supple,  No enlargement/tenderness/nodules Lungs: Clear to auscultation bilaterally,  NO PARADOXUS. NO ACC MUSCLE USE.  Heart:  S1 and S2 normal, no murmur, CVP - no.  Pressors - no Abdomen:  Soft, no masses, no organomegaly Genitalia / Rectal:  Not done Extremities:  Extremities- intact Skin:  ntact in exposed areas . Sacral area - not examined Neurologic:  Sedation - none -> RASS - +1 . Moves all 4s - yes. CAM-ICU - neg . Orientation - x3+      Resolved Hospital Problem list     Assessment & Plan:   Acute hypoxemic respiratory failure: secondary to COVID-19 pneumonia +/- CAP -  03/29/2019 - acute resp failure due to covid 19 . Maintaing work of breathing . Not on HHFNC. On FM. Not dooing prine. Does not meet intubation criteria  Plan  - o2 for pulse ox > 88%  - HHFNC next If worse  - intubate if paradoxical or tiring out or mental status change  - avoid bipap to extent possibe  - do prone in bed 2:1 ratio for the day (emphasized to nursing)  COVID-19 with hypoxemia and infiltrates  plan -  Decadron x 10 days - Remdesivir x 5 days  Likely bacterial sepsis - source lung - basis: high  procalcitonin  03/29/2019 - afebrile  plan CAP coverage as above. - check urine strep  - check urine leg  - chjeck RVP    Hypotension:  03/29/2019 - resolved  Plan  - MAP goal > 65   AKI - likely due to dehydration   - 03/29/2019  - improved  Plan  - cotineu saline at 100cc/h  - fluid bous x500cc - hold home ace inhibitor  Electrolye imbalance  03/29/2019 - improved hyperkalemia  Plan  - lokelma x  1 -hold home ace inhibitor   Acute Transaminiits - likely due to covid  03/29/2019 - improving  Plan  - monitor  CAD -echo ef 65% in June 2019 PAD  03/29/2019 - c/o leg cramps and has nause  Plan  - cycle troponin -> if hight check echo for viral cardiomyopathy  - check d-dimer - check ekg (also monitor Qtc) - check duple LE  - zofran x 1 - holding home carvedilol, HCTZ, and lisinopril as she has been transiently hypotensive.   AT risk hyperglycemia  Plan  - start phase 1 hyperglycemia   Best practice:  Diet: NPO Pain/Anxiety/Delirium protocol (if indicated): NA VAP protocol (if indicated): NA DVT prophylaxis: heparin SQ GI prophylaxis: NA Glucose control: SSI Mobility: BR Code Status: FULL Family Communication:  Per FPTS  Disposition: ICU -> can move to Progressive later in day   Ccm will sign off     ATTESTATION & SIGNATURE   The patient Ashley Moran is critically ill with multiple organ systems failure and requires high complexity decision making for assessment and support, frequent evaluation and titration of therapies, application of advanced monitoring technologies and extensive interpretation of multiple databases.   Critical Care Time devoted to patient care services described in this note is  30  Minutes. This time reflects time of care of this signee Dr Kalman Shan. This critical care time does not reflect procedure time, or teaching time or supervisory time of PA/NP/Med student/Med Resident etc but could involve care  discussion time     Dr. Kalman Shan, M.D., [facility].C.P Pulmonary and Critical Care Medicine Staff Physician [facility] [facility] Pager: [phone], If no answer or between  15:00h - 7:00h: call [phone]  03/29/2019 8:39 AM     LABS   Results for KEIASHA, DIEP (MRN [id number]) as of 03/29/2019 08:57  Ref. Range 09/20/2017 12:07  D-dimer Latest Ref Range: 0.00 - 0.49 mg/L FEU 1.60 (H)  Results for JAVAEH, MUSCATELLO (MRN [id number]) as of 03/29/2019 08:57  Ref. Range 03/12/2019 10:00 03/29/2019 02:00  CRP Latest Ref Range: <1.0 mg/dL 23.5 (H) 57.3 (H)   PULMONARY Recent Labs  Lab 03/12/2019 1014  PHART 7.360  PCO2ART 27.8*  PO2ART 51.0*  HCO3 15.8*  TCO2 17*  O2SAT 86.0    CBC Recent Labs  Lab  Mar 29, 2019 0851 Mar 29, 2019 1014 Mar 29, 2019 2220 03/29/19 0200  HGB 11.7* 12.6 11.1* 11.0*  HCT 37.6 37.0 36.2 36.2  WBC 9.5  --   --  6.4  PLT 481*  --   --  424*    COAGULATION No results for input(s): INR in the last 168 hours.  CARDIAC  No results for input(s): TROPONINI in the last 168 hours. No results for input(s): PROBNP in the last 168 hours.   CHEMISTRY Recent Labs  Lab Mar 29, 2019 0851 Mar 29, 2019 1014 Mar 29, 2019 2202 03/29/19 0200  NA 129* 128* 135 139  K 5.7* 4.9 5.7* 5.0  CL 100  --  108 109  CO2 17*  --  16* 17*  GLUCOSE 340*  --  306* 276*  BUN 55*  --  52* 50*  CREATININE 1.80*  --  1.47* 1.41*  CALCIUM 8.6*  --  7.7* 8.5*   Estimated Creatinine Clearance: 31.1 mL/min (A) (by C-G formula based on SCr of 1.41 mg/dL (H)).   LIVER Recent Labs  Lab Mar 29, 2019 1000 03/29/19 0200  AST 160* 134*  ALT 146* 121*  ALKPHOS 63 63  BILITOT 1.0 0.6  PROT 7.4 6.1*  ALBUMIN 3.0* 2.4*     INFECTIOUS Recent Labs  Lab Mar 29, 2019 1000 Mar 29, 2019 2202 03/29/19 0212 03/29/19 0725  LATICACIDVEN 2.9* 1.8 2.6* 1.8  PROCALCITON >150.00  --   --   --      ENDOCRINE CBG (last 3)  No results for input(s): GLUCAP in the  last 72 hours.       IMAGING x48h  - image(s) personally visualized  -   highlighted in bold DG Abd 1 View  Result Date: Dec 31, 202020 CLINICAL DATA:  Abdominal pain for 3 days. EXAM: ABDOMEN - 1 VIEW COMPARISON:  CT earlier this day at 11:26 a.m. FINDINGS: Normal bowel gas pattern. No bowel dilatation to suggest obstruction. No free air. Vascular calcifications. Patchy bibasilar airspace disease. No acute osseous abnormalities are seen. IMPRESSION: 1. Normal bowel gas pattern. No free air. No acute findings radiographically from CT earlier this day. 2. Patchy bibasilar airspace disease. Electronically Signed   By: Narda RutherfordMelanie  Sanford M.D.   On: Dec 31, 202020 23:31   CT Chest Wo Contrast  Result Date: Dec 31, 202020 CLINICAL DATA:  68 year old female with flank pain. Concern for kidney stone. Positive COVID-19. EXAM: CT CHEST, ABDOMEN AND PELVIS WITHOUT CONTRAST TECHNIQUE: Multidetector CT imaging of the chest, abdomen and pelvis was performed following the standard protocol without IV contrast. COMPARISON:  Chest radiograph dated Dec 31, 202020 and CT of the chest abdomen pelvis dated 09/12/2017. FINDINGS: Evaluation of this exam is limited in the absence of intravenous contrast. CT CHEST FINDINGS Cardiovascular: There is no cardiomegaly or pericardial effusion. Three-vessel coronary vascular calcification noted. There is moderate atherosclerotic calcification of the thoracic aorta. No aneurysmal dilatation. The central pulmonary arteries appear grossly unremarkable on this noncontrast CT. Mediastinum/Nodes: No hilar or mediastinal adenopathy. Evaluation however is limited in the absence of intravenous contrast. The esophagus and the thyroid gland are grossly unremarkable as visualized. No mediastinal fluid collection. Lungs/Pleura: Diffuse bilateral ground-glass and hazy airspace opacities most consistent with multifocal pneumonia. Clinical correlation and follow-up to resolution recommended. There is mild  bilateral lower lobe bronchiectasis. There is no pleural effusion or pneumothorax. The central airways are patent. Musculoskeletal: No chest wall mass or suspicious bone lesions identified. CT ABDOMEN PELVIS FINDINGS Evaluation of this exam is limited due to respiratory motion artifact. No intra-abdominal free air or free fluid. Hepatobiliary: Diffuse fatty infiltration of the  liver. No intrahepatic biliary ductal dilatation. Stable 4 mm focus of calcification associated with the gallbladder similar to prior CT. No pericholecystic fluid or evidence of acute cholecystitis by CT. Pancreas: Unremarkable. No pancreatic ductal dilatation or surrounding inflammatory changes. Spleen: Normal in size without focal abnormality. Adrenals/Urinary Tract: The adrenal glands are unremarkable. There is no hydronephrosis or nephrolithiasis on either side. The visualized ureters and urinary bladder appear unremarkable. Stomach/Bowel: Moderate stool throughout the colon. No bowel obstruction or active inflammation. The appendix is normal. Vascular/Lymphatic: Advanced aortoiliac atherosclerotic disease. There is a 2.3 cm infrarenal abdominal aortic ectasia. This is similar to prior CT. No portal venous gas. There is no adenopathy. Reproductive: Probable small calcified uterine fibroid. No adnexal masses. Other: None Musculoskeletal: No acute or significant osseous findings. IMPRESSION: 1. Multifocal pneumonia. Clinical correlation and follow-up to resolution recommended. 2. No acute intra-abdominopelvic pathology. Specifically No hydronephrosis or nephrolithiasis. 3. Fatty liver. 4. No bowel obstruction or active inflammation. Normal appendix. 5. Aortic Atherosclerosis (ICD10-I70.0). Electronically Signed   By: Elgie Collard M.D.   On: 03/05/2019 11:49   NM Pulmonary Perfusion  Result Date: 03/14/2019 CLINICAL DATA:  Shortness of breath EXAM: NUCLEAR MEDICINE PERFUSION LUNG SCAN TECHNIQUE: Perfusion images were obtained in  multiple projections after intravenous injection of radiopharmaceutical. Ventilation scans intentionally deferred if perfusion scan and chest x-ray adequate for interpretation during COVID 19 epidemic. RADIOPHARMACEUTICALS:  1.6 mCi Tc-52m MAA IV COMPARISON:  CT chest dated March 28, 2019 FINDINGS: There is inhomogeneous radiotracer distribution on the perfusion portion of the study. There is no large defect. IMPRESSION: Inhomogeneous distribution of radiotracer favored to be secondary to the patient's pulmonary disease as seen on recent CT. There is no convincing evidence for a pulmonary embolism. Electronically Signed   By: Katherine Mantle M.D.   On: 03/09/2019 19:02   DG Chest Port 1 View  Result Date: 03/19/2019 CLINICAL DATA:  oxygen desaturation. Shortness of breath. History of coronary artery disease. EXAM: PORTABLE CHEST 1 VIEW COMPARISON:  09/12/2017 FINDINGS: Normal heart size. Multifocal, bilateral pulmonary opacities are noted and concerning for multifocal infection. This is most notable within the left midlung, left base and right upper lobe. No pleural effusion or edema. No acute osseous abnormality. IMPRESSION: 1. Findings compatible with bilateral multifocal pneumonia. Electronically Signed   By: Signa Kell M.D.   On: 03/04/2019 10:59   CT Renal Stone Study  Result Date: 03/06/2019 CLINICAL DATA:  68 year old female with flank pain. Concern for kidney stone. Positive COVID-19. EXAM: CT CHEST, ABDOMEN AND PELVIS WITHOUT CONTRAST TECHNIQUE: Multidetector CT imaging of the chest, abdomen and pelvis was performed following the standard protocol without IV contrast. COMPARISON:  Chest radiograph dated 03/25/2019 and CT of the chest abdomen pelvis dated 09/12/2017. FINDINGS: Evaluation of this exam is limited in the absence of intravenous contrast. CT CHEST FINDINGS Cardiovascular: There is no cardiomegaly or pericardial effusion. Three-vessel coronary vascular calcification noted.  There is moderate atherosclerotic calcification of the thoracic aorta. No aneurysmal dilatation. The central pulmonary arteries appear grossly unremarkable on this noncontrast CT. Mediastinum/Nodes: No hilar or mediastinal adenopathy. Evaluation however is limited in the absence of intravenous contrast. The esophagus and the thyroid gland are grossly unremarkable as visualized. No mediastinal fluid collection. Lungs/Pleura: Diffuse bilateral ground-glass and hazy airspace opacities most consistent with multifocal pneumonia. Clinical correlation and follow-up to resolution recommended. There is mild bilateral lower lobe bronchiectasis. There is no pleural effusion or pneumothorax. The central airways are patent. Musculoskeletal: No chest wall mass or suspicious bone  lesions identified. CT ABDOMEN PELVIS FINDINGS Evaluation of this exam is limited due to respiratory motion artifact. No intra-abdominal free air or free fluid. Hepatobiliary: Diffuse fatty infiltration of the liver. No intrahepatic biliary ductal dilatation. Stable 4 mm focus of calcification associated with the gallbladder similar to prior CT. No pericholecystic fluid or evidence of acute cholecystitis by CT. Pancreas: Unremarkable. No pancreatic ductal dilatation or surrounding inflammatory changes. Spleen: Normal in size without focal abnormality. Adrenals/Urinary Tract: The adrenal glands are unremarkable. There is no hydronephrosis or nephrolithiasis on either side. The visualized ureters and urinary bladder appear unremarkable. Stomach/Bowel: Moderate stool throughout the colon. No bowel obstruction or active inflammation. The appendix is normal. Vascular/Lymphatic: Advanced aortoiliac atherosclerotic disease. There is a 2.3 cm infrarenal abdominal aortic ectasia. This is similar to prior CT. No portal venous gas. There is no adenopathy. Reproductive: Probable small calcified uterine fibroid. No adnexal masses. Other: None Musculoskeletal: No acute  or significant osseous findings. IMPRESSION: 1. Multifocal pneumonia. Clinical correlation and follow-up to resolution recommended. 2. No acute intra-abdominopelvic pathology. Specifically No hydronephrosis or nephrolithiasis. 3. Fatty liver. 4. No bowel obstruction or active inflammation. Normal appendix. 5. Aortic Atherosclerosis (ICD10-I70.0). Electronically Signed   By: Elgie Collard M.D.   On: 04/12/2019 11:49

## 2019-03-29 NOTE — Progress Notes (Signed)
PHARMACY - PHYSICIAN COMMUNICATION CRITICAL VALUE ALERT - BLOOD CULTURE IDENTIFICATION (BCID)  Ashley Moran is an 68 y.o. female who presented to [facility] on 03/13/2019 with COVID-19 PNA concern for bacterial PNA as well.  Assessment:  1/4 bottles (aerobic) positive for gram positive cocci, likely a contaminant   Name of physician (or Provider) Contacted: Dr. Chase Caller  Current antibiotics: ceftriaxone and azithromycin  Changes to prescribed antibiotics recommended:  Recommendations accepted by provider  Continue current therapy for now  No results found for this or any previous visit.  [name] 03/29/2019  11:45 AM

## 2019-03-29 NOTE — Progress Notes (Signed)
FPTS Interim Progress Note  S: To see patient for PM check.  Patient is on 15 L nonrebreather mask and satting in the 90s.  She is speaking full sentences and smiling.  Is even able to laugh.  Dates she feels comfortable and denies any shortness of breath.  Denies any abdominal pain at this time and says that the nausea medicine relieve her abdominal pain.  Is reporting continued leg pain but she states this is unchanged from when she is followed up with her cardiologist. LE doppler was negative for DVT  While in the room RN and I were able to find patient cell phone and we were able to call patient's daughter Brayton Layman and put her on speaker.  [place] is a very Patent attorney of this.  They were able to have a conversation and patient was able to give her daughter updates.  O: BP (!) 98/41   Pulse 79   Temp 99.1 F (37.3 C) (Axillary)   Resp (!) 26   Wt 62.4 kg   SpO2 95%   BMI 27.79 kg/m   Gen: awake and alert, NAD, smiling Resp: CTAB, no wheezes, rales, rhonchi, speaking full sentences, no increased work of breathing, no accessory muscle use Abd: non tender, non distended Ext:  No edema.   A/P: COVID 19 Appears to be clinically improving as she seems in better spirits and denies any shortness of breath.  Is able to speak full sentences without increased work of breathing.   -Continue current treatment plan.   -Patient should continue nonrebreather until stable for wean.   -CCM consulted, appreciate recommendations -continue remdesivir and Decadron -Continue azithromycin and ceftriaxone -will monitor closely for signs of decompensation   Claudication Lower extremity Dopplers negative for DVT.  No signs of edema.  Unclear why patient has such bad leg pain however she states this is unchanged from when she saw her cardiologist.  Appears to be claudication and has significant history of CAD and PAD and multiple stents and revascularizations. -Continue to monitor  Abdominal pain Resolved  with the use of Zofran.  Currently not complaining of any abdominal pain or nausea -can consider adding Zofran as needed if further symptoms   Caroline More, DO 03/29/2019, 4:05 PM PGY-3, [facility] pager [phone]

## 2019-03-30 DIAGNOSIS — I10 Essential (primary) hypertension: Secondary | ICD-10-CM | POA: Diagnosis present

## 2019-03-30 DIAGNOSIS — J80 Acute respiratory distress syndrome: Secondary | ICD-10-CM | POA: Diagnosis not present

## 2019-03-30 DIAGNOSIS — E871 Hypo-osmolality and hyponatremia: Secondary | ICD-10-CM | POA: Diagnosis present

## 2019-03-30 DIAGNOSIS — A411 Sepsis due to other specified staphylococcus: Secondary | ICD-10-CM | POA: Diagnosis present

## 2019-03-30 DIAGNOSIS — Y95 Nosocomial condition: Secondary | ICD-10-CM | POA: Diagnosis present

## 2019-03-30 DIAGNOSIS — E785 Hyperlipidemia, unspecified: Secondary | ICD-10-CM | POA: Diagnosis present

## 2019-03-30 DIAGNOSIS — J1282 Pneumonia due to Coronavirus disease 2019: Secondary | ICD-10-CM | POA: Diagnosis present

## 2019-03-30 DIAGNOSIS — N179 Acute kidney failure, unspecified: Secondary | ICD-10-CM | POA: Diagnosis present

## 2019-03-30 DIAGNOSIS — E874 Mixed disorder of acid-base balance: Secondary | ICD-10-CM | POA: Diagnosis present

## 2019-03-30 DIAGNOSIS — E860 Dehydration: Secondary | ICD-10-CM | POA: Diagnosis present

## 2019-03-30 DIAGNOSIS — R739 Hyperglycemia, unspecified: Secondary | ICD-10-CM | POA: Diagnosis present

## 2019-03-30 DIAGNOSIS — R6521 Severe sepsis with septic shock: Secondary | ICD-10-CM | POA: Diagnosis not present

## 2019-03-30 DIAGNOSIS — E875 Hyperkalemia: Secondary | ICD-10-CM | POA: Diagnosis present

## 2019-03-30 DIAGNOSIS — I2510 Atherosclerotic heart disease of native coronary artery without angina pectoris: Secondary | ICD-10-CM | POA: Diagnosis present

## 2019-03-30 DIAGNOSIS — J159 Unspecified bacterial pneumonia: Secondary | ICD-10-CM | POA: Diagnosis present

## 2019-03-30 DIAGNOSIS — Z9911 Dependence on respirator [ventilator] status: Secondary | ICD-10-CM | POA: Diagnosis not present

## 2019-03-30 DIAGNOSIS — J9602 Acute respiratory failure with hypercapnia: Secondary | ICD-10-CM | POA: Diagnosis present

## 2019-03-30 DIAGNOSIS — I452 Bifascicular block: Secondary | ICD-10-CM | POA: Diagnosis present

## 2019-03-30 DIAGNOSIS — U071 COVID-19: Secondary | ICD-10-CM | POA: Diagnosis present

## 2019-03-30 DIAGNOSIS — D6489 Other specified anemias: Secondary | ICD-10-CM | POA: Diagnosis not present

## 2019-03-30 DIAGNOSIS — T380X5A Adverse effect of glucocorticoids and synthetic analogues, initial encounter: Secondary | ICD-10-CM | POA: Diagnosis not present

## 2019-03-30 DIAGNOSIS — I462 Cardiac arrest due to underlying cardiac condition: Secondary | ICD-10-CM | POA: Diagnosis not present

## 2019-03-30 DIAGNOSIS — G9341 Metabolic encephalopathy: Secondary | ICD-10-CM | POA: Diagnosis not present

## 2019-03-30 DIAGNOSIS — I70213 Atherosclerosis of native arteries of extremities with intermittent claudication, bilateral legs: Secondary | ICD-10-CM | POA: Diagnosis present

## 2019-03-30 DIAGNOSIS — R34 Anuria and oliguria: Secondary | ICD-10-CM | POA: Diagnosis not present

## 2019-03-30 DIAGNOSIS — J9601 Acute respiratory failure with hypoxia: Secondary | ICD-10-CM | POA: Diagnosis present

## 2019-03-30 LAB — COMPREHENSIVE METABOLIC PANEL
ALT: 365 U/L — ABNORMAL HIGH (ref 0–44)
AST: 508 U/L — ABNORMAL HIGH (ref 15–41)
Albumin: 2.3 g/dL — ABNORMAL LOW (ref 3.5–5.0)
Alkaline Phosphatase: 101 U/L (ref 38–126)
Anion gap: 7 (ref 5–15)
BUN: 43 mg/dL — ABNORMAL HIGH (ref 8–23)
CO2: 20 mmol/L — ABNORMAL LOW (ref 22–32)
Calcium: 8 mg/dL — ABNORMAL LOW (ref 8.9–10.3)
Chloride: 114 mmol/L — ABNORMAL HIGH (ref 98–111)
Creatinine, Ser: 1.1 mg/dL — ABNORMAL HIGH (ref 0.44–1.00)
GFR calc Af Amer: >60 mL/min (ref >60)
GFR calc non Af Amer: 52 mL/min — ABNORMAL LOW (ref >60)
Glucose, Bld: 179 mg/dL — ABNORMAL HIGH (ref 70–99)
Potassium: 6.1 mmol/L — ABNORMAL HIGH (ref 3.5–5.1)
Sodium: 141 mmol/L (ref 135–145)
Total Bilirubin: 0.4 mg/dL (ref 0.3–1.2)
Total Protein: 5.6 g/dL — ABNORMAL LOW (ref 6.5–8.1)

## 2019-03-30 LAB — GLUCOSE, CAPILLARY
Glucose-Capillary: 146 mg/dL — ABNORMAL HIGH (ref 70–99)
Glucose-Capillary: 150 mg/dL — ABNORMAL HIGH (ref 70–99)
Glucose-Capillary: 165 mg/dL — ABNORMAL HIGH (ref 70–99)
Glucose-Capillary: 175 mg/dL — ABNORMAL HIGH (ref 70–99)
Glucose-Capillary: 204 mg/dL — ABNORMAL HIGH (ref 70–99)
Glucose-Capillary: 208 mg/dL — ABNORMAL HIGH (ref 70–99)
Glucose-Capillary: 252 mg/dL — ABNORMAL HIGH (ref 70–99)

## 2019-03-30 LAB — CBC WITH DIFFERENTIAL/PLATELET
Abs Immature Granulocytes: 0.16 10*3/uL — ABNORMAL HIGH (ref 0.00–0.07)
Basophils Absolute: 0 10*3/uL (ref 0.0–0.1)
Basophils Relative: 0 %
Eosinophils Absolute: 0 10*3/uL (ref 0.0–0.5)
Eosinophils Relative: 0 %
HCT: 32.4 % — ABNORMAL LOW (ref 36.0–46.0)
Hemoglobin: 10.2 g/dL — ABNORMAL LOW (ref 12.0–15.0)
Immature Granulocytes: 1 %
Lymphocytes Relative: 6 %
Lymphs Abs: 0.8 10*3/uL (ref 0.7–4.0)
MCH: 21.7 pg — ABNORMAL LOW (ref 26.0–34.0)
MCHC: 31.5 g/dL (ref 30.0–36.0)
MCV: 69.1 fL — ABNORMAL LOW (ref 80.0–100.0)
Monocytes Absolute: 0.4 10*3/uL (ref 0.1–1.0)
Monocytes Relative: 3 %
Neutro Abs: 11.8 10*3/uL — ABNORMAL HIGH (ref 1.7–7.7)
Neutrophils Relative %: 90 %
Platelets: 470 10*3/uL — ABNORMAL HIGH (ref 150–400)
RBC: 4.69 MIL/uL (ref 3.87–5.11)
RDW: 14.5 % (ref 11.5–15.5)
WBC: 13.1 10*3/uL — ABNORMAL HIGH (ref 4.0–10.5)
nRBC: 0.3 % — ABNORMAL HIGH (ref 0.0–0.2)

## 2019-03-30 LAB — BASIC METABOLIC PANEL
Anion gap: 6 (ref 5–15)
Anion gap: 8 (ref 5–15)
BUN: 41 mg/dL — ABNORMAL HIGH (ref 8–23)
BUN: 38 mg/dL — ABNORMAL HIGH (ref 8–23)
CO2: 20 mmol/L — ABNORMAL LOW (ref 22–32)
CO2: 20 mmol/L — ABNORMAL LOW (ref 22–32)
Calcium: 7.9 mg/dL — ABNORMAL LOW (ref 8.9–10.3)
Calcium: 7.9 mg/dL — ABNORMAL LOW (ref 8.9–10.3)
Chloride: 117 mmol/L — ABNORMAL HIGH (ref 98–111)
Chloride: 112 mmol/L — ABNORMAL HIGH (ref 98–111)
Creatinine, Ser: 1.05 mg/dL — ABNORMAL HIGH (ref 0.44–1.00)
Creatinine, Ser: 1.31 mg/dL — ABNORMAL HIGH (ref 0.44–1.00)
GFR calc Af Amer: >60 mL/min (ref >60)
GFR calc Af Amer: 49 mL/min — ABNORMAL LOW (ref >60)
GFR calc non Af Amer: 55 mL/min — ABNORMAL LOW (ref >60)
GFR calc non Af Amer: 42 mL/min — ABNORMAL LOW (ref >60)
Glucose, Bld: 165 mg/dL — ABNORMAL HIGH (ref 70–99)
Glucose, Bld: 296 mg/dL — ABNORMAL HIGH (ref 70–99)
Potassium: 5.8 mmol/L — ABNORMAL HIGH (ref 3.5–5.1)
Potassium: 5.1 mmol/L (ref 3.5–5.1)
Sodium: 143 mmol/L (ref 135–145)
Sodium: 140 mmol/L (ref 135–145)

## 2019-03-30 LAB — D-DIMER, QUANTITATIVE: D-Dimer, Quant: 1.2 ug/mL-FEU — ABNORMAL HIGH (ref 0.00–0.50)

## 2019-03-30 LAB — TROPONIN I (HIGH SENSITIVITY): Troponin I (High Sensitivity): 16 ng/L (ref <18)

## 2019-03-30 LAB — CK TOTAL AND CKMB (NOT AT ARMC)
CK, MB: 4.6 ng/mL (ref 0.5–5.0)
Relative Index: INVALID (ref 0.0–2.5)
Total CK: 99 U/L (ref 38–234)

## 2019-03-30 LAB — CULTURE, BLOOD (ROUTINE X 2)

## 2019-03-30 LAB — LACTATE DEHYDROGENASE: LDH: 1088 U/L — ABNORMAL HIGH (ref 98–192)

## 2019-03-30 LAB — C-REACTIVE PROTEIN: CRP: 7 mg/dL — ABNORMAL HIGH (ref <1.0)

## 2019-03-30 LAB — LACTIC ACID, PLASMA: Lactic Acid, Venous: 1.6 mmol/L (ref 0.5–1.9)

## 2019-03-30 MED ORDER — INSULIN DETEMIR 100 UNIT/ML ~~LOC~~ SOLN
10.0000 [IU] | Freq: Two times a day (BID) | SUBCUTANEOUS | Status: DC
  Administered 2019-03-31 – 2019-04-02 (×6): 10 [IU] via SUBCUTANEOUS
  Filled 2019-03-30 (×8): qty 0.1

## 2019-03-30 MED ORDER — SODIUM ZIRCONIUM CYCLOSILICATE 5 G PO PACK
5.0000 g | PACK | Freq: Once | ORAL | Status: AC
  Administered 2019-03-30: 5 g via ORAL
  Filled 2019-03-30: qty 1

## 2019-03-30 MED ORDER — INSULIN DETEMIR 100 UNIT/ML ~~LOC~~ SOLN: 5.0000 [IU] | Freq: Every day | SUBCUTANEOUS | Status: DC

## 2019-03-30 MED ORDER — DEXTROSE IN LACTATED RINGERS 5 % IV SOLN: INTRAVENOUS | Status: DC

## 2019-03-30 MED ORDER — ORAL CARE MOUTH RINSE
15.0000 mL | Freq: Two times a day (BID) | OROMUCOSAL | Status: DC
  Administered 2019-03-30 – 2019-04-03 (×5): 15 mL via OROMUCOSAL

## 2019-03-30 NOTE — Progress Notes (Signed)
NAME:  Ashley Moran, MRN:  [id number], DOB:  1950/05/10, LOS: 2 ADMISSION DATE:  Apr 25, 2019, CONSULTATION DATE:  12/30 REFERRING MD:  Dr. Erin Hearing FPTS, CHIEF COMPLAINT:  Hypoxia COVID 72  Brief History   69 year old female ad mitted 12/30 with COVID pneumonia. She became progressively hypoxemic throughout the evening hours prompting PCCM consultation.   History of present illness   69 year old female with PMH as below, which is significant for CAD, PAD, HTN, HLD, and former smoker. She presented to [facility] ED 12/30 with complaints of dyspnea after having a known sick contact to someone with COVID-19 at a birthday party on 12/15. Also c/o abdominal pain and bilateral leg claudication of which she has a history. She required 6L [place] to maintain O2 sats or 90%. POC COVID antigen was positive. She was admitted to the [facility] for management of COVID pneumonia. In the evening hours of 12/30 she became progressively hypoxemic with escalating FiO2 demands up to 15L. PCCM was consulted.   Past Medical History   has a past medical history of Coronary artery disease involving native coronary artery of native heart with angina pectoris ([place]) (08/2017), Essential hypertension, Heavy cigarette smoker (20-39 per day), and Hyperlipidemia with target LDL less than 70.   Significant Hospital Events    12/31 - - now in ICU. PCT > 150. On NRB 15L at 100%. BP normal. She reports being of [place] heritage Denies major complaints but later told RN she has nausea and chronic cramping of lower extremity. Not proning   Consults:  PCCM  Procedures:    Significant Diagnostic Tests:  CT chest 12/30> Multifocal pneumonia. CT renal stone study 12/30 > No acute intra-abdominopelvic pathology. Specifically No hydronephrosis or nephrolithiasis. Fatty liver. No bowel obstruction or active inflammation. Normal appendix V/Q 12/30 > no convincing evidence for PE  Micro Data:  POC SARS COV 2 antigen 12/30 >  positive BC 12/30 >  HIV 12/30 - neg urnie strep 12/31 - neg  Antimicrobials:  Ceftriaxone 12/30 > Azithromycin 12/30 >   Decadron 12/30 > Remdesivir 12/30 >  Interim history/subjective:    03/30/2019 - on 15 L FM and 100%. Not proning well. Only lies on side. Concern hypoxemia worse - currently RR 30, fio2 need same and not paradoxical. Gets dyspneic trying to sit and pulse ox 77%. Eating makes hyer hypoxemc. CRP down to 7.0  Objective   Blood pressure (!) 132/55, pulse 71, temperature 97.8 F (36.6 C), temperature source Oral, resp. rate (!) 36, weight 62.4 kg, SpO2 91 %.        Intake/Output Summary (Last 24 hours) at 03/30/2019 0925 Last data filed at 03/30/2019 0600 Gross per 24 hour  Intake 1200 ml  Output 1530 ml  Net -330 ml   Filed Weights   03/29/19 0500  Weight: 62.4 kg   General Appearance:  Looks deconditioned. Lying on bed on side with face mask Head:  Normocephalic, without obvious abnormality, atraumatic Eyes:  PERRL - yes, conjunctiva/corneas - muddy     Ears:  Normal external ear canals, both ears Nose:  G tube - no Throat:  ETT TUBE - no , OG tube - no. FACE MASK + Neck:  Supple,  No enlargement/tenderness/nodules Lungs: Clear to auscultation bilaterally but RN reports some crackles on side. RR 30 . NOT PARADOXICAL Heart:  S1 and S2 normal, no murmur, CVP - .  Pressors - non Abdomen:  Soft, no masses, no organomegaly Genitalia / Rectal:  Not  done Extremities:  Extremities- intact Skin:  ntact in exposed areas . Sacral area - intact Neurologic:  Sedation - none -> RASS - +1 . Moves all 4s - yes. CAM-ICU - not tested . O[facility]         Resolved Hospital Problem list     Assessment & Plan:   Acute hypoxemic respiratory failure: secondary to COVID-19 pneumonia +/- CAP - 03/30/2019 - unchanged v slightly worse but does not meet intubation criteria  Plan  - o2 for pulse ox > 88%  -start  HHFNC  - intubate if paradoxical or  tiring out or mental status change  - avoid bipap to extent possibe  - do prone in bed 2:1 ratio for the day (emphasized to nursing) - she is not able to - enccourage at least lying on side  COVID-19 with hypoxemia and infiltrates  plan -  Decadron x 10 days - Remdesivir x 5 days - dc due to rise in LFT - Holding off TOCI (positive data in minorities EMPACTA trial) due to high PCT -> recheck PCT  Likely bacterial sepsis - source lung - basis: high procalcitonin  03/30/2019 - afeberile. Urine strep engative  plan CAP coverage as above. - wait  urine leg  - await RVP - recheck PCT   Hypotension:  03/30/2019 - resolved  Plan  - MAP goal > 65   AKI - likely due to dehydration   - 03/30/2019  - improved  Plan  - chane fluid to mainteance - hold home ace inhibitor  Electrolye imbalance  03/30/2019 -hyperkalemia + and Rx lokelma  Plan  - lokelma x  1 per earlier order -hold home ace inhibitor   Acute Transaminiits - likely due to covid  03/30/2019 - worse and AST is > 10X normal  Plan  - monitor - dc  remdesivir o  Nausea  03/30/19 - some nausea with eatng  Plan  - zofran prn  CAD -echo ef 65% in June 2019 PAD On chronic plavix  03/30/2019 -normal trop. D-dimer just > 1. Duplex LE negative. QTc 0.42 today per RN   Plan  -- holding home carvedilol, HCTZ, and lisinopril as she has been transiently hypotensive.   AT risk hyperglycemia  Plan  - ssi  Best practice:  Diet: NPO Pain/Anxiety/Delirium protocol (if indicated): NA VAP protocol (if indicated): NA DVT prophylaxis: heparin SQ standard dose + baseline plavix GI prophylaxis: NA Glucose control: SSI Mobility: BR Code Status: FULL Family Communication:  Per FPTS  Disposition: ICU  Ccm consult      ATTESTATION & SIGNATURE   The patient Jolayne HainesLeticia Breth is critically ill with multiple organ systems failure and requires high complexity decision making for assessment and support, frequent  evaluation and titration of therapies, application of advanced monitoring technologies and extensive interpretation of multiple databases.   Critical Care Time devoted to patient care services described in this note is  30  Minutes. This time reflects time of care of this signee Dr Kalman ShanMurali Kalasia Crafton. This critical care time does not reflect procedure time, or teaching time or supervisory time of PA/NP/Med student/Med Resident etc but could involve care discussion time     Dr. Kalman ShanMurali Niraj Kudrna, M.D., [facility]P Pulmonary and Critical Care Medicine Staff Physician [facility] Pager: [phone], If no answer or between  15:00h - 7:00h: call [phone]  03/30/2019 9:25 AM     LABS  Results for Jolayne HainesMCNEAL, Nevah (MRN [id number]) as of 03/29/2019 08:57  Ref. Range 09/20/2017 12:07  D-dimer Latest Ref Range: 0.00 - 0.49 mg/L FEU 1.60 (H)  Results for Jolayne HainesMCNEAL, Shebra (MRN [id number]) as of 03/29/2019 08:57  Ref. Range 03/08/2019 10:00 03/29/2019 02:00  CRP Latest Ref Range: <1.0 mg/dL 78.213.8 (H) 95.614.0 (H)   PULMONARY Recent Labs  Lab 03/17/2019 1014  PHART 7.360  PCO2ART 27.8*  PO2ART 51.0*  HCO3 15.8*  TCO2 17*  O2SAT 86.0    CBC Recent Labs  Lab 03/12/2019 0851 03/27/2019 2220 03/29/19 0200 03/30/19 0015  HGB 11.7* 11.1* 11.0* 10.2*  HCT 37.6 36.2 36.2 32.4*  WBC 9.5  --  6.4 13.1*  PLT 481*  --  424* 470*    COAGULATION No results for input(s): INR in the last 168 hours.  CARDIAC  No results for input(s): TROPONINI in the last 168 hours. No results for input(s): PROBNP in the last 168 hours.   CHEMISTRY Recent Labs  Lab 03/10/2019 0851 03/07/2019 1014 03/12/2019 2202 03/29/19 0200 03/30/19 0015 03/30/19 0655  NA 129* 128* 135 139 141 143  K 5.7* 4.9 5.7* 5.0 6.1* 5.8*  CL 100  --  108 109 114* 117*  CO2 17*  --  16* 17* 20* 20*  GLUCOSE 340*  --  306* 276* 179* 165*  BUN 55*  --  52* 50* 43* 41*  CREATININE 1.80*  --   1.47* 1.41* 1.10* 1.05*  CALCIUM 8.6*  --  7.7* 8.5* 8.0* 7.9*   Estimated Creatinine Clearance: 41.8 mL/min (A) (by C-G formula based on SCr of 1.05 mg/dL (H)).   LIVER Recent Labs  Lab 03/06/2019 1000 03/29/19 0200 03/30/19 0015  AST 160* 134* 508*  ALT 146* 121* 365*  ALKPHOS 63 63 101  BILITOT 1.0 0.6 0.4  PROT 7.4 6.1* 5.6*  ALBUMIN 3.0* 2.4* 2.3*     INFECTIOUS Recent Labs  Lab 03/20/2019 1000 03/29/19 0212 03/29/19 0725 03/30/19 0015  LATICACIDVEN 2.9* 2.6* 1.8 1.6  PROCALCITON >150.00  --   --   --      ENDOCRINE CBG (last 3)  Recent Labs    03/29/19 2359 03/30/19 0347 03/30/19 0802  GLUCAP 150* 175* 146*         IMAGING x48h  - image(s) personally visualized  -   highlighted in bold DG Abd 1 View  Result Date: 03/07/2019 CLINICAL DATA:  Abdominal pain for 3 days. EXAM: ABDOMEN - 1 VIEW COMPARISON:  CT earlier this day at 11:26 a.m. FINDINGS: Normal bowel gas pattern. No bowel dilatation to suggest obstruction. No free air. Vascular calcifications. Patchy bibasilar airspace disease. No acute osseous abnormalities are seen. IMPRESSION: 1. Normal bowel gas pattern. No free air. No acute findings radiographically from CT earlier this day. 2. Patchy bibasilar airspace disease. Electronically Signed   By: Narda RutherfordMelanie  Sanford M.D.   On: 03/20/2019 23:31   CT Chest Wo Contrast  Result Date: 03/08/2019 CLINICAL DATA:  69 year old female with flank pain. Concern for kidney stone. Positive COVID-19. EXAM: CT CHEST, ABDOMEN AND PELVIS WITHOUT CONTRAST TECHNIQUE: Multidetector CT imaging of the chest, abdomen and pelvis was performed following the standard protocol without IV contrast. COMPARISON:  Chest radiograph dated 03/15/2019 and CT of the chest abdomen pelvis dated 09/12/2017. FINDINGS: Evaluation of this exam is limited in the absence of intravenous contrast. CT CHEST FINDINGS Cardiovascular: There is no cardiomegaly or pericardial effusion. Three-vessel coronary  vascular calcification noted. There is moderate atherosclerotic calcification of the thoracic  aorta. No aneurysmal dilatation. The central pulmonary arteries appear grossly unremarkable on this noncontrast CT. Mediastinum/Nodes: No hilar or mediastinal adenopathy. Evaluation however is limited in the absence of intravenous contrast. The esophagus and the thyroid gland are grossly unremarkable as visualized. No mediastinal fluid collection. Lungs/Pleura: Diffuse bilateral ground-glass and hazy airspace opacities most consistent with multifocal pneumonia. Clinical correlation and follow-up to resolution recommended. There is mild bilateral lower lobe bronchiectasis. There is no pleural effusion or pneumothorax. The central airways are patent. Musculoskeletal: No chest wall mass or suspicious bone lesions identified. CT ABDOMEN PELVIS FINDINGS Evaluation of this exam is limited due to respiratory motion artifact. No intra-abdominal free air or free fluid. Hepatobiliary: Diffuse fatty infiltration of the liver. No intrahepatic biliary ductal dilatation. Stable 4 mm focus of calcification associated with the gallbladder similar to prior CT. No pericholecystic fluid or evidence of acute cholecystitis by CT. Pancreas: Unremarkable. No pancreatic ductal dilatation or surrounding inflammatory changes. Spleen: Normal in size without focal abnormality. Adrenals/Urinary Tract: The adrenal glands are unremarkable. There is no hydronephrosis or nephrolithiasis on either side. The visualized ureters and urinary bladder appear unremarkable. Stomach/Bowel: Moderate stool throughout the colon. No bowel obstruction or active inflammation. The appendix is normal. Vascular/Lymphatic: Advanced aortoiliac atherosclerotic disease. There is a 2.3 cm infrarenal abdominal aortic ectasia. This is similar to prior CT. No portal venous gas. There is no adenopathy. Reproductive: Probable small calcified uterine fibroid. No adnexal masses. Other:  None Musculoskeletal: No acute or significant osseous findings. IMPRESSION: 1. Multifocal pneumonia. Clinical correlation and follow-up to resolution recommended. 2. No acute intra-abdominopelvic pathology. Specifically No hydronephrosis or nephrolithiasis. 3. Fatty liver. 4. No bowel obstruction or active inflammation. Normal appendix. 5. Aortic Atherosclerosis (ICD10-I70.0). Electronically Signed   By: Elgie Collard M.D.   On: 04/17/2019 11:49   NM Pulmonary Perfusion  Result Date: 2019/04/17 CLINICAL DATA:  Shortness of breath EXAM: NUCLEAR MEDICINE PERFUSION LUNG SCAN TECHNIQUE: Perfusion images were obtained in multiple projections after intravenous injection of radiopharmaceutical. Ventilation scans intentionally deferred if perfusion scan and chest x-ray adequate for interpretation during COVID 19 epidemic. RADIOPHARMACEUTICALS:  1.6 mCi Tc-65m MAA IV COMPARISON:  CT chest dated 04-17-19 FINDINGS: There is inhomogeneous radiotracer distribution on the perfusion portion of the study. There is no large defect. IMPRESSION: Inhomogeneous distribution of radiotracer favored to be secondary to the patient's pulmonary disease as seen on recent CT. There is no convincing evidence for a pulmonary embolism. Electronically Signed   By: Katherine Mantle M.D.   On: 2019-04-17 19:02   DG Chest Port 1 View  Result Date: 04-17-2019 CLINICAL DATA:  oxygen desaturation. Shortness of breath. History of coronary artery disease. EXAM: PORTABLE CHEST 1 VIEW COMPARISON:  09/12/2017 FINDINGS: Normal heart size. Multifocal, bilateral pulmonary opacities are noted and concerning for multifocal infection. This is most notable within the left midlung, left base and right upper lobe. No pleural effusion or edema. No acute osseous abnormality. IMPRESSION: 1. Findings compatible with bilateral multifocal pneumonia. Electronically Signed   By: Signa Kell M.D.   On: 04/17/2019 10:59   CT Renal Stone  Study  Result Date: 17-Apr-2019 CLINICAL DATA:  69 year old female with flank pain. Concern for kidney stone. Positive COVID-19. EXAM: CT CHEST, ABDOMEN AND PELVIS WITHOUT CONTRAST TECHNIQUE: Multidetector CT imaging of the chest, abdomen and pelvis was performed following the standard protocol without IV contrast. COMPARISON:  Chest radiograph dated 04-17-19 and CT of the chest abdomen pelvis dated 09/12/2017. FINDINGS: Evaluation of this exam is limited  in the absence of intravenous contrast. CT CHEST FINDINGS Cardiovascular: There is no cardiomegaly or pericardial effusion. Three-vessel coronary vascular calcification noted. There is moderate atherosclerotic calcification of the thoracic aorta. No aneurysmal dilatation. The central pulmonary arteries appear grossly unremarkable on this noncontrast CT. Mediastinum/Nodes: No hilar or mediastinal adenopathy. Evaluation however is limited in the absence of intravenous contrast. The esophagus and the thyroid gland are grossly unremarkable as visualized. No mediastinal fluid collection. Lungs/Pleura: Diffuse bilateral ground-glass and hazy airspace opacities most consistent with multifocal pneumonia. Clinical correlation and follow-up to resolution recommended. There is mild bilateral lower lobe bronchiectasis. There is no pleural effusion or pneumothorax. The central airways are patent. Musculoskeletal: No chest wall mass or suspicious bone lesions identified. CT ABDOMEN PELVIS FINDINGS Evaluation of this exam is limited due to respiratory motion artifact. No intra-abdominal free air or free fluid. Hepatobiliary: Diffuse fatty infiltration of the liver. No intrahepatic biliary ductal dilatation. Stable 4 mm focus of calcification associated with the gallbladder similar to prior CT. No pericholecystic fluid or evidence of acute cholecystitis by CT. Pancreas: Unremarkable. No pancreatic ductal dilatation or surrounding inflammatory changes. Spleen: Normal in size  without focal abnormality. Adrenals/Urinary Tract: The adrenal glands are unremarkable. There is no hydronephrosis or nephrolithiasis on either side. The visualized ureters and urinary bladder appear unremarkable. Stomach/Bowel: Moderate stool throughout the colon. No bowel obstruction or active inflammation. The appendix is normal. Vascular/Lymphatic: Advanced aortoiliac atherosclerotic disease. There is a 2.3 cm infrarenal abdominal aortic ectasia. This is similar to prior CT. No portal venous gas. There is no adenopathy. Reproductive: Probable small calcified uterine fibroid. No adnexal masses. Other: None Musculoskeletal: No acute or significant osseous findings. IMPRESSION: 1. Multifocal pneumonia. Clinical correlation and follow-up to resolution recommended. 2. No acute intra-abdominopelvic pathology. Specifically No hydronephrosis or nephrolithiasis. 3. Fatty liver. 4. No bowel obstruction or active inflammation. Normal appendix. 5. Aortic Atherosclerosis (ICD10-I70.0). Electronically Signed   By: Elgie Collard M.D.   On: 03/23/2019 11:49   VAS [place] LOWER EXTREMITY VENOUS (DVT)  Result Date: 03/30/2019  Lower Venous Study Indications: COVID-19 positive. Acute respiratory failure.  Limitations: Body habitus, poor ultrasound/tissue interface and Suboptimal patient position, patient movement secondary to pain. Comparison Study: No prior study. Performing Technologist: Gertie Fey MHA, RDMS, RVT, RDCS  Examination Guidelines: A complete evaluation includes B-mode imaging, spectral Doppler, color Doppler, and power Doppler as needed of all accessible portions of each vessel. Bilateral testing is considered an integral part of a complete examination. Limited examinations for reoccurring indications may be performed as noted.  +---------+---------------+---------+-----------+----------+--------------+ RIGHT    CompressibilityPhasicitySpontaneityPropertiesThrombus Aging  +---------+---------------+---------+-----------+----------+--------------+ CFV                                                   Not visualized +---------+---------------+---------+-----------+----------+--------------+ SFJ                                                   Not visualized +---------+---------------+---------+-----------+----------+--------------+ FV Prox  Full                                                        +---------+---------------+---------+-----------+----------+--------------+  FV Mid   Full                                                        +---------+---------------+---------+-----------+----------+--------------+ FV DistalFull                                                        +---------+---------------+---------+-----------+----------+--------------+ PFV                                                   Not visualized +---------+---------------+---------+-----------+----------+--------------+ POP      Full           Yes      Yes                                 +---------+---------------+---------+-----------+----------+--------------+ PTV      Full                                                        +---------+---------------+---------+-----------+----------+--------------+ PERO                                                  Not visualized +---------+---------------+---------+-----------+----------+--------------+   +---------+---------------+---------+-----------+----------+--------------+ LEFT     CompressibilityPhasicitySpontaneityPropertiesThrombus Aging +---------+---------------+---------+-----------+----------+--------------+ CFV                                                   Not visualized +---------+---------------+---------+-----------+----------+--------------+ SFJ                                                   Not visualized  +---------+---------------+---------+-----------+----------+--------------+ FV Prox                                               Not visualized +---------+---------------+---------+-----------+----------+--------------+ FV Mid                                                Not visualized +---------+---------------+---------+-----------+----------+--------------+ FV Distal  Not visualized +---------+---------------+---------+-----------+----------+--------------+ PFV                                                   Not visualized +---------+---------------+---------+-----------+----------+--------------+ POP      Full           Yes      Yes                                 +---------+---------------+---------+-----------+----------+--------------+ PTV      Full                                                        +---------+---------------+---------+-----------+----------+--------------+ PERO     Full                                                        +---------+---------------+---------+-----------+----------+--------------+     Summary: Right: There is no evidence of deep vein thrombosis in the lower extremity. However, portions of this examination were limited- see technologist comments above. No cystic structure found in the popliteal fossa. Left: There is no evidence of deep vein thrombosis in the lower extremity. However, portions of this examination were limited- see technologist comments above. No cystic structure found in the popliteal fossa.  *See table(s) above for measurements and observations. Electronically signed by Waverly Ferrari MD on 03/30/2019 at 6:26:32 AM.    Final

## 2019-03-30 NOTE — Progress Notes (Signed)
eLink Physician-Brief Progress Note Patient Name: Ashley Moran DOB: 1951/01/25 MRN: [id number]   Date of Service  03/30/2019  HPI/Events of Note  Hyperkalemia - K+ = 6.1.  eICU Interventions  Will order: 1. Lokelma 5 gm PO now.      Intervention Category Major Interventions: Electrolyte abnormality - evaluation and management  [name] Eugene 03/30/2019, 2:59 AM

## 2019-03-30 NOTE — Progress Notes (Signed)
Asked MD about pt eating because whenever she takes off the nonrebreather she desats quickly and it increases her work of breathing. He said it was good for her to eat still to try to keep her energy up. I asked him if we should worry about aspiration and he said not when she has a good cough.

## 2019-03-30 NOTE — Progress Notes (Signed)
RT NOTE: RT attempted HHFNC with RN at bedside. Patient's sat did not increase above 83% on HHFNC with good waveform and encouragement to breathe in through her nose by RT and RN. RT placed patient back on NRB. MD made aware. RT will attempt again later. RT will continue to monitor.

## 2019-03-30 NOTE — Progress Notes (Signed)
Family Medicine Teaching Service Daily Progress Note Intern Pager: [phone]  Patient name: Ashley Moran Medical record number: [id number] Date of birth: Mar 12, 1951 Age: 69 y.o. Gender: female  Primary Care Provider: Patient, No Pcp Per Consultants: CCM Code Status: Full   Pt Overview and Major Events to Date:  Admitted to FPTS 12/30 Transferred to CCM ON on 12/30, FPTS primary  Assessment and Plan: Ashley Moran is a 69 y.o. female presenting with shortness of breath and abdominal pain, found to have Covid pneumonia. PMH is significant for CAD with history of RCA PCI, PVD, HTN, HLD.  Acute hypoxic respiratory failure 2/2 COVID-19 pneumonia Patient's breathing subjectively worse today. Saturated at 88-94% on 15L NRB on today's exam but with RLL crackles and with desats to 70s with eating, remains on antibiotic and antiviral therapy. VQ scan negative for PE. D dimer stable at 1.2. -Airborne and contact precautions due to Covid infection -Supplemental oxygen with goal O2 saturation >92%, wean as tolerated -CCM consulted, appreciate recommendations  -continue remdesivir (12/30-) and dexamethasone (12/30-) -urine strep, urine legionella, RVP -Trend D-dimer, CRP, and LDH daily -NS @125c /hr -continue azithromycin & CTX (12/30-)  - will recheck early pm  Hypotension with h/o HTN BP remains stable at 139/68. Home meds: carvedilol, HCTZ, lisinopril -hold Home meds  CAD & PAD with h/o of stent placement Seen by [facility], Dr. [facility].  Last visit on 12/15 at which time there were no medication changes.  Patient with recent angiography in November showing mild iliac disease bilaterally, on the right side there is moderate stenosis in the common femoral artery with mild to moderate diffuse SFA disease and three-vessel runoff below the knee.  On the left side there was a long occlusion of the SFA and re-constitution distally. No pain today, LE doppler neg for DVT. Home meds: carvedilol,  plavix, atrovastatin   -continue home meds  -tylenol PRN   Nausea Improved.  - zofran prn, watch QTc  Transaminitis Continues to uptrend.  -monitor on daily CMP, will need to monitor closely given remdesivir use    AKI, improving Creatinine 1.8>1.1, almost back to baseline of 1.0. Likely component of dehydration as patient reports poor PO intake -NS@100cc /hr -monitor on daily CMP  Hyperglycemia A1C 11.9 on 12/30. No h/o diabetes in the past, likely 2/2 steroids. CBG 146 this am with 32u aspart delivered last 24 hours. Continues on 12u daily levemir.  - rSSI q4h - increase levemir to BID dosing  HLD Home meds include atorvastatin. -Continue home meds.  FEN/GI: heart healthy Prophylaxis: heparin   Disposition: continued inpatient stay   Subjective:  Breathing subjectively worse today.   Objective: Temp:  [98 F (36.7 C)-99.1 F (37.3 C)] 98 F (36.7 C) (01/01 0349) Pulse Rate:  [59-103] 69 (01/01 0600) Resp:  [20-36] 23 (01/01 0600) BP: (90-139)/(37-113) 132/55 (01/01 0600) SpO2:  [59 %-100 %] 91 % (01/01 0600) Physical Exam: General: lying in bed on R side Cardiovascular: RRR, no murmur Respiratory: RLL crackles, tachypneic to 30s, saturated 88-94% on 15L NRB but desats with movement.  Abdomen: soft, not TTP.  Extremities: no LE edema  Laboratory: Recent Labs  Lab 2019/03/31 0851 03-31-2019 2220 03/29/19 0200 03/30/19 0015  WBC 9.5  --  6.4 13.1*  HGB 11.7* 11.1* 11.0* 10.2*  HCT 37.6 36.2 36.2 32.4*  PLT 481*  --  424* 470*   Recent Labs  Lab March 31, 2019 1000 March 31, 2019 2202 03/29/19 0200 03/30/19 0015  NA  --  135 139 141  K  --  5.7* 5.0 6.1*  CL  --  108 109 114*  CO2  --  16* 17* 20*  BUN  --  52* 50* 43*  CREATININE  --  1.47* 1.41* 1.10*  CALCIUM  --  7.7* 8.5* 8.0*  PROT 7.4  --  6.1* 5.6*  BILITOT 1.0  --  0.6 0.4  ALKPHOS 63  --  63 101  ALT 146*  --  121* 365*  AST 160*  --  134* 508*  GLUCOSE  --  306* 276* 179*      Imaging/Diagnostic Tests: VAS [place] LOWER EXTREMITY VENOUS (DVT)  Result Date: 03/30/2019  Lower Venous Study Indications: COVID-19 positive. Acute respiratory failure.  Limitations: Body habitus, poor ultrasound/tissue interface and Suboptimal patient position, patient movement secondary to pain. Comparison Study: No prior study. Performing Technologist: Gertie Fey MHA, RDMS, RVT, RDCS  Examination Guidelines: A complete evaluation includes B-mode imaging, spectral Doppler, color Doppler, and power Doppler as needed of all accessible portions of each vessel. Bilateral testing is considered an integral part of a complete examination. Limited examinations for reoccurring indications may be performed as noted.  +---------+---------------+---------+-----------+----------+--------------+ RIGHT    CompressibilityPhasicitySpontaneityPropertiesThrombus Aging +---------+---------------+---------+-----------+----------+--------------+ CFV                                                   Not visualized +---------+---------------+---------+-----------+----------+--------------+ SFJ                                                   Not visualized +---------+---------------+---------+-----------+----------+--------------+ FV Prox  Full                                                        +---------+---------------+---------+-----------+----------+--------------+ FV Mid   Full                                                        +---------+---------------+---------+-----------+----------+--------------+ FV DistalFull                                                        +---------+---------------+---------+-----------+----------+--------------+ PFV                                                   Not visualized +---------+---------------+---------+-----------+----------+--------------+ POP      Full           Yes      Yes                                  +---------+---------------+---------+-----------+----------+--------------+  PTV      Full                                                        +---------+---------------+---------+-----------+----------+--------------+ PERO                                                  Not visualized +---------+---------------+---------+-----------+----------+--------------+   +---------+---------------+---------+-----------+----------+--------------+ LEFT     CompressibilityPhasicitySpontaneityPropertiesThrombus Aging +---------+---------------+---------+-----------+----------+--------------+ CFV                                                   Not visualized +---------+---------------+---------+-----------+----------+--------------+ SFJ                                                   Not visualized +---------+---------------+---------+-----------+----------+--------------+ FV Prox                                               Not visualized +---------+---------------+---------+-----------+----------+--------------+ FV Mid                                                Not visualized +---------+---------------+---------+-----------+----------+--------------+ FV Distal                                             Not visualized +---------+---------------+---------+-----------+----------+--------------+ PFV                                                   Not visualized +---------+---------------+---------+-----------+----------+--------------+ POP      Full           Yes      Yes                                 +---------+---------------+---------+-----------+----------+--------------+ PTV      Full                                                        +---------+---------------+---------+-----------+----------+--------------+ PERO     Full                                                         +---------+---------------+---------+-----------+----------+--------------+  Summary: Right: There is no evidence of deep vein thrombosis in the lower extremity. However, portions of this examination were limited- see technologist comments above. No cystic structure found in the popliteal fossa. Left: There is no evidence of deep vein thrombosis in the lower extremity. However, portions of this examination were limited- see technologist comments above. No cystic structure found in the popliteal fossa.  *See table(s) above for measurements and observations. Electronically signed by Waverly Ferrari MD on 03/30/2019 at 6:26:32 AM.    Final     Ellwood Dense, DO 03/30/2019, 7:00 AM PGY-3, [facility] Family Medicine FPTS Intern pager: [phone], text pages welcome

## 2019-03-30 NOTE — Progress Notes (Signed)
FPTS Interim Progress Note  Examined Ashley Moran at bedside.  Patient is lying on her left side and is tachypneic with a RR in the high 20s, but she is conversant and says that she feels good and has no complaints.  She denies any fatigue.  She is currently satting 80% on high flow nasal cannula via nonrebreather mask at 20 L/min of 100% O2.  We will continue to watch closely overnight, hopeful to avoid intubation however concern for this is significant given patient's persistent low pulse oximetry.  [name] C. Frances Furbish, MD PGY-3, [facility] 03/30/2019 8:47 PM

## 2019-03-30 NOTE — Progress Notes (Signed)
FPTS Interim Progress Note  Examined patient at bedside, lying on L side. Now on HHFNC with NRB at 20L with 100% FiO2.  Still with respiratory rate in the 30s, sats 80-90% throughout the afternoon. At earlier afternoon check, mentating appropriately and resting at evening check. Plan to continue current management but will continue to monitor closely and hopeful to avoid intubation but status is certainly tenuous. Daughter updated on status and appreciative of care.   Ellwood Dense, DO 03/30/2019, 6:03 PM PGY-3, [facility] Family Medicine Service pager [phone]

## 2019-03-30 DEATH — deceased

## 2019-03-31 ENCOUNTER — Inpatient Hospital Stay (HOSPITAL_COMMUNITY): Payer: BC Managed Care – PPO

## 2019-03-31 LAB — CBC WITH DIFFERENTIAL/PLATELET
Abs Immature Granulocytes: 0.13 10*3/uL — ABNORMAL HIGH (ref 0.00–0.07)
Basophils Absolute: 0 10*3/uL (ref 0.0–0.1)
Basophils Relative: 0 %
Eosinophils Absolute: 0 10*3/uL (ref 0.0–0.5)
Eosinophils Relative: 0 %
HCT: 31.3 % — ABNORMAL LOW (ref 36.0–46.0)
Hemoglobin: 9.7 g/dL — ABNORMAL LOW (ref 12.0–15.0)
Immature Granulocytes: 1 %
Lymphocytes Relative: 3 %
Lymphs Abs: 0.4 10*3/uL — ABNORMAL LOW (ref 0.7–4.0)
MCH: 21.7 pg — ABNORMAL LOW (ref 26.0–34.0)
MCHC: 31 g/dL (ref 30.0–36.0)
MCV: 70.2 fL — ABNORMAL LOW (ref 80.0–100.0)
Monocytes Absolute: 0.4 10*3/uL (ref 0.1–1.0)
Monocytes Relative: 3 %
Neutro Abs: 13 10*3/uL — ABNORMAL HIGH (ref 1.7–7.7)
Neutrophils Relative %: 93 %
Platelets: 487 10*3/uL — ABNORMAL HIGH (ref 150–400)
RBC: 4.46 MIL/uL (ref 3.87–5.11)
RDW: 14.6 % (ref 11.5–15.5)
WBC: 13.9 10*3/uL — ABNORMAL HIGH (ref 4.0–10.5)
nRBC: 0.4 % — ABNORMAL HIGH (ref 0.0–0.2)

## 2019-03-31 LAB — POCT I-STAT 7, (LYTES, BLD GAS, ICA,H+H)
Acid-Base Excess: 5 mmol/L — ABNORMAL HIGH (ref 0.0–2.0)
Acid-base deficit: 1 mmol/L (ref 0.0–2.0)
Bicarbonate: 29.8 mmol/L — ABNORMAL HIGH (ref 20.0–28.0)
Bicarbonate: 34 mmol/L — ABNORMAL HIGH (ref 20.0–28.0)
Calcium, Ion: 1.2 mmol/L (ref 1.15–1.40)
Calcium, Ion: 1.22 mmol/L (ref 1.15–1.40)
HCT: 35 % — ABNORMAL LOW (ref 36.0–46.0)
HCT: 33 % — ABNORMAL LOW (ref 36.0–46.0)
Hemoglobin: 11.9 g/dL — ABNORMAL LOW (ref 12.0–15.0)
Hemoglobin: 11.2 g/dL — ABNORMAL LOW (ref 12.0–15.0)
O2 Saturation: 60 %
O2 Saturation: 88 %
Patient temperature: 96.9
Patient temperature: 97.7
Potassium: 5.6 mmol/L — ABNORMAL HIGH (ref 3.5–5.1)
Potassium: 6.2 mmol/L — ABNORMAL HIGH (ref 3.5–5.1)
Sodium: 144 mmol/L (ref 135–145)
Sodium: 143 mmol/L (ref 135–145)
TCO2: 32 mmol/L (ref 22–32)
TCO2: 36 mmol/L — ABNORMAL HIGH (ref 22–32)
pCO2 arterial: 79.9 mmHg (ref 32.0–48.0)
pCO2 arterial: 74.1 mmHg (ref 32.0–48.0)
pH, Arterial: 7.175 — CL (ref 7.350–7.450)
pH, Arterial: 7.267 — ABNORMAL LOW (ref 7.350–7.450)
pO2, Arterial: 38 mmHg — CL (ref 83.0–108.0)
pO2, Arterial: 63 mmHg — ABNORMAL LOW (ref 83.0–108.0)

## 2019-03-31 LAB — COMPREHENSIVE METABOLIC PANEL
ALT: 304 U/L — ABNORMAL HIGH (ref 0–44)
AST: 135 U/L — ABNORMAL HIGH (ref 15–41)
Albumin: 2.2 g/dL — ABNORMAL LOW (ref 3.5–5.0)
Alkaline Phosphatase: 139 U/L — ABNORMAL HIGH (ref 38–126)
Anion gap: 8 (ref 5–15)
BUN: 30 mg/dL — ABNORMAL HIGH (ref 8–23)
CO2: 25 mmol/L (ref 22–32)
Calcium: 8.4 mg/dL — ABNORMAL LOW (ref 8.9–10.3)
Chloride: 109 mmol/L (ref 98–111)
Creatinine, Ser: 1.2 mg/dL — ABNORMAL HIGH (ref 0.44–1.00)
GFR calc Af Amer: 54 mL/min — ABNORMAL LOW (ref >60)
GFR calc non Af Amer: 47 mL/min — ABNORMAL LOW (ref >60)
Glucose, Bld: 169 mg/dL — ABNORMAL HIGH (ref 70–99)
Potassium: 5.7 mmol/L — ABNORMAL HIGH (ref 3.5–5.1)
Sodium: 142 mmol/L (ref 135–145)
Total Bilirubin: 0.4 mg/dL (ref 0.3–1.2)
Total Protein: 5.5 g/dL — ABNORMAL LOW (ref 6.5–8.1)

## 2019-03-31 LAB — PROTIME-INR
INR: 1.2 (ref 0.8–1.2)
Prothrombin Time: 14.9 seconds (ref 11.4–15.2)

## 2019-03-31 LAB — GLUCOSE, CAPILLARY
Glucose-Capillary: 155 mg/dL — ABNORMAL HIGH (ref 70–99)
Glucose-Capillary: 149 mg/dL — ABNORMAL HIGH (ref 70–99)
Glucose-Capillary: 159 mg/dL — ABNORMAL HIGH (ref 70–99)
Glucose-Capillary: 243 mg/dL — ABNORMAL HIGH (ref 70–99)
Glucose-Capillary: 283 mg/dL — ABNORMAL HIGH (ref 70–99)
Glucose-Capillary: 226 mg/dL — ABNORMAL HIGH (ref 70–99)

## 2019-03-31 LAB — LACTATE DEHYDROGENASE: LDH: 556 U/L — ABNORMAL HIGH (ref 98–192)

## 2019-03-31 LAB — BASIC METABOLIC PANEL WITH GFR
Anion gap: 5 (ref 5–15)
BUN: 30 mg/dL — ABNORMAL HIGH (ref 8–23)
CO2: 30 mmol/L (ref 22–32)
Calcium: 8 mg/dL — ABNORMAL LOW (ref 8.9–10.3)
Chloride: 108 mmol/L (ref 98–111)
Creatinine, Ser: 1.4 mg/dL — ABNORMAL HIGH (ref 0.44–1.00)
GFR calc Af Amer: 45 mL/min — ABNORMAL LOW
GFR calc non Af Amer: 39 mL/min — ABNORMAL LOW
Glucose, Bld: 326 mg/dL — ABNORMAL HIGH (ref 70–99)
Potassium: 5.3 mmol/L — ABNORMAL HIGH (ref 3.5–5.1)
Sodium: 143 mmol/L (ref 135–145)

## 2019-03-31 LAB — PROCALCITONIN: Procalcitonin: 74.74 ng/mL

## 2019-03-31 LAB — C-REACTIVE PROTEIN: CRP: 4.3 mg/dL — ABNORMAL HIGH (ref <1.0)

## 2019-03-31 LAB — MAGNESIUM: Magnesium: 2.2 mg/dL (ref 1.7–2.4)

## 2019-03-31 LAB — PHOSPHORUS: Phosphorus: 2.6 mg/dL (ref 2.5–4.6)

## 2019-03-31 LAB — D-DIMER, QUANTITATIVE: D-Dimer, Quant: 1.23 ug/mL-FEU — ABNORMAL HIGH (ref 0.00–0.50)

## 2019-03-31 MED ORDER — ROCURONIUM BROMIDE 10 MG/ML (PF) SYRINGE
PREFILLED_SYRINGE | INTRAVENOUS | Status: AC
  Administered 2019-03-31: 50 mg
  Filled 2019-03-31: qty 10

## 2019-03-31 MED ORDER — FENTANYL CITRATE (PF) 100 MCG/2ML IJ SOLN: 25.0000 ug | Freq: Once | INTRAMUSCULAR | Status: AC

## 2019-03-31 MED ORDER — MIDAZOLAM HCL 2 MG/2ML IJ SOLN: 2.0000 mg | INTRAMUSCULAR | Status: DC | PRN

## 2019-03-31 MED ORDER — FENTANYL BOLUS VIA INFUSION
25.0000 ug | INTRAVENOUS | Status: DC | PRN
  Administered 2019-04-01 – 2019-04-07 (×22): 25 ug via INTRAVENOUS
  Filled 2019-03-31: qty 25

## 2019-03-31 MED ORDER — MIDAZOLAM HCL 2 MG/2ML IJ SOLN: 1.0000 mg | INTRAMUSCULAR | Status: DC | PRN

## 2019-03-31 MED ORDER — MIDAZOLAM HCL 2 MG/2ML IJ SOLN
2.0000 mg | Freq: Once | INTRAMUSCULAR | Status: AC
  Administered 2019-03-31: 2 mg via INTRAVENOUS

## 2019-03-31 MED ORDER — FENTANYL CITRATE (PF) 100 MCG/2ML IJ SOLN: 25.0000 ug | Freq: Once | INTRAMUSCULAR | Status: DC

## 2019-03-31 MED ORDER — ARTIFICIAL TEARS OPHTHALMIC OINT
1.0000 "application " | TOPICAL_OINTMENT | Freq: Three times a day (TID) | OPHTHALMIC | Status: DC
  Administered 2019-03-31 – 2019-04-07 (×18): 1 via OPHTHALMIC
  Filled 2019-03-31 (×2): qty 3.5

## 2019-03-31 MED ORDER — FUROSEMIDE 10 MG/ML IJ SOLN
20.0000 mg | Freq: Once | INTRAMUSCULAR | Status: AC
  Administered 2019-03-31: 20 mg via INTRAVENOUS
  Filled 2019-03-31: qty 2

## 2019-03-31 MED ORDER — CLOPIDOGREL BISULFATE 75 MG PO TABS
75.0000 mg | ORAL_TABLET | Freq: Every day | ORAL | Status: DC
  Administered 2019-03-31 – 2019-04-09 (×10): 75 mg
  Filled 2019-03-31 (×10): qty 1

## 2019-03-31 MED ORDER — FENTANYL CITRATE (PF) 100 MCG/2ML IJ SOLN: 25.0000 ug | INTRAMUSCULAR | Status: DC | PRN

## 2019-03-31 MED ORDER — ACETAMINOPHEN 500 MG PO TABS: 500.0000 mg | ORAL_TABLET | Freq: Three times a day (TID) | ORAL | Status: DC | PRN

## 2019-03-31 MED ORDER — LACTATED RINGERS IV BOLUS: 500.0000 mL | Freq: Once | INTRAVENOUS | Status: DC

## 2019-03-31 MED ORDER — SODIUM ZIRCONIUM CYCLOSILICATE 10 G PO PACK
10.0000 g | PACK | Freq: Once | ORAL | Status: AC
  Administered 2019-03-31: 10 g via ORAL
  Filled 2019-03-31: qty 1

## 2019-03-31 MED ORDER — EPINEPHRINE 1 MG/10ML IJ SOSY: 1.0000 | PREFILLED_SYRINGE | Freq: Once | INTRAMUSCULAR | Status: AC

## 2019-03-31 MED ORDER — PROPOFOL 1000 MG/100ML IV EMUL
0.0000 ug/kg/min | INTRAVENOUS | Status: DC
  Administered 2019-03-31: 5 ug/kg/min via INTRAVENOUS
  Filled 2019-03-31: qty 100

## 2019-03-31 MED ORDER — PHENYLEPHRINE HCL-NACL 10-0.9 MG/250ML-% IV SOLN
0.0000 ug/min | INTRAVENOUS | Status: DC
  Administered 2019-03-31: 60 ug/min via INTRAVENOUS
  Administered 2019-04-01: 40 ug/min via INTRAVENOUS
  Administered 2019-04-01: 55 ug/min via INTRAVENOUS
  Administered 2019-04-01: 50 ug/min via INTRAVENOUS
  Administered 2019-04-01: 40 ug/min via INTRAVENOUS
  Administered 2019-04-01: 20 ug/min via INTRAVENOUS
  Administered 2019-04-02: 02:00:00 25 ug/min via INTRAVENOUS
  Administered 2019-04-03: 01:00:00 15 ug/min via INTRAVENOUS
  Administered 2019-04-04: 20:00:00 20 ug/min via INTRAVENOUS
  Filled 2019-03-31 (×3): qty 250
  Filled 2019-03-31: qty 500
  Filled 2019-03-31 (×4): qty 250

## 2019-03-31 MED ORDER — POLYETHYLENE GLYCOL 3350 17 G PO PACK: 17.0000 g | PACK | Freq: Every day | ORAL | Status: DC | PRN

## 2019-03-31 MED ORDER — FENTANYL CITRATE (PF) 100 MCG/2ML IJ SOLN
50.0000 ug | INTRAMUSCULAR | Status: DC | PRN
  Administered 2019-03-31: 200 ug via INTRAVENOUS
  Filled 2019-03-31: qty 4

## 2019-03-31 MED ORDER — FENTANYL 2500MCG IN NS 250ML (10MCG/ML) PREMIX INFUSION
25.0000 ug/h | INTRAVENOUS | Status: DC
  Administered 2019-03-31: 25 ug/h via INTRAVENOUS
  Filled 2019-03-31: qty 250

## 2019-03-31 MED ORDER — DOCUSATE SODIUM 50 MG/5ML PO LIQD: 100.0000 mg | Freq: Two times a day (BID) | ORAL | Status: DC | PRN

## 2019-03-31 MED ORDER — ETOMIDATE 2 MG/ML IV SOLN
20.0000 mg | Freq: Once | INTRAVENOUS | Status: AC
  Administered 2019-03-31: 20 mg via INTRAVENOUS

## 2019-03-31 MED ORDER — ACETAMINOPHEN 160 MG/5ML PO SOLN: 500.0000 mg | Freq: Three times a day (TID) | ORAL | Status: DC | PRN

## 2019-03-31 MED ORDER — KETAMINE HCL 50 MG/5ML IJ SOSY
0.5000 mg/kg | PREFILLED_SYRINGE | Freq: Once | INTRAMUSCULAR | Status: AC
  Administered 2019-04-01: 31 mg via INTRAVENOUS

## 2019-03-31 MED ORDER — FENTANYL CITRATE (PF) 100 MCG/2ML IJ SOLN
INTRAMUSCULAR | Status: AC
  Administered 2019-03-31: 100 ug via INTRAVENOUS
  Filled 2019-03-31: qty 2

## 2019-03-31 MED ORDER — MIDAZOLAM HCL 2 MG/2ML IJ SOLN
2.0000 mg | INTRAMUSCULAR | Status: DC | PRN
  Filled 2019-03-31: qty 2

## 2019-03-31 MED ORDER — CISATRACURIUM BOLUS VIA INFUSION
5.0000 mg | Freq: Once | INTRAVENOUS | Status: AC
  Administered 2019-04-01: 5 mg via INTRAVENOUS
  Filled 2019-03-31: qty 5

## 2019-03-31 MED ORDER — ROCURONIUM BROMIDE 50 MG/5ML IV SOLN
50.0000 mg | Freq: Once | INTRAVENOUS | Status: AC
  Administered 2019-03-31: 50 mg via INTRAVENOUS
  Filled 2019-03-31: qty 5

## 2019-03-31 MED ORDER — MIDAZOLAM 50MG/50ML (1MG/ML) PREMIX INFUSION
2.0000 mg/h | INTRAVENOUS | Status: DC
  Administered 2019-03-31: 2 mg/h via INTRAVENOUS
  Administered 2019-04-01 (×3): 8 mg/h via INTRAVENOUS
  Administered 2019-04-01: 10 mg/h via INTRAVENOUS
  Administered 2019-04-02 – 2019-04-04 (×11): 8 mg/h via INTRAVENOUS
  Administered 2019-04-05: 14:00:00 10 mg/h via INTRAVENOUS
  Administered 2019-04-05 (×3): 8 mg/h via INTRAVENOUS
  Administered 2019-04-06 (×2): 10 mg/h via INTRAVENOUS
  Administered 2019-04-06 (×2): 8 mg/h via INTRAVENOUS
  Administered 2019-04-07: 10 mg/h via INTRAVENOUS
  Administered 2019-04-07: 9 mg/h via INTRAVENOUS
  Filled 2019-03-31 (×27): qty 50

## 2019-03-31 MED ORDER — FENTANYL CITRATE (PF) 100 MCG/2ML IJ SOLN: 100.0000 ug | Freq: Once | INTRAMUSCULAR | Status: AC

## 2019-03-31 MED ORDER — KETAMINE HCL 100 MG/10ML IJ SOSY
0.5000 mg/kg | PREFILLED_SYRINGE | Freq: Once | INTRAMUSCULAR | Status: DC
  Filled 2019-03-31: qty 10

## 2019-03-31 MED ORDER — PHENYLEPHRINE HCL-NACL 10-0.9 MG/250ML-% IV SOLN
INTRAVENOUS | Status: AC
  Administered 2019-03-31: 100 ug/min via INTRAVENOUS
  Filled 2019-03-31: qty 250

## 2019-03-31 MED ORDER — PHENYLEPHRINE HCL-NACL 10-0.9 MG/250ML-% IV SOLN
INTRAVENOUS | Status: AC
  Filled 2019-03-31: qty 250

## 2019-03-31 MED ORDER — MIDAZOLAM BOLUS VIA INFUSION
1.0000 mg | INTRAVENOUS | Status: DC | PRN
  Administered 2019-04-02: 08:00:00 2 mg via INTRAVENOUS
  Administered 2019-04-02: 20:00:00 1 mg via INTRAVENOUS
  Administered 2019-04-05 – 2019-04-07 (×7): 2 mg via INTRAVENOUS
  Filled 2019-03-31: qty 2

## 2019-03-31 MED ORDER — EPINEPHRINE 1 MG/10ML IJ SOSY
PREFILLED_SYRINGE | INTRAMUSCULAR | Status: AC
  Administered 2019-03-31: 1 mg
  Filled 2019-03-31: qty 10

## 2019-03-31 MED ORDER — MIDAZOLAM HCL 2 MG/2ML IJ SOLN
INTRAMUSCULAR | Status: AC
  Administered 2019-03-31: 2 mg via INTRAVENOUS
  Filled 2019-03-31: qty 2

## 2019-03-31 MED ORDER — KETAMINE HCL-SODIUM CHLORIDE 100-0.9 MG/10ML-% IV SOSY: 0.5000 mg/kg | PREFILLED_SYRINGE | Freq: Once | INTRAVENOUS | Status: DC

## 2019-03-31 MED ORDER — EPINEPHRINE 1 MG/10ML IJ SOSY
PREFILLED_SYRINGE | INTRAMUSCULAR | Status: AC
  Administered 2019-03-31: 1 mg via INTRAVENOUS
  Filled 2019-03-31: qty 10

## 2019-03-31 MED ORDER — DEXAMETHASONE 4 MG PO TABS
6.0000 mg | ORAL_TABLET | ORAL | Status: AC
  Administered 2019-03-31 – 2019-04-06 (×7): 6 mg
  Filled 2019-03-31 (×7): qty 2

## 2019-03-31 MED ORDER — MIDAZOLAM HCL 2 MG/2ML IJ SOLN
1.0000 mg | INTRAMUSCULAR | Status: DC | PRN
  Filled 2019-03-31 (×2): qty 2

## 2019-03-31 MED ORDER — SODIUM BICARBONATE 8.4 % IV SOLN
INTRAVENOUS | Status: AC
  Administered 2019-03-31: 50 meq
  Filled 2019-03-31: qty 50

## 2019-03-31 MED ORDER — SODIUM CHLORIDE 0.9 % IV SOLN
0.0000 ug/kg/min | INTRAVENOUS | Status: DC
  Administered 2019-04-01: 3 ug/kg/min via INTRAVENOUS
  Administered 2019-04-02 – 2019-04-03 (×2): 1.5 ug/kg/min via INTRAVENOUS
  Filled 2019-03-31 (×4): qty 20

## 2019-03-31 MED ORDER — PANTOPRAZOLE SODIUM 40 MG PO PACK
40.0000 mg | PACK | Freq: Every day | ORAL | Status: DC
  Administered 2019-03-31: 40 mg
  Filled 2019-03-31: qty 20

## 2019-03-31 MED ORDER — CHLORHEXIDINE GLUCONATE 0.12% ORAL RINSE (MEDLINE KIT)
15.0000 mL | Freq: Two times a day (BID) | OROMUCOSAL | Status: DC
  Administered 2019-03-31 – 2019-04-10 (×21): 15 mL via OROMUCOSAL

## 2019-03-31 MED ORDER — ORAL CARE MOUTH RINSE
15.0000 mL | OROMUCOSAL | Status: DC
  Administered 2019-03-31 – 2019-04-10 (×94): 15 mL via OROMUCOSAL

## 2019-03-31 MED ORDER — CLONAZEPAM 1 MG PO TABS
1.0000 mg | ORAL_TABLET | Freq: Two times a day (BID) | ORAL | Status: DC
  Administered 2019-03-31: 1 mg
  Filled 2019-03-31: qty 1

## 2019-03-31 MED ORDER — FENTANYL CITRATE (PF) 100 MCG/2ML IJ SOLN: 50.0000 ug | INTRAMUSCULAR | Status: DC | PRN

## 2019-03-31 MED ORDER — PIPERACILLIN-TAZOBACTAM 3.375 G IVPB
3.3750 g | Freq: Three times a day (TID) | INTRAVENOUS | Status: AC
  Administered 2019-03-31 – 2019-04-06 (×21): 3.375 g via INTRAVENOUS
  Filled 2019-03-31 (×23): qty 50

## 2019-03-31 MED ORDER — PANTOPRAZOLE SODIUM 40 MG IV SOLR
40.0000 mg | Freq: Every day | INTRAVENOUS | Status: DC
  Administered 2019-03-31 – 2019-04-05 (×6): 40 mg via INTRAVENOUS
  Filled 2019-03-31 (×6): qty 40

## 2019-03-31 MED ORDER — VANCOMYCIN HCL 750 MG/150ML IV SOLN
750.0000 mg | INTRAVENOUS | Status: DC
  Administered 2019-03-31 – 2019-04-02 (×3): 750 mg via INTRAVENOUS
  Filled 2019-03-31 (×3): qty 150

## 2019-03-31 MED ORDER — FENTANYL 2500MCG IN NS 250ML (10MCG/ML) PREMIX INFUSION
0.0000 ug/h | INTRAVENOUS | Status: DC
  Administered 2019-04-01 – 2019-04-03 (×7): 250 ug/h via INTRAVENOUS
  Administered 2019-04-04: 02:00:00 200 ug/h via INTRAVENOUS
  Administered 2019-04-04: 13:00:00 225 ug/h via INTRAVENOUS
  Administered 2019-04-05: 11:00:00 300 ug/h via INTRAVENOUS
  Administered 2019-04-05: 01:00:00 225 ug/h via INTRAVENOUS
  Administered 2019-04-06: 175 ug/h via INTRAVENOUS
  Administered 2019-04-06: 200 ug/h via INTRAVENOUS
  Administered 2019-04-07: 275 ug/h via INTRAVENOUS
  Administered 2019-04-07: 350 ug/h via INTRAVENOUS
  Filled 2019-03-31 (×14): qty 250

## 2019-03-31 MED ORDER — FENTANYL BOLUS VIA INFUSION
25.0000 ug | INTRAVENOUS | Status: DC | PRN
  Filled 2019-03-31: qty 25

## 2019-03-31 NOTE — Procedures (Signed)
Intubation Procedure Note Ashley Moran [id number] 09-10-1950  Procedure: Intubation Indications: Respiratory insufficiency  Procedure Details Consent: Risks of procedure as well as the alternatives and risks of each were explained to the (patient/caregiver).  Consent for procedure obtained. Time Out: Verified patient identification, verified procedure, site/side was marked, verified correct patient position, special equipment/implants available, medications/allergies/relevent history reviewed, required imaging and test results available.  Performed  Maximum sterile technique was used including cap, gloves, gown, hand hygiene, mask, sheet and box could not be placed.  MAC  Is a difficult high risk intubation because of COVID-19.  Patient was profoundly hypoxemic saturating 50% on both facemask and heated high flow nasal cannula.  Patient was made supine given fentanyl, Versed, etomidate and rocuronium.  The box could not be placed because of tightness of space/limited ports.  At first glide scope with a blade size 4 was used after removing the lower dentures.  During this time right upper incisor broke loose and fell into the oral cavity this was removed.  The epiglottis and the vallecula could not be seen.  Quickly the patient became profoundly hypotensive and bradycardic.  Patient was resuscitated with epinephrine bolus and also bicarb bolus.  Neo-Synephrine was started.  Patient never lost pulse.  She is back for a total of approximately 5-10 minutes.  At this point in time size 3 blade was used in the epiglottis and the vocal cords could be seen easily.  Patient was then intubated.  Oral consent from the daughter was obtained preprocedure.  Patient daughter was warned that the patient could die during the procedure because of the high risk.  Evaluation Hemodynamic Status: hypotneisve and bradycaric and needed to be given epi, bic gtt and started on neo during bagging and preintubationi; O2 sats:  low due to coovid Patient's Current Condition: unstable - pre and post due to covid Complications: No apparent complications Patient did not tolerate procedure well.du eto covid  Chest X-ray ordered to verify placement.  CXR: pending.    SIGNATURE    Dr. Brand Males, M.D., F.C.C.P,  Pulmonary and Critical Care Medicine Staff Physician, [place] Director - Interstitial Lung Disease  Program  Pulmonary [place] at [place], [place], [address]  Pager: [phone], If no answer or between  15:00h - 7:00h: call [phone] Telephone: [phone]  1:26 PM 03/31/2019

## 2019-03-31 NOTE — Procedures (Signed)
Arterial Catheter Insertion Procedure Note Najiyah Paris [id number] [birth year]  Procedure: Insertion of Arterial Catheter  Indications: Blood pressure monitoring  Procedure Details Consent: Unable to obtain consent because of emergent medical necessity. Time Out: Verified patient identification, verified procedure, site/side was marked, verified correct patient position, special equipment/implants available, medications/allergies/relevent history reviewed, required imaging and test results available.  Performed  Maximum sterile technique was used including antiseptics. Skin prep: Chlorhexidine; local anesthetic administered 20 gauge catheter was inserted into left radial artery using the Seldinger technique. ULTRASOUND GUIDANCE USED: NO Evaluation Blood flow good; BP tracing good. Complications: No apparent complications.   Ronny Flurry 03/31/2019

## 2019-03-31 NOTE — Progress Notes (Signed)
Pharmacy Antibiotic Note  Ashley Moran is a 69 y.o. female admitted on April 01, 2019 with hypoxia secondary to COVID-19.  Pharmacy has been consulted for vancomycin and zosyn dosing. She has been on ceftriaxone, azithromycin for concern over bacterial PNA in addition to COVID-19, and now has developed increasing SOB and hypoxemia.  Plan: Zosyn 3.375g IV q8h (4 hour infusion).  Vancomycin 750 mg q24 hr  UOP 0.9-1.2 mL/kg/hr over past 2 days, Scr 1.2 around BL, t 1/2 23 hours, expected AUC 553 using these number, I suspect she will clear well given her current UOP so will go with q24h dosing for now. Also will order a MRSA PCR to try and facilitate de-escalation   Weight: 137 lb 9.1 oz (62.4 kg)  Temp (24hrs), Avg:98.4 F (36.9 C), Min:97 F (36.1 C), Max:99.6 F (37.6 C)  Recent Labs  Lab 01-Apr-2019 0851 2019-04-01 1644 04-01-2019 2202 03/29/19 0200 03/29/19 0212 03/29/19 0725 03/30/19 0015 03/30/19 0655 03/30/19 1650 03/31/19 0413  WBC 9.5  --   --  6.4  --   --  13.1*  --   --  13.9*  CREATININE 1.80*  --  1.47* 1.41*  --   --  1.10* 1.05* 1.31* 1.20*  LATICACIDVEN  --  2.2* 1.8  --  2.6* 1.8 1.6  --   --   --     Estimated Creatinine Clearance: 36.6 mL/min (A) (by C-G formula based on SCr of 1.2 mg/dL (H)).    No Known Allergies  Antimicrobials this admission: Azith 12/30 >> 1/2 CTX 12/30 >> 1/2 Vancomycin 1/2 >> Zosyn 1/2 >>   Microbiology results: 12/30 BCx: coag neg staph 1/4  1/2 MRSA PCR: ordered  Thank you for allowing pharmacy to be a part of this patient's care.  Jeannetta Nap 03/31/2019 8:56 AM

## 2019-03-31 NOTE — Progress Notes (Signed)
   Critical care evening rounds   -Patient seen at the bedside. At this point in time she status post central line left IJ. Chest x-ray shows it can be used. She is also intubated. ET tube looks good in position may be just 1 cm above the carina. Pulmonary infiltrates look a little bit better. She is on FiO2 100% and PEEP of 10. She is on no sedation but a RASS sedation score is -2.  Blood gas in the evening to a few hours ago showed severe respiratory acidosis but at that time she was unstable according to the nurse.  Plan -Start propofol continue sedation with fentanyl and Versed as needed -Recheck blood gas -If PF ratio is less than 150 consider Nimbex plus minus prone ventilation possibly tonight or tomorrow plus minus  -Awaiting respiratory tracheal aspirate to rule out superimposed bacterial infection      SIGNATURE    Dr. Kalman Shan, M.D., F.C.C.P,  Pulmonary and Critical Care Medicine Staff Physician, [facility] Director - Interstitial Lung Disease  Program  Pulmonary Fibrosis [facility] at [facility] [place], [place], [address]  Pager: [phone], If no answer or between  15:00h - 7:00h: call [phone] Telephone: [phone]  6:52 PM 03/31/2019

## 2019-03-31 NOTE — Progress Notes (Signed)
RT NOTE: RT reported critical ABG values to Claris Pong, NP. ABG as follows: pH 7.17, PCO2 79.9, PO2 38 and HCO3 29.8. No new orders for RT at this time per NP. RT will continue to monitor.

## 2019-03-31 NOTE — Progress Notes (Signed)
Family Medicine Teaching Service Daily Progress Note Intern Pager: [phone]  Patient name: Ashley Moran Medical record number: [id number] Date of birth: 07-Nov-1950 Age: 69 y.o. Gender: female  Primary Care Provider: Patient, No Pcp Per Consultants: CCM Code Status: Full   Pt Overview and Major Events to Date:  Admitted to FPTS 12/30 Transferred to CCM ON on 12/30 12/31 FPTS primary Transferred to ICU 03/30/18  Assessment and Plan: Ashley Moran a 69 y.o.femalepresenting with shortness of breath and abdominal pain, found to haveCovid pneumonia. PMH is significant forCAD with history of RCA PCI, PVD, HTN, HLD.  Acute hypoxic respiratory failure 2/2 COVID-19 pneumonia Per chart review appears to be desatting to 70s on 20L NRB. Discussed with RN who confirms these are true desaturations that have been occurring ON as well. Patient appears to be more tired during exam. She is working more to breath and unable to speak full sentences. Unable to maintain O2 >81%. Per RN who had patient on 1/1, patient appears more tired today. I called Dr. Marchelle Gearing this AM who will see patient. Informed me to switch abx coverage to vanc/zosyn to broaden coverage. Currently on heated high flow with NRB. ICU plans to take over care of patient today, Dr. Marchelle Gearing has asked me to change attending and put in transfer orders. I have called patient's daugther Ashley Moran) to inform her of this update and explain COVID as well as likely need for intubation today. Daughter understands and had no questions.  -Airborne and contact precautions due to Covid infection -Supplemental oxygen with goal O2 saturation>92%, wean as tolerated -CCM consulted, appreciate recommendations  -continue remdesivir(12/30-)and dexamethasone(12/30-) -urine legionella, RVP pending -Trend D-dimer, CRP, and LDH daily -NS @125c /hr -discontinue azithromycin & CTX (12/30-1/2)  -change to vanc & zosyn per CCM (1/2-) -rest of care per ICU,  will resume care when patient is stable for transfer to floor   Hypotension with h/o HTN BP 149/69 this AM, remains stable. Home meds: carvedilol, HCTZ, lisinopril -hold Home meds  CAD & PAD with h/o of stent placement [facility] Denies LE pain today. Home meds: carvedilol, plavix, atrovastatin   -continue home meds  -tylenol PRN   Nausea Resolved. QTc 474 on 12/31 - zofran prn, watch QTc  Transaminitis Improved. AST 508>135, ALT 365>304.  -monitor on daily CMP, will need to monitor closely given remdesivir use   AKI, improving Improved. Cr 1.31>1.20. BL  of 1.0.Likely component of dehydration as patient reports poor PO intake -D5LR @50cc /h -monitor on daily CMP -will need to monitor closely now that patient is on vancomycin   Hyperglycemia AM CBG 155. A1C 11.9 on 12/30. No h/o diabetes in the past, likely 2/2 steroids.  - rSSI q4h - levemir BID   HLD Home meds include atorvastatin. -Continue home meds.  FEN/GI:heart healthy Prophylaxis:heparin  Disposition: ICU   Subjective:  Patient with worsening O2 requirement this a.m. as well as overnight.  While I was in room patient has desaturations to the 60s.  Patient states "I am good" but is unable to say a longer sentence.  Sentences are very short.  Attempted to prone with ICU attending in the room and patient continues to desat.  Discussed with Dr. who advised changing antibiotics and will plan to take over care of patient.  I was able to contact patient's daughter and updated her with these recommendations.  Informed daughter that patient has a high chance of intubation later today versus drying her hospital course.  Daughter expressed understanding and  would like updates from the RN as they come.  I have called from RN to inform them to update daughter.  Objective: Temp:  [97 F (36.1 C)-99.6 F (37.6 C)] 99.6 F (37.6 C) (01/02 0400) Pulse Rate:  [68-113] 113 (01/02  0823) Resp:  [23-37] 26 (01/02 0823) BP: (116-165)/(53-81) 149/69 (01/02 0700) SpO2:  [63 %-95 %] 63 % (01/02 0823) FiO2 (%):  [100 %] 100 % (01/02 0823) Physical Exam: General: awake and alert, laying on right side, NRB in place  Cardiovascular: RRR, no MRG  Respiratory: crackles at bases, increased WOB, non paradoxical breathing, speaking shortened sentences  Abdomen: soft, non tender, non distended, bowel sounds normal  Extremities: no edema, non tender   Laboratory: Recent Labs  Lab 03/29/19 0200 03/30/19 0015 03/31/19 0413  WBC 6.4 13.1* 13.9*  HGB 11.0* 10.2* 9.7*  HCT 36.2 32.4* 31.3*  PLT 424* 470* 487*   Recent Labs  Lab 03/29/19 0200 03/30/19 0015 03/30/19 0655 03/30/19 1650 03/31/19 0413  NA 139 141 143 140 142  K 5.0 6.1* 5.8* 5.1 5.7*  CL 109 114* 117* 112* 109  CO2 17* 20* 20* 20* 25  BUN 50* 43* 41* 38* 30*  CREATININE 1.41* 1.10* 1.05* 1.31* 1.20*  CALCIUM 8.5* 8.0* 7.9* 7.9* 8.4*  PROT 6.1* 5.6*  --   --  5.5*  BILITOT 0.6 0.4  --   --  0.4  ALKPHOS 63 101  --   --  139*  ALT 121* 365*  --   --  304*  AST 134* 508*  --   --  135*  GLUCOSE 276* 179* 165* 296* 169*     Imaging/Diagnostic Tests: No images   Caroline More, DO 03/31/2019, 8:32 AM PGY-3, [place] Intern pager: [phone], text pages welcome

## 2019-03-31 NOTE — Progress Notes (Signed)
CCM verbal its okay to hold lasix until pt can be supine

## 2019-03-31 NOTE — Progress Notes (Addendum)
NAME:  Ashley Moran, MRN:  [id number], DOB:  [birth year], LOS: 3 ADMISSION DATE:  03-31-2019, CONSULTATION DATE:  12/30 REFERRING MD:  Dr. Deirdre Priest FPTS, CHIEF COMPLAINT:  Hypoxia COVID 75  Brief History   69 year old female ad mitted 12/30 with COVID pneumonia. She became progressively hypoxemic throughout the evening hours prompting PCCM consultation.   History of present illness   69 year old female with PMH as below, which is significant for CAD, PAD, HTN, HLD, and former smoker. She presented to [facility] ED 12/30 with complaints of dyspnea after having a known sick contact to someone with COVID-19 at a birthday party on 12/15. Also c/o abdominal pain and bilateral leg claudication of which she has a history. She required 6L [place] to maintain O2 sats or 90%. POC COVID antigen was positive. She was admitted to the FPTS for management of COVID pneumonia. In the evening hours of 12/30 she became progressively hypoxemic with escalating FiO2 demands up to 15L. PCCM was consulted.   Past Medical History   has a past medical history of Coronary artery disease involving native coronary artery of native heart with angina pectoris (HCC) (08/2017), Essential hypertension, Heavy cigarette smoker (20-39 per day), and Hyperlipidemia with target LDL less than 70.   Significant Hospital Events    12/31 - - now in ICU. PCT > 150. On NRB 15L at 100%. BP normal. She reports being of [place] heritage Denies major complaints but later told RN she has nausea and chronic cramping of lower extremity. Not proning  1/1 - on 15 L FM and 100%. Not proning well. Only lies on side. Concern hypoxemia worse - currently RR 30, fio2 need same and not paradoxical. Gets dyspneic trying to sit and pulse ox 77%. Eating makes hyer hypoxemc. CRP down to 7.0   Consults:  PCCM  Procedures:    Significant Diagnostic Tests:  CT chest 12/30> Multifocal pneumonia. CT renal stone study 12/30 > No acute intra-abdominopelvic  pathology. Specifically No hydronephrosis or nephrolithiasis. Fatty liver. No bowel obstruction or active inflammation. Normal appendix V/Q 12/30 > no convincing evidence for PE  Micro Data:  POC SARS COV 2 antigen 12/30 > positive BC 12/30 >  HIV 12/30 - neg urnie strep 12/31 - neg  Antimicrobials:  Ceftriaxone 12/30 >1/2 Azithromycin 12/30 >1/2 Xx vanc 1/2 Zosyn 1/2   Decadron 12/30 > Remdesivir 12/30 > March 30, 2019 stop secondary to increased liver function test  Interim history/subjective:    03/31/2019 -/nursing reports progressive worsening along with family practice resident reporting the same.  She is currently on 15 L facemask along with heated high flow nasal cannula.  She is tachycardic at 30/min without paradoxus.  Pulse ox is around 77% on the ear probe.  She still has difficulty lying prone.   Objective   Blood pressure (!) 149/69, pulse 98, temperature 99.6 F (37.6 C), temperature source Oral, resp. rate (!) 33, weight 62.4 kg, SpO2 (!) 73 %.    FiO2 (%):  [100 %] 100 %   Intake/Output Summary (Last 24 hours) at 03/31/2019 0819 Last data filed at 03/31/2019 0700 Gross per 24 hour  Intake 1381 ml  Output 1350 ml  Net 31 ml   Filed Weights   03/29/19 0500  Weight: 62.4 kg    General Appearance:  Looks deconditioned with a puffy face Head:  Normocephalic, without obvious abnormality, atraumatic Eyes:  PERRL -yes, conjunctiva/corneas -Mody     Ears:  Normal external ear canals, both  ears Nose:  G tube -no but has heated high flow nasal cannula and facemask Throat:  ETT TUBE -no, OG tube -no Neck:  Supple,  No enlargement/tenderness/nodules Lungs: Clear to auscultation bilaterally, tachypneic with respiratory rate 30.  No paradoxical breathing.  No accessory muscle use.  Able to still talk sentences but not as good as a few days ago Heart:  S1 and S2 normal, no murmur, CVP -no.  Pressors -no Abdomen:  Soft, no masses, no organomegaly Genitalia / Rectal:   Not done Extremities:  Extremities-intact Skin:  ntact in exposed areas . Sacral area -not examined Neurologic:  Sedation -none-> RASS -+1. Moves all 4s -yes. CAM-ICU -negative. Orientation -x3+           Resolved Hospital Problem list     Assessment & Plan:   Acute hypoxemic respiratory failure: secondary to COVID-19 pneumonia +/- CAP - 03/31/2019 slowly getting worse.  Not proning.  Still happy hypoxemic without paradoxus   Plan  - o2 for pulse ox > 88% [but unable but happy at 77]  -Continue   HHFNC -Continue facemask  - intubate if paradoxical or tiring out or mental status change  - avoid bipap to extent possibe  - do prone in bed 2:1 ratio for the day (emphasized to nursing) -emphasized strongly to the patient -  Get cxr  - lasix x1 - incentive spiroemtry if cannot prone  COVID-19 with hypoxemia and infiltrates  -Reduced procalcitonin on ceftriaxone and azithromycin  plan -  Decadron x 10 days [start date 04/02/2019] - Remdesivir x 5 days -[dc due to rise in LFT, March 30, 2019] - Holding off TOCI (positive data in minorities EMPACTA trial) due to high PCT    - check quant gold  Likely bacterial sepsis - source lung - basis: high procalcitonin.  Urine strep negative  03/31/2019 -low-grade fever plus white count around 15,000.  Procalcitonin still very high   plan -Change antibiotic to vancomycin and Zosyn. - wait  urine leg  - await RVP - recheck PCT   Hypotension:  03/31/2019 - resolved on March 29, 2019  Plan  - MAP goal > 65   AKI - likely due to dehydration   - 03/31/2019  - improved.  Creatinine down to 1.2 mg percent  Plan  - chane fluid to mainteance - hold home ace inhibitor  Electrolye imbalance  03/31/2019 -hyperkalemia +   Plan  -Lokelma again -hold home ace inhibitor   Acute Transaminiits - likely due to covid versus remdesivir.  Remdesivir stopped March 30, 2019  03/31/2019 -improved after stopping  remdesivir   Plan  - monitor - dc  remdesivir   Nausea  03/30/19 - some nausea with eatng.  Not reported January 2  Plan  - zofran prn  CAD -echo ef 65% in June 2019 PAD On chronic plavix  03/30/2019 -normal trop. D-dimer just > 1. Duplex LE negative. QTc 0.42 on January 1  Plan  -- holding home carvedilol, HCTZ, and lisinopril as she has been transiently hypotensive.   AT risk hyperglycemia  Plan  - ssi  Best practice:  Diet: NPO Pain/Anxiety/Delirium protocol (if indicated): NA VAP protocol (if indicated): NA DVT prophylaxis: heparin SQ standard dose + baseline plavix GI prophylaxis: NA Glucose control: SSI Mobility: BR Code Status: full code (do whatever you need to do to save her) Family Communication:  FPTS updated daughter 03/31/2019 - ccm md updated daughter including toci exclusion and high risk for intubation  Disposition: ICU (CCM now primary from 03/31/2019)  Ccm consult     ATTESTATION & SIGNATURE   The patient Ashley HainesLeticia Moran is critically ill with multiple organ systems failure and requires high complexity decision making for assessment and support, frequent evaluation and titration of therapies, application of advanced monitoring technologies and extensive interpretation of multiple databases.   Critical Care Time devoted to patient care services described in this note is  30  Minutes. This time reflects time of care of this signee Dr Kalman ShanMurali Elba Schaber. This critical care time does not reflect procedure time, or teaching time or supervisory time of PA/NP/Med student/Med Resident etc but could involve care discussion time     Dr. Kalman ShanMurali Yzabelle Calles, M.D., [facility].C.P Pulmonary and Critical Care Medicine Staff Physician [facility] Pager: [phone], If no answer or between  15:00h - 7:00h: call [phone]  03/31/2019 8:29 AM    LABS   Results for Ashley HainesMCNEAL, Mahrosh (MRN [id number]) as of 03/29/2019 08:57   Ref. Range 09/20/2017 12:07  D-dimer Latest Ref Range: 0.00 - 0.49 mg/L FEU 1.60 (H)  Results for Ashley HainesMCNEAL, Jadia (MRN [id number]) as of 03/29/2019 08:57  Ref. Range 05-Aug-2018 10:00 03/29/2019 02:00  CRP Latest Ref Range: <1.0 mg/dL 86.513.8 (H) 78.414.0 (H)   PULMONARY Recent Labs  Lab 05-25-18 1014  PHART 7.360  PCO2ART 27.8*  PO2ART 51.0*  HCO3 15.8*  TCO2 17*  O2SAT 86.0    CBC Recent Labs  Lab 03/29/19 0200 03/30/19 0015 03/31/19 0413  HGB 11.0* 10.2* 9.7*  HCT 36.2 32.4* 31.3*  WBC 6.4 13.1* 13.9*  PLT 424* 470* 487*    COAGULATION Recent Labs  Lab 03/31/19 0413  INR 1.2    CARDIAC  No results for input(s): TROPONINI in the last 168 hours. No results for input(s): PROBNP in the last 168 hours.   CHEMISTRY Recent Labs  Lab 03/29/19 0200 03/30/19 0015 03/30/19 0655 03/30/19 1650 03/31/19 0413  NA 139 141 143 140 142  K 5.0 6.1* 5.8* 5.1 5.7*  CL 109 114* 117* 112* 109  CO2 17* 20* 20* 20* 25  GLUCOSE 276* 179* 165* 296* 169*  BUN 50* 43* 41* 38* 30*  CREATININE 1.41* 1.10* 1.05* 1.31* 1.20*  CALCIUM 8.5* 8.0* 7.9* 7.9* 8.4*  MG  --   --   --   --  2.2  PHOS  --   --   --   --  2.6   Estimated Creatinine Clearance: 36.6 mL/min (A) (by C-G formula based on SCr of 1.2 mg/dL (H)).   LIVER Recent Labs  Lab 05-25-18 1000 03/29/19 0200 03/30/19 0015 03/31/19 0413  AST 160* 134* 508* 135*  ALT 146* 121* 365* 304*  ALKPHOS 63 63 101 139*  BILITOT 1.0 0.6 0.4 0.4  PROT 7.4 6.1* 5.6* 5.5*  ALBUMIN 3.0* 2.4* 2.3* 2.2*  INR  --   --   --  1.2     INFECTIOUS Recent Labs  Lab 05-25-18 1000 03/29/19 0212 03/29/19 0725 03/30/19 0015 03/31/19 0413  LATICACIDVEN 2.9* 2.6* 1.8 1.6  --   PROCALCITON >150.00  --   --   --  74.74     ENDOCRINE CBG (last 3)  Recent Labs    03/30/19 2319 03/31/19 0448 03/31/19 0755  GLUCAP 204* 155* 149*         IMAGING x48h  - image(s) personally visualized  -   highlighted in bold VAS US LOWER  EXTREMITY VENOUS (DVT)  Result Date: 03/30/2019  Lower Venous Study Indications: COVID-19 positive. Acute respiratory failure.  Limitations: Body habitus, poor ultrasound/tissue interface and Suboptimal patient position, patient movement secondary to pain. Comparison Study: No prior study. Performing Technologist: Maudry Mayhew MHA, RDMS, RVT, RDCS  Examination Guidelines: A complete evaluation includes B-mode imaging, spectral Doppler, color Doppler, and power Doppler as needed of all accessible portions of each vessel. Bilateral testing is considered an integral part of a complete examination. Limited examinations for reoccurring indications may be performed as noted.  +---------+---------------+---------+-----------+----------+--------------+ RIGHT    CompressibilityPhasicitySpontaneityPropertiesThrombus Aging +---------+---------------+---------+-----------+----------+--------------+ CFV                                                   Not visualized +---------+---------------+---------+-----------+----------+--------------+ SFJ                                                   Not visualized +---------+---------------+---------+-----------+----------+--------------+ FV Prox  Full                                                        +---------+---------------+---------+-----------+----------+--------------+ FV Mid   Full                                                        +---------+---------------+---------+-----------+----------+--------------+ FV DistalFull                                                        +---------+---------------+---------+-----------+----------+--------------+ PFV                                                   Not visualized +---------+---------------+---------+-----------+----------+--------------+ POP      Full           Yes      Yes                                  +---------+---------------+---------+-----------+----------+--------------+ PTV      Full                                                        +---------+---------------+---------+-----------+----------+--------------+ PERO  Not visualized +---------+---------------+---------+-----------+----------+--------------+   +---------+---------------+---------+-----------+----------+--------------+ LEFT     CompressibilityPhasicitySpontaneityPropertiesThrombus Aging +---------+---------------+---------+-----------+----------+--------------+ CFV                                                   Not visualized +---------+---------------+---------+-----------+----------+--------------+ SFJ                                                   Not visualized +---------+---------------+---------+-----------+----------+--------------+ FV Prox                                               Not visualized +---------+---------------+---------+-----------+----------+--------------+ FV Mid                                                Not visualized +---------+---------------+---------+-----------+----------+--------------+ FV Distal                                             Not visualized +---------+---------------+---------+-----------+----------+--------------+ PFV                                                   Not visualized +---------+---------------+---------+-----------+----------+--------------+ POP      Full           Yes      Yes                                 +---------+---------------+---------+-----------+----------+--------------+ PTV      Full                                                        +---------+---------------+---------+-----------+----------+--------------+ PERO     Full                                                         +---------+---------------+---------+-----------+----------+--------------+     Summary: Right: There is no evidence of deep vein thrombosis in the lower extremity. However, portions of this examination were limited- see technologist comments above. No cystic structure found in the popliteal fossa. Left: There is no evidence of deep vein thrombosis in the lower extremity. However, portions of this examination were limited- see technologist comments above. No cystic structure found in the popliteal fossa.  *See table(s) above for measurements and observations. Electronically signed by Waverly Ferrari MD on 03/30/2019 at 6:26:32 AM.  Final

## 2019-03-31 NOTE — Progress Notes (Addendum)
Updated critical care note  Patient with progressive respiratory failure.  Intubated.  During intubation required epinephrine bolus, bicarb bolus and starting the Neo-Synephrine drip.  Fluid bolus will be given.  Patient was intubated after bagging.  Size 3 MAC blade used.  She lost her right upper incisor during the intubation.  This was removed successfully.  Current issues are ARDS secondary to COVID-19 pneumonia: Start deep sedation and ARDS protocol.  Might need Nimbex and proning.  We will also get tracheal aspirate.  Circulatory shock following intubation: Fluid bolus and Neo-Synephrine   [id number] updated  ATTESTATION & SIGNATURE   The patient Ashley Moran is critically ill with multiple organ systems failure and requires high complexity decision making for assessment and support, frequent evaluation and titration of therapies, application of advanced monitoring technologies and extensive interpretation of multiple databases.   Critical Care Time devoted to patient care services described in this note plus additional time with familhya nd coorelating care  is  75  Minutes in additon to prior 30 minutes. This time reflects time of care of this signee Dr Brand Males. This critical care time does not reflect procedure time, or teaching time or supervisory time of PA/NP/Med student/Med Resident etc but could involve care discussion time     Dr. Brand Males, M.D., [facility].C.P Pulmonary and Critical Care Medicine Staff Physician [place] [facility] Pager: [phone], If no answer or between  15:00h - 7:00h: call [phone]  03/31/2019 1:29 PM

## 2019-03-31 NOTE — Significant Event (Signed)
PCCM Overnight  ABG reviewed PF ratio <150   Plan: Continuous Nimbex infusion initiate 15 hr prone Discussed with ICU Rn and RT  .Marland KitchenSigned Dr Newell Coral Pulmonary Critical Care Locums

## 2019-03-31 NOTE — Progress Notes (Signed)
Pt desat into 40's during central line placement. NP asked me to Page MD. Verbal order for bicarb and roc push, prn versed and fentanyl given. Once line was placed we were able to raise the head of the bed and her sats went back up. She also got hypotensive so I had to turn her Neo back on.

## 2019-04-01 ENCOUNTER — Inpatient Hospital Stay (HOSPITAL_COMMUNITY): Payer: BC Managed Care – PPO

## 2019-04-01 DIAGNOSIS — J9601 Acute respiratory failure with hypoxia: Secondary | ICD-10-CM

## 2019-04-01 DIAGNOSIS — J80 Acute respiratory distress syndrome: Secondary | ICD-10-CM

## 2019-04-01 LAB — POCT I-STAT 7, (LYTES, BLD GAS, ICA,H+H)
Acid-Base Excess: 4 mmol/L — ABNORMAL HIGH (ref 0.0–2.0)
Acid-Base Excess: 3 mmol/L — ABNORMAL HIGH (ref 0.0–2.0)
Bicarbonate: 32.6 mmol/L — ABNORMAL HIGH (ref 20.0–28.0)
Bicarbonate: 31.7 mmol/L — ABNORMAL HIGH (ref 20.0–28.0)
Calcium, Ion: 1.24 mmol/L (ref 1.15–1.40)
Calcium, Ion: 1.25 mmol/L (ref 1.15–1.40)
HCT: 29 % — ABNORMAL LOW (ref 36.0–46.0)
HCT: 29 % — ABNORMAL LOW (ref 36.0–46.0)
Hemoglobin: 9.9 g/dL — ABNORMAL LOW (ref 12.0–15.0)
Hemoglobin: 9.9 g/dL — ABNORMAL LOW (ref 12.0–15.0)
O2 Saturation: 99 %
O2 Saturation: 96 %
Patient temperature: 95.5
Patient temperature: 97.5
Potassium: 5.1 mmol/L (ref 3.5–5.1)
Potassium: 5 mmol/L (ref 3.5–5.1)
Sodium: 146 mmol/L — ABNORMAL HIGH (ref 135–145)
Sodium: 148 mmol/L — ABNORMAL HIGH (ref 135–145)
TCO2: 35 mmol/L — ABNORMAL HIGH (ref 22–32)
TCO2: 34 mmol/L — ABNORMAL HIGH (ref 22–32)
pCO2 arterial: 66.7 mmHg (ref 32.0–48.0)
pCO2 arterial: 73.7 mmHg (ref 32.0–48.0)
pH, Arterial: 7.288 — ABNORMAL LOW (ref 7.350–7.450)
pH, Arterial: 7.238 — ABNORMAL LOW (ref 7.350–7.450)
pO2, Arterial: 150 mmHg — ABNORMAL HIGH (ref 83.0–108.0)
pO2, Arterial: 98 mmHg (ref 83.0–108.0)

## 2019-04-01 LAB — COMPREHENSIVE METABOLIC PANEL WITH GFR
ALT: 225 U/L — ABNORMAL HIGH (ref 0–44)
AST: 66 U/L — ABNORMAL HIGH (ref 15–41)
Albumin: 2 g/dL — ABNORMAL LOW (ref 3.5–5.0)
Alkaline Phosphatase: 118 U/L (ref 38–126)
Anion gap: 7 (ref 5–15)
BUN: 28 mg/dL — ABNORMAL HIGH (ref 8–23)
CO2: 31 mmol/L (ref 22–32)
Calcium: 8.2 mg/dL — ABNORMAL LOW (ref 8.9–10.3)
Chloride: 111 mmol/L (ref 98–111)
Creatinine, Ser: 1.41 mg/dL — ABNORMAL HIGH (ref 0.44–1.00)
GFR calc Af Amer: 45 mL/min — ABNORMAL LOW
GFR calc non Af Amer: 38 mL/min — ABNORMAL LOW
Glucose, Bld: 208 mg/dL — ABNORMAL HIGH (ref 70–99)
Potassium: 5.3 mmol/L — ABNORMAL HIGH (ref 3.5–5.1)
Sodium: 149 mmol/L — ABNORMAL HIGH (ref 135–145)
Total Bilirubin: 0.4 mg/dL (ref 0.3–1.2)
Total Protein: 5.3 g/dL — ABNORMAL LOW (ref 6.5–8.1)

## 2019-04-01 LAB — CBC WITH DIFFERENTIAL/PLATELET
Abs Immature Granulocytes: 0.07 10*3/uL (ref 0.00–0.07)
Basophils Absolute: 0 10*3/uL (ref 0.0–0.1)
Basophils Relative: 0 %
Eosinophils Absolute: 0 10*3/uL (ref 0.0–0.5)
Eosinophils Relative: 0 %
HCT: 30.5 % — ABNORMAL LOW (ref 36.0–46.0)
Hemoglobin: 9.1 g/dL — ABNORMAL LOW (ref 12.0–15.0)
Immature Granulocytes: 1 %
Lymphocytes Relative: 2 %
Lymphs Abs: 0.2 10*3/uL — ABNORMAL LOW (ref 0.7–4.0)
MCH: 22 pg — ABNORMAL LOW (ref 26.0–34.0)
MCHC: 29.8 g/dL — ABNORMAL LOW (ref 30.0–36.0)
MCV: 73.8 fL — ABNORMAL LOW (ref 80.0–100.0)
Monocytes Absolute: 0.5 10*3/uL (ref 0.1–1.0)
Monocytes Relative: 5 %
Neutro Abs: 9.2 10*3/uL — ABNORMAL HIGH (ref 1.7–7.7)
Neutrophils Relative %: 92 %
Platelets: 434 10*3/uL — ABNORMAL HIGH (ref 150–400)
RBC: 4.13 MIL/uL (ref 3.87–5.11)
RDW: 15.2 % (ref 11.5–15.5)
WBC: 10 10*3/uL (ref 4.0–10.5)
nRBC: 0.7 % — ABNORMAL HIGH (ref 0.0–0.2)

## 2019-04-01 LAB — BASIC METABOLIC PANEL
Anion gap: 6 (ref 5–15)
BUN: 29 mg/dL — ABNORMAL HIGH (ref 8–23)
CO2: 29 mmol/L (ref 22–32)
Calcium: 7.8 mg/dL — ABNORMAL LOW (ref 8.9–10.3)
Chloride: 108 mmol/L (ref 98–111)
Creatinine, Ser: 1.43 mg/dL — ABNORMAL HIGH (ref 0.44–1.00)
GFR calc Af Amer: 44 mL/min — ABNORMAL LOW (ref >60)
GFR calc non Af Amer: 38 mL/min — ABNORMAL LOW (ref >60)
Glucose, Bld: 227 mg/dL — ABNORMAL HIGH (ref 70–99)
Potassium: 4.8 mmol/L (ref 3.5–5.1)
Sodium: 143 mmol/L (ref 135–145)

## 2019-04-01 LAB — GLUCOSE, CAPILLARY
Glucose-Capillary: 146 mg/dL — ABNORMAL HIGH (ref 70–99)
Glucose-Capillary: 168 mg/dL — ABNORMAL HIGH (ref 70–99)
Glucose-Capillary: 144 mg/dL — ABNORMAL HIGH (ref 70–99)
Glucose-Capillary: 152 mg/dL — ABNORMAL HIGH (ref 70–99)
Glucose-Capillary: 156 mg/dL — ABNORMAL HIGH (ref 70–99)

## 2019-04-01 LAB — C-REACTIVE PROTEIN: CRP: 7 mg/dL — ABNORMAL HIGH (ref <1.0)

## 2019-04-01 LAB — PHOSPHORUS: Phosphorus: 4.1 mg/dL (ref 2.5–4.6)

## 2019-04-01 LAB — PROCALCITONIN: Procalcitonin: 31.55 ng/mL

## 2019-04-01 LAB — LACTATE DEHYDROGENASE: LDH: 408 U/L — ABNORMAL HIGH (ref 98–192)

## 2019-04-01 LAB — LEGIONELLA PNEUMOPHILA SEROGP 1 UR AG: L. pneumophila Serogp 1 Ur Ag: NEGATIVE

## 2019-04-01 LAB — D-DIMER, QUANTITATIVE: D-Dimer, Quant: 0.8 ug/mL-FEU — ABNORMAL HIGH (ref 0.00–0.50)

## 2019-04-01 LAB — TRIGLYCERIDES: Triglycerides: 82 mg/dL (ref <150)

## 2019-04-01 LAB — MAGNESIUM: Magnesium: 2 mg/dL (ref 1.7–2.4)

## 2019-04-01 MED ORDER — PRO-STAT SUGAR FREE PO LIQD
30.0000 mL | Freq: Two times a day (BID) | ORAL | Status: DC
  Administered 2019-04-01 – 2019-04-07 (×13): 30 mL
  Filled 2019-04-01 (×13): qty 30

## 2019-04-01 MED ORDER — FREE WATER
200.0000 mL | Freq: Three times a day (TID) | Status: DC
  Administered 2019-04-01 – 2019-04-09 (×20): 200 mL

## 2019-04-01 MED ORDER — FUROSEMIDE 10 MG/ML IJ SOLN
20.0000 mg | Freq: Once | INTRAMUSCULAR | Status: AC
  Administered 2019-04-01: 20 mg via INTRAVENOUS
  Filled 2019-04-01: qty 2

## 2019-04-01 MED ORDER — VITAL HIGH PROTEIN PO LIQD
1000.0000 mL | ORAL | Status: DC
  Administered 2019-04-01: 1000 mL

## 2019-04-01 MED ORDER — SODIUM ZIRCONIUM CYCLOSILICATE 10 G PO PACK
10.0000 g | PACK | ORAL | Status: AC
  Administered 2019-04-01: 10 g via ORAL
  Filled 2019-04-01: qty 1

## 2019-04-01 NOTE — Progress Notes (Signed)
Brief Nutrition Note RD working remotely.  Consult received for enteral/tube feeding initiation and management.  Adult Enteral Nutrition Protocol initiated. Full assessment to follow.  Admitting Dx: Abdominal pain [R10.9] Pneumonia due to COVID-19 virus [U07.1, J12.82] COVID-19 [U07.1]  Body mass index is 27.43 kg/m. Pt meets criteria for overweight based on current BMI.  Labs:  Recent Labs  Lab 03/31/19 0413 03/31/19 2122 04/01/19 0339 04/01/19 0456  NA 142 143 146* 149*  K 5.7* 5.3* 5.1 5.3*  CL 109 108  --  111  CO2 25 30  --  31  BUN 30* 30*  --  28*  CREATININE 1.20* 1.40*  --  1.41*  CALCIUM 8.4* 8.0*  --  8.2*  MG 2.2  --   --  2.0  PHOS 2.6  --   --  4.1  GLUCOSE 169* 326*  --  208*   Felix Pacini, MS, RD, LDN Office: [phone] Pager: [phone] After Hours/Weekend Pager: [phone]

## 2019-04-01 NOTE — Progress Notes (Signed)
CRITICAL VALUE ALERT  Critical Value:  PH 7.26/ PaC02 74/ PaO2 63/ HCO3 34/ SaO2 88% Date & Time Notied:  03/31/2019 2100  Provider Notified: Dr. Carlota Raspberry  Orders Received/Actions taken: Prep for Proning

## 2019-04-01 NOTE — Progress Notes (Signed)
eLink Physician-Brief Progress Note Patient Name: Ashley Moran DOB: Sep 18, 1950 MRN: [id number]   Date of Service  04/01/2019  HPI/Events of Note  Called for low UOP in this patient with COVID / ARDS. They are 1.5L positive for today so far. Received Lasix 20mg  IV yesterday with good effect though was still 400cc positive for the day.  While reviewing chart, I note that they are on PRVC with PEEP 10 and FIO2 0.8 but also saturating 99% per flowsheet.  eICU Interventions  - Ordered Lasix 20mg  IV. May benefit from once or twice daily dosing for goal net even to slightly negative.  - Will ask RT to reassess ventilator settings. Goal SpO2 ~92-94%. If we can decrease PEEP and/or FiO2 while still achieving saturations in that range we should do so.     Intervention Category Major Interventions: Respiratory failure - evaluation and management Intermediate Interventions: Hypervolemia - evaluation and management;Oliguria - evaluation and management  Majd Tissue 04/01/2019, 10:18 PM

## 2019-04-01 NOTE — Progress Notes (Signed)
FPTS Social Note  FPTS continues to follow patient closely while she is intubated and in the ICU.  We plan to resume care of patient when she is transferred back to the floor.  We appreciate CCM for their excellent care of Ms. Ashley Moran C. Frances Furbish, MD PGY-3, [facility] 04/01/2019 5:48 AM

## 2019-04-01 NOTE — Progress Notes (Signed)
RT NOTE: RT reported critical 73.7 PCO2 to RN and messaged MD.  ABG as follows: pH 7.23, PCO2 73.7, PO2 98, HCO3 31.7. RT will continue to monitor.

## 2019-04-01 NOTE — Progress Notes (Addendum)
PULMONARY / CRITICAL CARE MEDICINE   NAME:  Ashley Moran, MRN:  [id number], DOB:  January 20, 1951, LOS: 4 ADMISSION DATE:  03/04/2019, CONSULTATION DATE:  04/28/18 REFERRING MD: Dr. Maia Breslow  , CHIEF COMPLAINT:  Dyspnea  BRIEF HISTORY:    Ashley Moran is a 69 y.o female admitted 12/30 with covid pneumonia. Transferred to ICU on 12/31 for management of acute hypoxemic respiratory failure.    HISTORY OF PRESENT ILLNESS   Ashley Moran is a 69 y.o f with cad, pad, htn, hld, former smoker who presented on 12/30 with dyspnea, abdominal pain, bilateral leg claudication after being exposed to someone with covid19 at a birthday party on 12/15.  Required 6L via [place] on presentation subsequently further decompensated requiring 15L. Family Medicine teaching service transferred care to PCCM. Intubated on 03/31/19.   SIGNIFICANT PAST MEDICAL HISTORY   CAD Essential HTN Prior cigarette smoker (20-39 per day) Hyperlipidemia   SIGNIFICANT EVENTS:   Transferred to ICU 03/29/19  Left IJ CVC 03/31/19 Intubation 03/31/19  Proning 12:33am 04/01/19  STUDIES:   CT chest 12/30 multifocal pneumonia CT Renal 12/30 no acute intra-abdominopelvic pathology, fatty liver, no bowel obstruction or active inflammation  NM Pulmonary perfusion 12/30 no evidence for pulmonary embolism Abd xray 12/30 normal gas pattern  CXR 04/01/19 diffuse patchy opacities, ET tube in thoracic inlet. Progressive bilateral airspace disease.   CULTURES:  Blood Culture 12/30 no growth  4 days  Blood culture 12/30  CoNS  RPR sent pending  Respiratory culture pending Legionella 12/31 pending  ANTIMICROBIALS:  Ceftriaxone 12/30 >1/2 Azithromycin 12/30 >1/2  Vancomycin 1/2> Zosyn 1/2>  Decadron 12/30 > Remdesivir 12/30 >1/1  LINES/TUBES:   Bilateral peripheral IV  Left IJ 03/31/19  CONSULTANTS:    SUBJECTIVE:  Patient was sedated  CONSTITUTIONAL: BP (!) 103/57   Pulse 60   Temp 98.2 F (36.8 C) (Rectal)   Resp (!) 24   Wt  61.6 kg   SpO2 100%   BMI 27.43 kg/m   I/O last 3 completed shifts: In: 3329.5 [I.V.:3111.2; IV Piggyback:218.3] Out: 2595 [Urine:2595]     Vent Mode: PRVC FiO2 (%):  [100 %] 100 % Set Rate:  [24 bmp] 24 bmp Vt Set:  [340 mL] 340 mL PEEP:  [8 cmH20-10 cmH20] 10 cmH20 Plateau Pressure:  [26 cmH20-30 cmH20] 28 cmH20  PHYSICAL EXAM: General:  Sedated placed in prone position Neuro:  Sedated HEENT:  Intubated  Cardiovascular:  DP and TP pulses appreciated Lungs: distant lung sounds   Abdomen: patient in prone positioned couldn't examine Musculoskeletal: no edema bilaterally in LE Skin:  With some mild abrasions in lower extremity  RESOLVED PROBLEM LIST   ASSESSMENT AND PLAN    #Moderate ARDS #Acute hyoxemic respiratory failure 2/2 COVID Pneumonia Patient with progressive respiratory failure with additional hepatic end organ damage. PF ratio 150. Consistent decrease in inflammatory markers and liver enzymes.   Patient on ventilator PRVC FiO2 100%, PEEP 10, RR 24, TV 340.  D-dimer 0.8, LDH 408, procal 31.55, crp 7, wbc 10. Transaminitis ast 66, alt 225.   -continue proning for 12-16hrs, started 12:33am 04/01/19 -daily chest xray -Re-examine patient after proning complete -Sedation with versed and fentanyl -pending tracheal aspirate culture  -continue vancomycin and zosyn for possible superimposed bacterial infection. Day 2 -continue decadron 6mg  qd 03/31/19 for 7 doses  -albuterol 1-2 puffs q4prn  #Glucocorticoid induced hyperglycemia  -continue levemir 10u bid and ssi resistant   #CAD s/p RCA stent 2019 -continue plavix 75mg  per tube   #  Essential Hypertension Patient's blood pressure has ranged 90-110/50-60s over the past 24 hrs. Patient is currently receiving phenylephrine infusion since 03/31/19.  -continue phenylephrine for MAP of 65  Best Practice / Goals of Care / Disposition.   DVT PROPHYLAXIS: Heparin SUP: Protonix  NUTRITION:  MOBILITY:  GOALS OF CARE:  Full code FAMILY DISCUSSIONS:  DISPOSITION  LABS  Glucose Recent Labs  Lab 03/31/19 1137 03/31/19 1632 03/31/19 1959 03/31/19 2328 04/01/19 0313 04/01/19 0713  GLUCAP 159* 243* 283* 226* 146* 168*    BMET Recent Labs  Lab 03/31/19 0413 03/31/19 2122 04/01/19 0339 04/01/19 0456  NA 142 143 146* 149*  K 5.7* 5.3* 5.1 5.3*  CL 109 108  --  111  CO2 25 30  --  31  BUN 30* 30*  --  28*  CREATININE 1.20* 1.40*  --  1.41*  GLUCOSE 169* 326*  --  208*    Liver Enzymes Recent Labs  Lab 03/30/19 0015 03/31/19 0413 04/01/19 0456  AST 508* 135* 66*  ALT 365* 304* 225*  ALKPHOS 101 139* 118  BILITOT 0.4 0.4 0.4  ALBUMIN 2.3* 2.2* 2.0*    Electrolytes Recent Labs  Lab 03/31/19 0413 03/31/19 2122 04/01/19 0456  CALCIUM 8.4* 8.0* 8.2*  MG 2.2  --  2.0  PHOS 2.6  --  4.1    CBC Recent Labs  Lab 03/30/19 0015 03/31/19 0413 03/31/19 2019 04/01/19 0339 04/01/19 0456  WBC 13.1* 13.9*  --   --  10.0  HGB 10.2* 9.7* 11.2* 9.9* 9.1*  HCT 32.4* 31.3* 33.0* 29.0* 30.5*  PLT 470* 487*  --   --  434*    ABG Recent Labs  Lab 03/31/19 1545 03/31/19 2019 04/01/19 0339  PHART 7.175* 7.267* 7.288*  PCO2ART 79.9* 74.1* 66.7*  PO2ART 38.0* 63.0* 150.0*    Coag's Recent Labs  Lab 03/31/19 0413  INR 1.2    Sepsis Markers Recent Labs  Lab 04-05-19 1000 03/29/19 0212 03/29/19 0725 03/30/19 0015 03/31/19 0413 04/01/19 0456  LATICACIDVEN 2.9* 2.6* 1.8 1.6  --   --   PROCALCITON >150.00  --   --   --  74.74 31.55    Cardiac Enzymes No results for input(s): TROPONINI, PROBNP in the last 168 hours.  PAST MEDICAL HISTORY :   She  has a past medical history of Coronary artery disease involving native coronary artery of native heart with angina pectoris (HCC) (08/2017), Essential hypertension, Heavy cigarette smoker (20-39 per day), and Hyperlipidemia with target LDL less than 70.  PAST SURGICAL HISTORY:  She  has a past surgical history that includes  LEFT HEART CATH AND CORONARY ANGIOGRAPHY (N/A, 09/14/2017); CORONARY STENT INTERVENTION (N/A, 09/14/2017); NM MYOVIEW LTD (09/13/2017); transthoracic echocardiogram (09/13/2017); and ABDOMINAL AORTOGRAM W/LOWER EXTREMITY (N/A, 02/14/2019).  No Known Allergies  No current facility-administered medications on file prior to encounter.   Current Outpatient Medications on File Prior to Encounter  Medication Sig  . atorvastatin (LIPITOR) 40 MG tablet TAKE 1 TABLET (40 MG TOTAL) BY MOUTH DAILY AT 6 PM. (Patient taking differently: Take 40 mg by mouth daily. )  . carvedilol (COREG) 25 MG tablet TAKE 1 TABLET BY MOUTH TWICE A DAY (Patient taking differently: Take 25 mg by mouth 2 (two) times daily. )  . clopidogrel (PLAVIX) 75 MG tablet TAKE 1 TABLET BY MOUTH EVERY DAY (Patient taking differently: Take 75 mg by mouth daily. )  . hydrochlorothiazide (HYDRODIURIL) 25 MG tablet Take 1 tablet (25 mg total) by mouth daily.  [name]  lisinopril (ZESTRIL) 20 MG tablet TAKE 1 TABLET BY MOUTH EVERY DAY (Patient taking differently: Take 20 mg by mouth daily. )    FAMILY HISTORY:   Her family history includes Other in her mother.  SOCIAL HISTORY:  She  reports that she quit smoking about 18 months ago. She smoked 0.50 packs per day. She has never used smokeless tobacco. She reports that she does not drink alcohol or use drugs.  REVIEW OF SYSTEMS:    Not able to obtain

## 2019-04-01 NOTE — Significant Event (Signed)
PCCM UPDATE  Pt proned at 12:33 AM on 04/01/2019 No adverse events Hemodynamically stable MAP 72 Neo @ 20 Versed 10 Fentanyl 250  O2 sat 100% Nimbex @ 3.5 Train is at goal    Signed Dr Newell Coral [facility] Critical [facility] Locums

## 2019-04-01 NOTE — Progress Notes (Signed)
During 0800 assessment, 0/4 twitches noted on Train of Four. Nimbex at 1.5 mcg/kg/min. Dr. Marchelle Gearing alerted to lack of twitches, and he instructed this RN not to lower Nimbex rate. Titrating according to vent synchrony. Will continue to closely monitor pt.

## 2019-04-02 ENCOUNTER — Inpatient Hospital Stay (HOSPITAL_COMMUNITY): Payer: BC Managed Care – PPO

## 2019-04-02 DIAGNOSIS — J9601 Acute respiratory failure with hypoxia: Secondary | ICD-10-CM

## 2019-04-02 LAB — POCT I-STAT 7, (LYTES, BLD GAS, ICA,H+H)
Acid-Base Excess: 4 mmol/L — ABNORMAL HIGH (ref 0.0–2.0)
Acid-Base Excess: 4 mmol/L — ABNORMAL HIGH (ref 0.0–2.0)
Bicarbonate: 33.8 mmol/L — ABNORMAL HIGH (ref 20.0–28.0)
Bicarbonate: 33 mmol/L — ABNORMAL HIGH (ref 20.0–28.0)
Calcium, Ion: 1.27 mmol/L (ref 1.15–1.40)
Calcium, Ion: 1.33 mmol/L (ref 1.15–1.40)
HCT: 30 % — ABNORMAL LOW (ref 36.0–46.0)
HCT: 30 % — ABNORMAL LOW (ref 36.0–46.0)
Hemoglobin: 10.2 g/dL — ABNORMAL LOW (ref 12.0–15.0)
Hemoglobin: 10.2 g/dL — ABNORMAL LOW (ref 12.0–15.0)
O2 Saturation: 96 %
O2 Saturation: 84 %
Patient temperature: 36.7
Potassium: 4.8 mmol/L (ref 3.5–5.1)
Potassium: 4.4 mmol/L (ref 3.5–5.1)
Sodium: 148 mmol/L — ABNORMAL HIGH (ref 135–145)
Sodium: 149 mmol/L — ABNORMAL HIGH (ref 135–145)
TCO2: 36 mmol/L — ABNORMAL HIGH (ref 22–32)
TCO2: 35 mmol/L — ABNORMAL HIGH (ref 22–32)
pCO2 arterial: 79.3 mmHg (ref 32.0–48.0)
pCO2 arterial: 76.5 mmHg (ref 32.0–48.0)
pH, Arterial: 7.236 — ABNORMAL LOW (ref 7.350–7.450)
pH, Arterial: 7.243 — ABNORMAL LOW (ref 7.350–7.450)
pO2, Arterial: 102 mmHg (ref 83.0–108.0)
pO2, Arterial: 59 mmHg — ABNORMAL LOW (ref 83.0–108.0)

## 2019-04-02 LAB — CBC WITH DIFFERENTIAL/PLATELET
Abs Immature Granulocytes: 0.08 10*3/uL — ABNORMAL HIGH (ref 0.00–0.07)
Basophils Absolute: 0 10*3/uL (ref 0.0–0.1)
Basophils Relative: 0 %
Eosinophils Absolute: 0 10*3/uL (ref 0.0–0.5)
Eosinophils Relative: 0 %
HCT: 30.9 % — ABNORMAL LOW (ref 36.0–46.0)
Hemoglobin: 8.8 g/dL — ABNORMAL LOW (ref 12.0–15.0)
Immature Granulocytes: 1 %
Lymphocytes Relative: 1 %
Lymphs Abs: 0.1 10*3/uL — ABNORMAL LOW (ref 0.7–4.0)
MCH: 21.7 pg — ABNORMAL LOW (ref 26.0–34.0)
MCHC: 28.5 g/dL — ABNORMAL LOW (ref 30.0–36.0)
MCV: 76.1 fL — ABNORMAL LOW (ref 80.0–100.0)
Monocytes Absolute: 0.5 10*3/uL (ref 0.1–1.0)
Monocytes Relative: 5 %
Neutro Abs: 10.1 10*3/uL — ABNORMAL HIGH (ref 1.7–7.7)
Neutrophils Relative %: 93 %
Platelets: 373 10*3/uL (ref 150–400)
RBC: 4.06 MIL/uL (ref 3.87–5.11)
RDW: 15.5 % (ref 11.5–15.5)
WBC: 10.8 10*3/uL — ABNORMAL HIGH (ref 4.0–10.5)
nRBC: 0.3 % — ABNORMAL HIGH (ref 0.0–0.2)

## 2019-04-02 LAB — COMPREHENSIVE METABOLIC PANEL
ALT: 147 U/L — ABNORMAL HIGH (ref 0–44)
AST: 22 U/L (ref 15–41)
Albumin: 1.9 g/dL — ABNORMAL LOW (ref 3.5–5.0)
Alkaline Phosphatase: 80 U/L (ref 38–126)
Anion gap: 10 (ref 5–15)
BUN: 30 mg/dL — ABNORMAL HIGH (ref 8–23)
CO2: 27 mmol/L (ref 22–32)
Calcium: 8.1 mg/dL — ABNORMAL LOW (ref 8.9–10.3)
Chloride: 110 mmol/L (ref 98–111)
Creatinine, Ser: 1.61 mg/dL — ABNORMAL HIGH (ref 0.44–1.00)
GFR calc Af Amer: 38 mL/min — ABNORMAL LOW (ref >60)
GFR calc non Af Amer: 33 mL/min — ABNORMAL LOW (ref >60)
Glucose, Bld: 257 mg/dL — ABNORMAL HIGH (ref 70–99)
Potassium: 4.9 mmol/L (ref 3.5–5.1)
Sodium: 147 mmol/L — ABNORMAL HIGH (ref 135–145)
Total Bilirubin: 0.6 mg/dL (ref 0.3–1.2)
Total Protein: 5.2 g/dL — ABNORMAL LOW (ref 6.5–8.1)

## 2019-04-02 LAB — PHOSPHORUS: Phosphorus: 4.1 mg/dL (ref 2.5–4.6)

## 2019-04-02 LAB — GLUCOSE, CAPILLARY
Glucose-Capillary: 189 mg/dL — ABNORMAL HIGH (ref 70–99)
Glucose-Capillary: 190 mg/dL — ABNORMAL HIGH (ref 70–99)
Glucose-Capillary: 192 mg/dL — ABNORMAL HIGH (ref 70–99)
Glucose-Capillary: 197 mg/dL — ABNORMAL HIGH (ref 70–99)
Glucose-Capillary: 223 mg/dL — ABNORMAL HIGH (ref 70–99)
Glucose-Capillary: 232 mg/dL — ABNORMAL HIGH (ref 70–99)
Glucose-Capillary: 269 mg/dL — ABNORMAL HIGH (ref 70–99)

## 2019-04-02 LAB — D-DIMER, QUANTITATIVE: D-Dimer, Quant: 1.71 ug/mL-FEU — ABNORMAL HIGH (ref 0.00–0.50)

## 2019-04-02 LAB — TROPONIN I (HIGH SENSITIVITY): Troponin I (High Sensitivity): 41 ng/L — ABNORMAL HIGH (ref <18)

## 2019-04-02 LAB — CULTURE, BLOOD (ROUTINE X 2)
Culture: NO GROWTH
Special Requests: ADEQUATE

## 2019-04-02 LAB — LACTATE DEHYDROGENASE: LDH: 317 U/L — ABNORMAL HIGH (ref 98–192)

## 2019-04-02 LAB — MRSA PCR SCREENING: MRSA by PCR: NEGATIVE

## 2019-04-02 LAB — MAGNESIUM: Magnesium: 1.8 mg/dL (ref 1.7–2.4)

## 2019-04-02 LAB — C-REACTIVE PROTEIN: CRP: 3.7 mg/dL — ABNORMAL HIGH (ref <1.0)

## 2019-04-02 MED ORDER — VITAL AF 1.2 CAL PO LIQD
1000.0000 mL | ORAL | Status: DC
  Administered 2019-04-02 – 2019-04-09 (×5): 1000 mL
  Filled 2019-04-02 (×3): qty 1000

## 2019-04-02 MED ORDER — ADULT MULTIVITAMIN W/MINERALS CH
1.0000 | ORAL_TABLET | Freq: Every day | ORAL | Status: DC
  Administered 2019-04-02 – 2019-04-09 (×8): 1
  Filled 2019-04-02 (×8): qty 1

## 2019-04-02 MED ORDER — VANCOMYCIN HCL IN DEXTROSE 1-5 GM/200ML-% IV SOLN: 1000.0000 mg | INTRAVENOUS | Status: DC

## 2019-04-02 NOTE — Progress Notes (Signed)
Pharmacy Antibiotic Note  Ashley Moran is a 69 y.o. female admitted on 04-12-2019 with hypoxia secondary to COVID-19.  Pharmacy has been consulted for vancomycin and zosyn dosing. She has been on ceftriaxone, azithromycin for concern over bacterial PNA in addition to COVID-19, and now has developed increasing SOB and hypoxemia. SCr up to 1.61.  Plan: Zosyn 3.375g IV q8h (4 hour infusion).  Change vancomycin to 1g IV q48h. Goal AUC 400-550 Expected AUC: 478 SCr used: 1.61 Monitor clinical progress, c/s, renal function F/u de-escalation plan/LOT, vancomycin levels as indicated   Weight: 135 lb 12.9 oz (61.6 kg)  Temp (24hrs), Avg:97.5 F (36.4 C), Min:94.5 F (34.7 C), Max:100 F (37.8 C)  Recent Labs  Lab April 12, 2019 0851 04/12/2019 1644 04/12/19 2202 03/29/19 0200 03/29/19 0212 03/29/19 0725 03/30/19 0015 03/31/19 0413 03/31/19 2122 04/01/19 0456 04/01/19 2135 04/02/19 0334  WBC  --   --   --  6.4  --   --  13.1* 13.9*  --  10.0  --  10.8*  CREATININE   < >  --  1.47* 1.41*  --   --  1.10* 1.20* 1.40* 1.41* 1.43* 1.61*  LATICACIDVEN  --  2.2* 1.8  --  2.6* 1.8 1.6  --   --   --   --   --    < > = values in this interval not displayed.    Estimated Creatinine Clearance: 27.1 mL/min (A) (by C-G formula based on SCr of 1.61 mg/dL (H)).    No Known Allergies   Babs Bertin, PharmD, BCPS Please check AMION for all [facility] contact numbers Clinical Pharmacist 04/02/2019 10:55 AM

## 2019-04-02 NOTE — Progress Notes (Signed)
eLink Physician-Brief Progress Note Patient Name: Ashley Moran DOB: [birth year] MRN: [id number]   Date of Service  04/02/2019  HPI/Events of Note  ABG 7.23/79.3/102/33.8  This was drawn on PRVC 24x340, PEEP 10, FiO2 0.7.  eICU Interventions  Asked RT to increase rate to 26 (or 28 if tolerated), and decrease FiO2 to 0.6.     Intervention Category Major Interventions: Respiratory failure - evaluation and management  Ashley Moran 04/02/2019, 4:51 AM

## 2019-04-02 NOTE — Progress Notes (Addendum)
Inpatient Diabetes Program Recommendations  AACE/ADA: New Consensus Statement on Inpatient Glycemic Control (2015)  Target Ranges:  Prepandial:   less than 140 mg/dL      Peak postprandial:   less than 180 mg/dL (1-2 hours)      Critically ill patients:  140 - 180 mg/dL   Lab Results  Component Value Date   GLUCAP 189 (H) 04/02/2019   HGBA1C 11.9 (H) 03/23/2019    Review of Glycemic Control Results for ADALINE, TREJOS (MRN [id number]) as of 04/02/2019 10:21  Ref. Range 04/02/2019 00:29 04/02/2019 03:36 04/02/2019 07:47  Glucose-Capillary Latest Ref Range: 70 - 99 mg/dL 409 (H) 735 (H) 329 (H)   Diabetes history: Type 2 DM Outpatient Diabetes medications: none Current orders for Inpatient glycemic control: Novolog 0-20 units Q4H, Levemir 10 units BID Decadron 6 mg QD  Inpatient Diabetes Program Recommendations:    Consider increasing Levemir to 12 units BID.   Thanks, Lujean Rave, MSN, RNC-OB Diabetes Coordinator [phone] (8a-5p)

## 2019-04-02 NOTE — Progress Notes (Signed)
CRITICAL VALUE ALERT  Critical Value:  ABG 7.236/79.3/102/33.8  Date & Time Notied:  04/02/2019 @ 0340  Provider Notified: Pola Corn MD  Orders Received/Actions taken:  Awaiting orders

## 2019-04-02 NOTE — Progress Notes (Signed)
RT NOTE: RT reported critical 76.5 PCO2 to MD and RN.  ABG as follows: pH 7.24, PCO2 76.5, PO2 59 and HCO3 33. RT will continue to monitor.

## 2019-04-02 NOTE — Progress Notes (Addendum)
PULMONARY / CRITICAL CARE MEDICINE   NAME:  Ashley Moran, MRN:  [id number], DOB:  [birth year], LOS: 5 ADMISSION DATE:  2019/04/18, CONSULTATION DATE:  04/28/18 REFERRING MD: Dr. Lezlie Octave  , CHIEF COMPLAINT:  Dyspnea  BRIEF HISTORY:    Ashley Moran is a 69 y.o female admitted 12/30 with covid pneumonia. Transferred to ICU on 12/31 for management of acute hypoxemic respiratory failure.    HISTORY OF PRESENT ILLNESS   Ashley Moran is a 69 y.o f with cad, pad, htn, hld, former smoker who presented on 12/30 with dyspnea, abdominal pain, bilateral leg claudication after being exposed to someone with covid19 at a birthday party on 12/15.  Required 6L via  on presentation subsequently further decompensated requiring 15L. Family Medicine teaching service transferred care to PCCM. Intubated on 03/31/19.   SIGNIFICANT PAST MEDICAL HISTORY   CAD Essential HTN Prior cigarette smoker (20-39 per day) Hyperlipidemia   SIGNIFICANT EVENTS:   Transferred to ICU 03/29/19  Left IJ CVC 03/31/19 Intubation 03/31/19  Proning 12:33am 04/01/19  STUDIES:   CT chest 12/30 multifocal pneumonia CT Renal 12/30 no acute intra-abdominopelvic pathology, fatty liver, no bowel obstruction or active inflammation  NM Pulmonary perfusion 12/30 no evidence for pulmonary embolism Abd xray 12/30 normal gas pattern  CXR 04/01/19 diffuse patchy opacities, ET tube in thoracic inlet. Progressive bilateral airspace disease.   CULTURES:  Blood Culture 12/30 no growth  4 days  Blood culture 12/30  CoNS  RPR sent pending  Respiratory culture pending Legionella 12/31 pending  ANTIMICROBIALS:  Ceftriaxone 12/30 >1/2 Azithromycin 12/30 >1/2  Vancomycin 1/2> Zosyn 1/2>  Decadron 12/30 > Remdesivir 12/30 >1/1  LINES/TUBES:   Bilateral peripheral IV  Left IJ 03/31/19 A line  CONSULTANTS:    SUBJECTIVE:  Remains sedated, prone. Off pressors  CONSTITUTIONAL: BP (!) 124/45   Pulse (!) 47   Temp 97.7 F (36.5 C)    Resp (!) 28   Wt 61.6 kg   SpO2 98%   BMI 27.43 kg/m   I/O last 3 completed shifts: In: 5310.6 [I.V.:4652.4; NG/GT:285; IV Piggyback:373.1] Out: 2705 [Urine:2705]  CVP:  [16 mmHg] 16 mmHg  Vent Mode: PRVC FiO2 (%):  [60 %-100 %] 60 % Set Rate:  [24 bmp-28 bmp] 28 bmp Vt Set:  [340 mL] 340 mL PEEP:  [10 cmH20] 10 cmH20 Plateau Pressure:  [26 cmH20-28 cmH20] 28 cmH20  PHYSICAL EXAM: Gen:      No acute distress, prone HEENT:  EOMI, sclera anicteric, ETT Neck:     No masses; no thyromegaly Lungs:    Clear to auscultation bilaterally; normal respiratory effort CV:         Regular rate and rhythm; no murmurs Abd:      + bowel sounds; soft, non-tender; no palpable masses, no distension Ext:    No edema; adequate peripheral perfusion Skin:      Warm and dry; no rash Neuro: Paralyzed, unresponsive  RESOLVED PROBLEM LIST   ASSESSMENT AND PLAN    #Moderate ARDS #Acute hyoxemic respiratory failure 2/2 COVID Pneumonia Patient with progressive respiratory failure with additional hepatic end organ damage. PF ratio 150. Consistent decrease in inflammatory markers and liver enzymes.  Continue proning, she will be turned supine later today Follow ABG and determine if we need to resume proning ARDS net ventilation Continue Vanco Zosyn for pneumonia, elevated procalcitonin Continue Decadron, remdesivir  Glucocorticoid induced hyperglycemia  Continue levemir 10u bid and ssi resistant   CAD s/p RCA stent 2019 Continue plavix 75mg  per  tube   Essential Hypertension Off pressors.  Holding outpatient medication   Best Practice / Goals of Care / Disposition.   DVT PROPHYLAXIS: Heparin SUP: Protonix  NUTRITION:  MOBILITY:  GOALS OF CARE: Full code FAMILY DISCUSSIONS: Daughter updated 1/4 DISPOSITION: ICU  LABS  Glucose Recent Labs  Lab 04/01/19 1626 04/01/19 1928 04/02/19 0029 04/02/19 0336 04/02/19 0747 04/02/19 1121  GLUCAP 152* 156* 232* 223* 189* 192*     BMET Recent Labs  Lab 04/01/19 0456 04/01/19 2135 04/02/19 0334 04/02/19 0338  NA 149* 143 147* 148*  K 5.3* 4.8 4.9 4.8  CL 111 108 110  --   CO2 31 29 27   --   BUN 28* 29* 30*  --   CREATININE 1.41* 1.43* 1.61*  --   GLUCOSE 208* 227* 257*  --     Liver Enzymes Recent Labs  Lab 03/31/19 0413 04/01/19 0456 04/02/19 0334  AST 135* 66* 22  ALT 304* 225* 147*  ALKPHOS 139* 118 80  BILITOT 0.4 0.4 0.6  ALBUMIN 2.2* 2.0* 1.9*    Electrolytes Recent Labs  Lab 03/31/19 0413 04/01/19 0456 04/01/19 2135 04/02/19 0334  CALCIUM 8.4* 8.2* 7.8* 8.1*  MG 2.2 2.0  --  1.8  PHOS 2.6 4.1  --  4.1    CBC Recent Labs  Lab 03/31/19 0413 04/01/19 0456 04/01/19 1755 04/02/19 0334 04/02/19 0338  WBC 13.9* 10.0  --  10.8*  --   HGB 9.7* 9.1* 9.9* 8.8* 10.2*  HCT 31.3* 30.5* 29.0* 30.9* 30.0*  PLT 487* 434*  --  373  --     ABG Recent Labs  Lab 04/01/19 0339 04/01/19 1755 04/02/19 0338  PHART 7.288* 7.238* 7.236*  PCO2ART 66.7* 73.7* 79.3*  PO2ART 150.0* 98.0 102.0    Coag's Recent Labs  Lab 03/31/19 0413  INR 1.2    Sepsis Markers Recent Labs  Lab 2019/04/27 1000 03/29/19 0212 03/29/19 0725 03/30/19 0015 03/31/19 0413 04/01/19 0456  LATICACIDVEN 2.9* 2.6* 1.8 1.6  --   --   PROCALCITON >150.00  --   --   --  74.74 31.55    Cardiac Enzymes No results for input(s): TROPONINI, PROBNP in the last 168 hours.  The patient is critically ill with multiple organ system failure and requires high complexity decision making for assessment and support, frequent evaluation and titration of therapies, advanced monitoring, review of radiographic studies and interpretation of complex data.   Critical Care Time devoted to patient care services, exclusive of separately billable procedures, described in this note is 35  minutes.   Marshell Garfinkel MD  Pulmonary and Critical Care Please see [email or web address] for pager details.  04/02/2019, 12:02 PM

## 2019-04-02 NOTE — Progress Notes (Signed)
Initial Nutrition Assessment  DOCUMENTATION CODES:   Not applicable  INTERVENTION:   Tube feeding can continue during prone positioning (this is NOT a contraindication to feeding). Consider post-pyloric Cortrak placement if gastric feedings not tolerated well.   Tube Feeding:  Vital AF 1.2 at 40 ml/hr Pro-Stat 30 mL BID Provides 1352 kcals, 102 g of protein and 778 mL of free water Meets 100% estimated protein needs, 105% calorie needs  Add MVI with Minerals    NUTRITION DIAGNOSIS:   Inadequate oral intake related to catabolic illness, acute illness as evidenced by NPO status.  GOAL:   Patient will meet greater than or equal to 90% of their needs  MONITOR:   Vent status, Skin, TF tolerance, Weight trends, Labs  REASON FOR ASSESSMENT:   Ventilator, Consult Enteral/tube feeding initiation and management  ASSESSMENT:   69 yo female admitted acute respiratory failure with ARDS secondary to COVID19 pneumonia requiring intubation. PMH includes CAD, HTN, HLD.  RD working remotely.  12/30 Admit 01/02 intubated 01/03 Prone  Patient is currently intubated on ventilator support, sedated, paralyzed on nimbex. Sedated with fentanyl and versed. Requiring neosynephrine MV: 9 L/min Temp (24hrs), Avg:97.5 F (36.4 C), Min:94.5 F (34.7 C), Max:98.8 F (37.1 C)   Vital High Protein started at 40 ml/hr per TF protocol yesterday but TF held due to prone position. Prone position is NOT a contraindication for TF administration. Discussed with RN and RN plans to resume TF  OG tube in good position per chest xray  Unable to obtain diet and weight history from pt at this time  Labs: sodium 148 (H), potassium wdl, CBGs 156-232 Meds: decadron, D5 at 50 ml/hr, ss novolog, levemir  Diet Order:   Diet Order    None      EDUCATION NEEDS:   Not appropriate for education at this time  Skin:  Skin Assessment: Reviewed RN Assessment  Last BM:  1/4  Height:   Ht Readings  from Last 1 Encounters:  03/13/19 4\' 11"  (1.499 m)    Weight:   Wt Readings from Last 1 Encounters:  03/31/19 61.6 kg    BMI:  Body mass index is 27.43 kg/m.  Estimated Nutritional Needs:   Kcal:  1282 kcals  Protein:  93-124 g  Fluid:  >/= 1.5   05/29/19 MS, RDN, LDN, CNSC [phone] Pager  [phone] Weekend/On-Call Pager

## 2019-04-02 NOTE — Progress Notes (Cosign Needed)
FPTS Social Progress Note   Following along socially, appreciate care by CCM and will resume care when patient stable for transfer out to floor.   Katha Cabal, DO PGY-1, [facility] 04/02/2019 1:26 PM    - Please contact intern pager [phone] as needed

## 2019-04-03 ENCOUNTER — Inpatient Hospital Stay (HOSPITAL_COMMUNITY): Payer: BC Managed Care – PPO

## 2019-04-03 LAB — POCT I-STAT 7, (LYTES, BLD GAS, ICA,H+H)
Acid-Base Excess: 4 mmol/L — ABNORMAL HIGH (ref 0.0–2.0)
Acid-Base Excess: 8 mmol/L — ABNORMAL HIGH (ref 0.0–2.0)
Acid-Base Excess: 8 mmol/L — ABNORMAL HIGH (ref 0.0–2.0)
Acid-Base Excess: 9 mmol/L — ABNORMAL HIGH (ref 0.0–2.0)
Bicarbonate: 32.7 mmol/L — ABNORMAL HIGH (ref 20.0–28.0)
Bicarbonate: 35.4 mmol/L — ABNORMAL HIGH (ref 20.0–28.0)
Bicarbonate: 37.5 mmol/L — ABNORMAL HIGH (ref 20.0–28.0)
Bicarbonate: 37.4 mmol/L — ABNORMAL HIGH (ref 20.0–28.0)
Calcium, Ion: 1.29 mmol/L (ref 1.15–1.40)
Calcium, Ion: 1.3 mmol/L (ref 1.15–1.40)
Calcium, Ion: 1.31 mmol/L (ref 1.15–1.40)
Calcium, Ion: 1.3 mmol/L (ref 1.15–1.40)
HCT: 30 % — ABNORMAL LOW (ref 36.0–46.0)
HCT: 26 % — ABNORMAL LOW (ref 36.0–46.0)
HCT: 31 % — ABNORMAL LOW (ref 36.0–46.0)
HCT: 29 % — ABNORMAL LOW (ref 36.0–46.0)
Hemoglobin: 10.2 g/dL — ABNORMAL LOW (ref 12.0–15.0)
Hemoglobin: 8.8 g/dL — ABNORMAL LOW (ref 12.0–15.0)
Hemoglobin: 10.5 g/dL — ABNORMAL LOW (ref 12.0–15.0)
Hemoglobin: 9.9 g/dL — ABNORMAL LOW (ref 12.0–15.0)
O2 Saturation: 95 %
O2 Saturation: 95 %
O2 Saturation: 83 %
O2 Saturation: 85 %
Patient temperature: 36.8
Patient temperature: 97.7
Patient temperature: 97.3
Potassium: 4.5 mmol/L (ref 3.5–5.1)
Potassium: 4.7 mmol/L (ref 3.5–5.1)
Potassium: 4.3 mmol/L (ref 3.5–5.1)
Potassium: 4.4 mmol/L (ref 3.5–5.1)
Sodium: 148 mmol/L — ABNORMAL HIGH (ref 135–145)
Sodium: 147 mmol/L — ABNORMAL HIGH (ref 135–145)
Sodium: 147 mmol/L — ABNORMAL HIGH (ref 135–145)
Sodium: 146 mmol/L — ABNORMAL HIGH (ref 135–145)
TCO2: 35 mmol/L — ABNORMAL HIGH (ref 22–32)
TCO2: 38 mmol/L — ABNORMAL HIGH (ref 22–32)
TCO2: 40 mmol/L — ABNORMAL HIGH (ref 22–32)
TCO2: 40 mmol/L — ABNORMAL HIGH (ref 22–32)
pCO2 arterial: 74.2 mmHg (ref 32.0–48.0)
pCO2 arterial: 69.3 mmHg (ref 32.0–48.0)
pCO2 arterial: 85.1 mmHg (ref 32.0–48.0)
pCO2 arterial: 72.8 mmHg (ref 32.0–48.0)
pH, Arterial: 7.251 — ABNORMAL LOW (ref 7.350–7.450)
pH, Arterial: 7.314 — ABNORMAL LOW (ref 7.350–7.450)
pH, Arterial: 7.252 — ABNORMAL LOW (ref 7.350–7.450)
pH, Arterial: 7.315 — ABNORMAL LOW (ref 7.350–7.450)
pO2, Arterial: 91 mmHg (ref 83.0–108.0)
pO2, Arterial: 82 mmHg — ABNORMAL LOW (ref 83.0–108.0)
pO2, Arterial: 58 mmHg — ABNORMAL LOW (ref 83.0–108.0)
pO2, Arterial: 54 mmHg — ABNORMAL LOW (ref 83.0–108.0)

## 2019-04-03 LAB — BASIC METABOLIC PANEL
Anion gap: 6 (ref 5–15)
BUN: 31 mg/dL — ABNORMAL HIGH (ref 8–23)
CO2: 31 mmol/L (ref 22–32)
Calcium: 8.3 mg/dL — ABNORMAL LOW (ref 8.9–10.3)
Chloride: 109 mmol/L (ref 98–111)
Creatinine, Ser: 1.48 mg/dL — ABNORMAL HIGH (ref 0.44–1.00)
GFR calc Af Amer: 42 mL/min — ABNORMAL LOW (ref >60)
GFR calc non Af Amer: 36 mL/min — ABNORMAL LOW (ref >60)
Glucose, Bld: 257 mg/dL — ABNORMAL HIGH (ref 70–99)
Potassium: 4.8 mmol/L (ref 3.5–5.1)
Sodium: 146 mmol/L — ABNORMAL HIGH (ref 135–145)

## 2019-04-03 LAB — PHOSPHORUS: Phosphorus: 2.8 mg/dL (ref 2.5–4.6)

## 2019-04-03 LAB — CBC
HCT: 30.4 % — ABNORMAL LOW (ref 36.0–46.0)
Hemoglobin: 8.5 g/dL — ABNORMAL LOW (ref 12.0–15.0)
MCH: 21.5 pg — ABNORMAL LOW (ref 26.0–34.0)
MCHC: 28 g/dL — ABNORMAL LOW (ref 30.0–36.0)
MCV: 76.8 fL — ABNORMAL LOW (ref 80.0–100.0)
Platelets: 344 10*3/uL (ref 150–400)
RBC: 3.96 MIL/uL (ref 3.87–5.11)
RDW: 15.4 % (ref 11.5–15.5)
WBC: 11.6 10*3/uL — ABNORMAL HIGH (ref 4.0–10.5)
nRBC: 0.3 % — ABNORMAL HIGH (ref 0.0–0.2)

## 2019-04-03 LAB — GLUCOSE, CAPILLARY
Glucose-Capillary: 246 mg/dL — ABNORMAL HIGH (ref 70–99)
Glucose-Capillary: 176 mg/dL — ABNORMAL HIGH (ref 70–99)
Glucose-Capillary: 215 mg/dL — ABNORMAL HIGH (ref 70–99)
Glucose-Capillary: 274 mg/dL — ABNORMAL HIGH (ref 70–99)
Glucose-Capillary: 373 mg/dL — ABNORMAL HIGH (ref 70–99)
Glucose-Capillary: 325 mg/dL — ABNORMAL HIGH (ref 70–99)

## 2019-04-03 LAB — MAGNESIUM: Magnesium: 1.9 mg/dL (ref 1.7–2.4)

## 2019-04-03 MED ORDER — SODIUM CHLORIDE 0.45 % IV SOLN: INTRAVENOUS | Status: DC

## 2019-04-03 MED ORDER — INSULIN DETEMIR 100 UNIT/ML ~~LOC~~ SOLN
12.0000 [IU] | Freq: Two times a day (BID) | SUBCUTANEOUS | Status: DC
  Administered 2019-04-03 (×2): 12 [IU] via SUBCUTANEOUS
  Filled 2019-04-03 (×5): qty 0.12

## 2019-04-03 MED ORDER — FUROSEMIDE 10 MG/ML IJ SOLN
40.0000 mg | Freq: Once | INTRAMUSCULAR | Status: AC
  Administered 2019-04-03: 40 mg via INTRAVENOUS
  Filled 2019-04-03: qty 4

## 2019-04-03 NOTE — Progress Notes (Addendum)
PULMONARY / CRITICAL CARE MEDICINE   NAME:  Ashley Moran, MRN:  [id number], DOB:  08/30/1950, LOS: 6 ADMISSION DATE:  2019/04/13, CONSULTATION DATE:  04/28/18 REFERRING MD: Dr. Lezlie Octave  , CHIEF COMPLAINT:  Dyspnea  BRIEF HISTORY:    Ashley Moran is a 69 y.o f with cad, pad, htn, hld, former smoker who presented on 12/30 with dyspnea, abdominal pain, bilateral leg claudication after being exposed to someone with covid19 at a birthday party on 12/15.  Required 6L via [place] on presentation subsequently further decompensated requiring 15L. Family Medicine teaching service transferred care to PCCM. Intubated on 03/31/19.   SIGNIFICANT PAST MEDICAL HISTORY   CAD Essential HTN Prior cigarette smoker (20-39 per day) Hyperlipidemia   SIGNIFICANT EVENTS:   12/31 Transferred to ICU 1/3 Proning   STUDIES:   CT chest 12/30 multifocal pneumonia CT Renal 12/30 no acute intra-abdominopelvic pathology, fatty liver, no bowel obstruction or active inflammation  NM Pulmonary perfusion 12/30 no evidence for pulmonary embolism Abd xray 12/30 normal gas pattern  CXR 04/01/19 diffuse patchy opacities, ET tube in thoracic inlet. Progressive bilateral airspace disease.   CULTURES:  Blood Culture 12/30 no growth  4 days  Blood culture 12/30  CoNS  RPR sent pending  Respiratory culture pending Legionella 12/31 pending  ANTIMICROBIALS:  Ceftriaxone 12/30 >1/2 Azithromycin 12/30 >1/2  Vancomycin 1/2> Zosyn 1/2>  Decadron 12/30 > Remdesivir 12/30 >1/1  LINES/TUBES:  Left IJ CVC 03/31/19 > Intubation 03/31/19 > A line 03/31/19 >  CONSULTANTS:    SUBJECTIVE:  Remains sedated, prone Off pressors  CONSTITUTIONAL: BP (!) 128/51   Pulse 64   Temp 98.2 F (36.8 C)   Resp (!) 28   Wt 61.6 kg   SpO2 100%   BMI 27.43 kg/m   I/O last 3 completed shifts: In: 5168.3 [I.V.:3839; Other:165; NG/GT:678.7; IV Piggyback:485.6] Out: 2335 [Urine:2305; Emesis/NG output:30]     Vent Mode: PRVC FiO2  (%):  [60 %-80 %] 70 % Set Rate:  [28 bmp] 28 bmp Vt Set:  [340 mL] 340 mL PEEP:  [10 cmH20] 10 cmH20 Plateau Pressure:  [24 cmH20-28 cmH20] 26 cmH20  PHYSICAL EXAM:. Gen:      No acute distress HEENT:  EOMI, sclera anicteric, ETT Neck:     No masses; no thyromegaly Lungs:    Clear to auscultation bilaterally; normal respiratory effort CV:         Regular rate and rhythm; no murmurs Abd:      + bowel sounds; soft, non-tender; no palpable masses, no distension Ext:    No edema; adequate peripheral perfusion Skin:      Warm and dry; no rash Neuro: Paralyzed, unresponsive  RESOLVED PROBLEM LIST   ASSESSMENT AND PLAN    Severe ARDS Acute hyoxemic respiratory failure 2/2 COVID Pneumonia Continue proning as long as P/F ratio remains less than 150 ARDS net ventilation Maintain driving pressure of < 15 DC nimbex Continue Vanco Zosyn for pneumonia, elevated procalcitonin Continue Decadron, remdesivir Lasix 40 mg X 1  Glucocorticoid induced hyperglycemia  Continue levemir 10u bid and ssi resistant   CAD s/p RCA stent 2019 Continue plavix 75mg  per tube   Essential Hypertension Off pressors.  Holding outpatient medication  Best Practice / Goals of Care / Disposition.   DVT PROPHYLAXIS: Heparin SUP: Protonix  NUTRITION:  MOBILITY:  GOALS OF CARE: Full code FAMILY DISCUSSIONS: Daughter updated on telephone 1/5 DISPOSITION: ICU  LABS  Glucose Recent Labs  Lab 04/02/19 1121 04/02/19 1537 04/02/19 1940 04/02/19  2321 04/03/19 0306 04/03/19 0755  GLUCAP 192* 190* 197* 269* 246* 176*    BMET Recent Labs  Lab 04/01/19 2135 04/02/19 0334 04/03/19 0128 04/03/19 0306 04/03/19 0529  NA 143 147* 148* 146* 147*  K 4.8 4.9 4.5 4.8 4.7  CL 108 110  --  109  --   CO2 29 27  --  31  --   BUN 29* 30*  --  31*  --   CREATININE 1.43* 1.61*  --  1.48*  --   GLUCOSE 227* 257*  --  257*  --     Liver Enzymes Recent Labs  Lab 03/31/19 0413 04/01/19 0456 04/02/19 0334   AST 135* 66* 22  ALT 304* 225* 147*  ALKPHOS 139* 118 80  BILITOT 0.4 0.4 0.6  ALBUMIN 2.2* 2.0* 1.9*    Electrolytes Recent Labs  Lab 04/01/19 0456 04/01/19 2135 04/02/19 0334 04/03/19 0306  CALCIUM 8.2* 7.8* 8.1* 8.3*  MG 2.0  --  1.8 1.9  PHOS 4.1  --  4.1 2.8    CBC Recent Labs  Lab 04/01/19 0456 04/02/19 0334 04/03/19 0128 04/03/19 0306 04/03/19 0529  WBC 10.0 10.8*  --  11.6*  --   HGB 9.1* 8.8* 10.2* 8.5* 8.8*  HCT 30.5* 30.9* 30.0* 30.4* 26.0*  PLT 434* 373  --  344  --     ABG Recent Labs  Lab 04/02/19 1804 04/03/19 0128 04/03/19 0529  PHART 7.243* 7.251* 7.314*  PCO2ART 76.5* 74.2* 69.3*  PO2ART 59.0* 91.0 82.0*    Coag's Recent Labs  Lab 03/31/19 0413  INR 1.2    Sepsis Markers Recent Labs  Lab 03/05/2019 1000 03/29/19 0212 03/29/19 0725 03/30/19 0015 03/31/19 0413 04/01/19 0456  LATICACIDVEN 2.9* 2.6* 1.8 1.6  --   --   PROCALCITON >150.00  --   --   --  74.74 31.55    Cardiac Enzymes No results for input(s): TROPONINI, PROBNP in the last 168 hours.  The patient is critically ill with multiple organ system failure and requires high complexity decision making for assessment and support, frequent evaluation and titration of therapies, advanced monitoring, review of radiographic studies and interpretation of complex data.   Critical Care Time devoted to patient care services, exclusive of separately billable procedures, described in this note is 35   minutes.   Ashley Garfinkel MD [facility] Please see [email or web address] for pager details.  04/03/2019, 9:41 AM

## 2019-04-03 NOTE — Progress Notes (Signed)
Pt proned at midnight per order. Immediately after proning pt O2 sats went from 95% to 85-86%. ETT still at 22cm at the lips and pulse ox site changed. O2 sat had a good wave form. No secretions suctioned from ETT.   FIO2 increased from .6 to .8 and O2 sats now reading 95-97%. ELINK called and made aware.

## 2019-04-03 NOTE — Progress Notes (Signed)
eLink Physician-Brief Progress Note Patient Name: Ashley Moran DOB: 1950-09-18 MRN: [id number]   Date of Service  04/03/2019  HPI/Events of Note  Multiple issues: 1. ABG on 60%/PRVC 28/TV 340/P 10 = 7.25/85.1/58.0 and 2. Hyperglycemia - Blood glucose = 373 on Levemir and Q 4 hour resistant Novolog SSI.  eICU Interventions  Will order: 1. Increase FiO2 to 70%, increase PRVC to 32 and increase PEEP to 12.  2. Repeat ABG at 10 PM.  3. Change D5 LR to 0.45 NaCl at 50 mL/lhour.      Intervention Category Major Interventions: Hypoxemia - evaluation and management;Acid-Base disturbance - evaluation and management;Respiratory failure - evaluation and management;Hyperglycemia - active titration of insulin therapy  [name] 04/03/2019, 8:37 PM

## 2019-04-03 NOTE — Progress Notes (Signed)
Inpatient Diabetes Program Recommendations  AACE/ADA: New Consensus Statement on Inpatient Glycemic Control (2015)  Target Ranges:  Prepandial:   less than 140 mg/dL      Peak postprandial:   less than 180 mg/dL (1-2 hours)      Critically ill patients:  140 - 180 mg/dL   Lab Results  Component Value Date   GLUCAP 176 (H) 04/03/2019   HGBA1C 11.9 (H) 03/05/2019    Review of Glycemic Control Results for RHEBA, DIAMOND (MRN [id number]) as of 04/03/2019 09:14  Ref. Range 04/02/2019 07:47 04/02/2019 11:21 04/02/2019 15:37 04/02/2019 19:40 04/02/2019 23:21 04/03/2019 03:06 04/03/2019 07:55  Glucose-Capillary Latest Ref Range: 70 - 99 mg/dL 929 (H) 090 (H) 301 (H) 197 (H) 269 (H) 246 (H) 176 (H)    Diabetes history: Type 2 DM Outpatient Diabetes medications: none Current orders for Inpatient glycemic control:  Novolog 0-20 units Q4H Levemir 10 units BID  Decadron 6 mg QD Vital AF 40 ml/hour BUN/Creat: 31/1.48  Inpatient Diabetes Program Recommendations:    Consider adding Novolog 4 units Q4 hours Tube feed Coverage.  Thanks, Christena Deem RN, MSN, BC-ADM Inpatient Diabetes Coordinator Team Pager [phone] (8a-5p)

## 2019-04-03 NOTE — Progress Notes (Signed)
RT NOTE: RT reported critical 85.1 PCO2 to Mannam, MD and RN.  ABG as follows: pH 7.25, PCO2 85.1, PO2 58 and HCO3 37.5. Per Mannam, MD pt to prone again tonight.

## 2019-04-03 NOTE — Progress Notes (Signed)
eLink Physician-Brief Progress Note Patient Name: Ashley Moran DOB: 1950/05/08 MRN: [id number]   Date of Service  04/03/2019  HPI/Events of Note  ABG: 7.3/69/82/35   eICU Interventions  No vent changes required at this time.     Intervention Category Major Interventions: Acid-Base disturbance - evaluation and management  Janae Bridgeman 04/03/2019, 5:47 AM

## 2019-04-03 NOTE — Progress Notes (Signed)
FPTS Social Progress Note   Following along socially, appreciate care by CCM and will resume care when patient stable for transfer out to floor.   Katha Cabal, DO PGY-1, [facility] 04/03/2019 1:18 PM    - Please contact intern pager [phone] as needed

## 2019-04-04 LAB — QUANTIFERON-TB GOLD PLUS (RQFGPL)
QuantiFERON Mitogen Value: 0.06 IU/mL
QuantiFERON Nil Value: 0.06 IU/mL
QuantiFERON TB1 Ag Value: 0.07 IU/mL
QuantiFERON TB2 Ag Value: 0.06 IU/mL

## 2019-04-04 LAB — POCT I-STAT 7, (LYTES, BLD GAS, ICA,H+H)
Acid-Base Excess: 11 mmol/L — ABNORMAL HIGH (ref 0.0–2.0)
Bicarbonate: 38.6 mmol/L — ABNORMAL HIGH (ref 20.0–28.0)
Calcium, Ion: 1.26 mmol/L (ref 1.15–1.40)
HCT: 30 % — ABNORMAL LOW (ref 36.0–46.0)
Hemoglobin: 10.2 g/dL — ABNORMAL LOW (ref 12.0–15.0)
O2 Saturation: 82 %
Patient temperature: 36.7
Potassium: 4.6 mmol/L (ref 3.5–5.1)
Sodium: 148 mmol/L — ABNORMAL HIGH (ref 135–145)
TCO2: 41 mmol/L — ABNORMAL HIGH (ref 22–32)
pCO2 arterial: 68.8 mmHg (ref 32.0–48.0)
pH, Arterial: 7.355 (ref 7.350–7.450)
pO2, Arterial: 50 mmHg — ABNORMAL LOW (ref 83.0–108.0)

## 2019-04-04 LAB — CBC
HCT: 29.7 % — ABNORMAL LOW (ref 36.0–46.0)
Hemoglobin: 8.7 g/dL — ABNORMAL LOW (ref 12.0–15.0)
MCH: 22.1 pg — ABNORMAL LOW (ref 26.0–34.0)
MCHC: 29.3 g/dL — ABNORMAL LOW (ref 30.0–36.0)
MCV: 75.6 fL — ABNORMAL LOW (ref 80.0–100.0)
Platelets: 276 10*3/uL (ref 150–400)
RBC: 3.93 MIL/uL (ref 3.87–5.11)
RDW: 15 % (ref 11.5–15.5)
WBC: 9.8 10*3/uL (ref 4.0–10.5)
nRBC: 0 % (ref 0.0–0.2)

## 2019-04-04 LAB — BASIC METABOLIC PANEL
Anion gap: 6 (ref 5–15)
BUN: 39 mg/dL — ABNORMAL HIGH (ref 8–23)
CO2: 34 mmol/L — ABNORMAL HIGH (ref 22–32)
Calcium: 8.5 mg/dL — ABNORMAL LOW (ref 8.9–10.3)
Chloride: 108 mmol/L (ref 98–111)
Creatinine, Ser: 1.2 mg/dL — ABNORMAL HIGH (ref 0.44–1.00)
GFR calc Af Amer: 54 mL/min — ABNORMAL LOW (ref >60)
GFR calc non Af Amer: 47 mL/min — ABNORMAL LOW (ref >60)
Glucose, Bld: 281 mg/dL — ABNORMAL HIGH (ref 70–99)
Potassium: 4.6 mmol/L (ref 3.5–5.1)
Sodium: 148 mmol/L — ABNORMAL HIGH (ref 135–145)

## 2019-04-04 LAB — GLUCOSE, CAPILLARY
Glucose-Capillary: 287 mg/dL — ABNORMAL HIGH (ref 70–99)
Glucose-Capillary: 262 mg/dL — ABNORMAL HIGH (ref 70–99)
Glucose-Capillary: 246 mg/dL — ABNORMAL HIGH (ref 70–99)
Glucose-Capillary: 231 mg/dL — ABNORMAL HIGH (ref 70–99)
Glucose-Capillary: 205 mg/dL — ABNORMAL HIGH (ref 70–99)
Glucose-Capillary: 250 mg/dL — ABNORMAL HIGH (ref 70–99)

## 2019-04-04 LAB — QUANTIFERON-TB GOLD PLUS: QuantiFERON-TB Gold Plus: UNDETERMINED — AB

## 2019-04-04 LAB — MAGNESIUM: Magnesium: 2 mg/dL (ref 1.7–2.4)

## 2019-04-04 LAB — PHOSPHORUS: Phosphorus: 2.6 mg/dL (ref 2.5–4.6)

## 2019-04-04 LAB — POCT ACTIVATED CLOTTING TIME: Activated Clotting Time: 0 seconds

## 2019-04-04 MED ORDER — FUROSEMIDE 10 MG/ML IJ SOLN
40.0000 mg | Freq: Once | INTRAMUSCULAR | Status: AC
  Administered 2019-04-04: 40 mg via INTRAVENOUS
  Filled 2019-04-04: qty 4

## 2019-04-04 MED ORDER — INSULIN DETEMIR 100 UNIT/ML ~~LOC~~ SOLN
15.0000 [IU] | Freq: Two times a day (BID) | SUBCUTANEOUS | Status: DC
  Administered 2019-04-04 – 2019-04-09 (×11): 15 [IU] via SUBCUTANEOUS
  Filled 2019-04-04 (×14): qty 0.15

## 2019-04-04 MED ORDER — HYDRALAZINE HCL 20 MG/ML IJ SOLN
10.0000 mg | INTRAMUSCULAR | Status: DC | PRN
  Administered 2019-04-07: 22:00:00 20 mg via INTRAVENOUS
  Filled 2019-04-04: qty 1

## 2019-04-04 NOTE — Progress Notes (Signed)
PULMONARY / CRITICAL CARE MEDICINE   NAME:  Ashley Moran, MRN:  [id number], DOB:  04/02/1950, LOS: 7 ADMISSION DATE:  04/07/2019, CONSULTATION DATE:  04/28/18 REFERRING MD: Dr. Maia Moran  , CHIEF COMPLAINT:  Dyspnea  BRIEF HISTORY:    Ashley Moran is a 69 y.o f with cad, pad, htn, hld, former smoker who presented on 12/30 with dyspnea, abdominal pain, bilateral leg claudication after being exposed to someone with covid19 at a birthday party on 12/15.  Required 6L via [place] on presentation subsequently further decompensated requiring 15L. Family Medicine teaching service transferred care to PCCM. Intubated on 03/31/19.   SIGNIFICANT PAST MEDICAL HISTORY   CAD Essential HTN Prior cigarette smoker (20-39 per day) Hyperlipidemia   SIGNIFICANT EVENTS:   12/31 Transferred to ICU 1/3 Proning  STUDIES:   CT chest 12/30 multifocal pneumonia CT Renal 12/30 no acute intra-abdominopelvic pathology, fatty liver, no bowel obstruction or active inflammation  NM Pulmonary perfusion 12/30 no evidence for pulmonary embolism Abd xray 12/30 normal gas pattern  CXR 04/01/19 diffuse patchy opacities, ET tube in thoracic inlet. Progressive bilateral airspace disease.   CULTURES:  Blood Culture 12/30 no growth  4 days  Blood culture 12/30  CoNS  RPR sent pending  Respiratory culture pending Legionella 12/31 neg  ANTIMICROBIALS:  Ceftriaxone 12/30 >1/2 Azithromycin 12/30 >1/2  Vancomycin 1/2> Zosyn 1/2>  Decadron 12/30 > Remdesivir 12/30 >1/1  LINES/TUBES:  Left IJ CVC 03/31/19 > Intubation 03/31/19 > A line 03/31/19 >  CONSULTANTS:    SUBJECTIVE:  Escalating vent settings overnight.   CONSTITUTIONAL: BP (!) 121/50   Pulse 68   Temp 97.7 F (36.5 C)   Resp (!) 32   Wt 61.7 kg   SpO2 96%   BMI 27.47 kg/m   I/O last 3 completed shifts: In: 6243.9 [I.V.:3302.8; NG/GT:2642.7; IV Piggyback:298.4] Out: 4625 [Urine:4625]     Vent Mode: PRVC FiO2 (%):  [60 %-70 %] 60 % Set Rate:   [28 bmp-32 bmp] 32 bmp Vt Set:  [340 mL] 340 mL PEEP:  [10 cmH20-12 cmH20] 12 cmH20 Plateau Pressure:  [27 URK27-06 cmH20] 28 cmH20  PHYSICAL EXAM:. Gen:      Elderly female on vent HEENT:  [place]/AT, PERRL, no JVD Lungs:   Clear to auscultation bilaterally.  CV:         Regular rate and rhythm; no murmurs Abd:     Hypoactive, soft, non-distended Ext:    No edema. NO acute deformity Skin:      Warm and dry; no rash Neuro:  Sedated RASS -4.   RESOLVED PROBLEM LIST    ASSESSMENT AND PLAN    Severe ARDS Acute hyoxemic respiratory failure 2/2 COVID Pneumonia Continue proning as long as P/F ratio remains less than 150 ARDS net ventilation Maintain driving pressure of < 15  Continue Vanco Zosyn for pneumonia, elevated procalcitonin Continue Decadron, remdesivir Continued diuresis Lasix 40 mg PEEP 12 and Plat 30 this morning.  Check ABG  Glucocorticoid induced hyperglycemia  Increase levemir to 15u bid ssi resistant   CAD s/p RCA stent 2019 Continue plavix 75mg  per tube   Essential Hypertension Off pressors.  Holding outpatient medication PRN hydralazine  Best Practice / Goals of Care / Disposition.   DVT PROPHYLAXIS: Heparin SUP: Protonix  NUTRITION: TF MOBILITY: BR GOALS OF CARE: Full code FAMILY DISCUSSIONS: Will update daughter DISPOSITION: ICU  LABS  Glucose Recent Labs  Lab 04/03/19 1134 04/03/19 1528 04/03/19 1952 04/03/19 2325 04/04/19 0339 04/04/19 0748  GLUCAP 215*  274* 373* 325* 287* 262*    BMET Recent Labs  Lab 04/01/19 2135 04/02/19 0334 04/03/19 0306 04/03/19 0529 04/03/19 1808 04/03/19 2224  NA 143 147* 146* 147* 147* 146*  K 4.8 4.9 4.8 4.7 4.3 4.4  CL 108 110 109  --   --   --   CO2 29 27 31   --   --   --   BUN 29* 30* 31*  --   --   --   CREATININE 1.43* 1.61* 1.48*  --   --   --   GLUCOSE 227* 257* 257*  --   --   --     Liver Enzymes Recent Labs  Lab 03/31/19 0413 04/01/19 0456 04/02/19 0334  AST 135* 66* 22  ALT 304*  225* 147*  ALKPHOS 139* 118 80  BILITOT 0.4 0.4 0.6  ALBUMIN 2.2* 2.0* 1.9*    Electrolytes Recent Labs  Lab 04/01/19 2135 04/02/19 0334 04/03/19 0306 04/04/19 0414  CALCIUM 7.8* 8.1* 8.3*  --   MG  --  1.8 1.9 2.0  PHOS  --  4.1 2.8 2.6    CBC Recent Labs  Lab 04/02/19 0334 04/03/19 0306 04/03/19 1808 04/03/19 2224 04/04/19 0414  WBC 10.8* 11.6*  --   --  9.8  HGB 8.8* 8.5* 10.5* 9.9* 8.7*  HCT 30.9* 30.4* 31.0* 29.0* 29.7*  PLT 373 344  --   --  276    ABG Recent Labs  Lab 04/03/19 0529 04/03/19 1808 04/03/19 2224  PHART 7.314* 7.252* 7.315*  PCO2ART 69.3* 85.1* 72.8*  PO2ART 82.0* 58.0* 54.0*    Coag's Recent Labs  Lab 03/31/19 0413  INR 1.2    Sepsis Markers Recent Labs  Lab 2019/04/06 1000 03/29/19 0212 03/29/19 0725 03/30/19 0015 03/31/19 0413 04/01/19 0456  LATICACIDVEN 2.9* 2.6* 1.8 1.6  --   --   PROCALCITON >150.00  --   --   --  74.74 31.55    Cardiac Enzymes No results for input(s): TROPONINI, PROBNP in the last 168 hours.  CRITICAL CARE Performed by: 05/30/19   Total critical care time: 30  minutes  Critical care time was exclusive of separately billable procedures and treating other patients.  Critical care was necessary to treat or prevent imminent or life-threatening deterioration.  Critical care was time spent personally by me on the following activities: development of treatment plan with patient and/or surrogate as well as nursing, discussions with consultants, evaluation of patient's response to treatment, examination of patient, obtaining history from patient or surrogate, ordering and performing treatments and interventions, ordering and review of laboratory studies, ordering and review of radiographic studies, pulse oximetry and re-evaluation of patient's condition.   Ashley Moran, AGACNP-BC [facility]  See Amion for personal pager PCCM on call pager [phone]  04/04/2019 8:51  AM

## 2019-04-04 NOTE — Progress Notes (Signed)
Elink made aware of pt's recent ABG values.  No new orders at this time. Will continue to monitor.

## 2019-04-04 NOTE — Progress Notes (Signed)
FPTS Social Progress Note   Following along socially, appreciate care by CCM and will resume care when patient stable for transfer out to floor.   Katha Cabal, DO PGY-1, [facility] 04/04/2019 1:24 PM    - Please contact intern pager [phone] as needed

## 2019-04-04 NOTE — Progress Notes (Signed)
Inpatient Diabetes Program Recommendations  AACE/ADA: New Consensus Statement on Inpatient Glycemic Control (2015)  Target Ranges:  Prepandial:   less than 140 mg/dL      Peak postprandial:   less than 180 mg/dL (1-2 hours)      Critically ill patients:  140 - 180 mg/dL   Lab Results  Component Value Date   GLUCAP 262 (H) 04/04/2019   HGBA1C 11.9 (H) 03/06/2019    Review of Glycemic Control  Results for GWENDALYNN, ECKSTROM (MRN [id number]) as of 04/04/2019 08:37  Ref. Range 04/03/2019 07:55 04/03/2019 11:34 04/03/2019 15:28 04/03/2019 19:52 04/03/2019 23:25 04/04/2019 03:39 04/04/2019 07:48  Glucose-Capillary Latest Ref Range: 70 - 99 mg/dL 833 (H) 825 (H) 053 (H) 373 (H) 325 (H) 287 (H) 262 (H)    Diabetes history: Type 2 DM Outpatient Diabetes medications: none Current orders for Inpatient glycemic control:  Novolog 0-20 units Q4H Levemir 10 units BID  Decadron 6 mg QD Vital AF 40 ml/hour BUN/Creat: 31/1.48  Inpatient Diabetes Program Recommendations:    Consider increasing Levemir to 15 units BID.  Consider adding Novolog 6 units Q4 hours Tube feed Coverage.  Thanks, Christena Deem RN, MSN, BC-ADM Inpatient Diabetes Coordinator Team Pager [phone] (8a-5p)

## 2019-04-04 NOTE — Progress Notes (Signed)
Patient unable to tolerate being proned with paralytic and now paralytic has been turned off since 04/03/19 @1830  on previous shift.  ABG sent and is somewhat better.  Will continue to monitor patient closely. Charge nurse and Elink made aware

## 2019-04-05 ENCOUNTER — Inpatient Hospital Stay (HOSPITAL_COMMUNITY): Payer: BC Managed Care – PPO

## 2019-04-05 DIAGNOSIS — J1282 Pneumonia due to coronavirus disease 2019: Secondary | ICD-10-CM

## 2019-04-05 LAB — CBC
HCT: 28.9 % — ABNORMAL LOW (ref 36.0–46.0)
Hemoglobin: 8.3 g/dL — ABNORMAL LOW (ref 12.0–15.0)
MCH: 21.3 pg — ABNORMAL LOW (ref 26.0–34.0)
MCHC: 28.7 g/dL — ABNORMAL LOW (ref 30.0–36.0)
MCV: 74.3 fL — ABNORMAL LOW (ref 80.0–100.0)
Platelets: 237 10*3/uL (ref 150–400)
RBC: 3.89 MIL/uL (ref 3.87–5.11)
RDW: 14.8 % (ref 11.5–15.5)
WBC: 14.5 10*3/uL — ABNORMAL HIGH (ref 4.0–10.5)
nRBC: 0.1 % (ref 0.0–0.2)

## 2019-04-05 LAB — POCT I-STAT 7, (LYTES, BLD GAS, ICA,H+H)
Acid-Base Excess: 12 mmol/L — ABNORMAL HIGH (ref 0.0–2.0)
Bicarbonate: 38.6 mmol/L — ABNORMAL HIGH (ref 20.0–28.0)
Calcium, Ion: 1.28 mmol/L (ref 1.15–1.40)
HCT: 30 % — ABNORMAL LOW (ref 36.0–46.0)
Hemoglobin: 10.2 g/dL — ABNORMAL LOW (ref 12.0–15.0)
O2 Saturation: 89 %
Patient temperature: 36.9
Potassium: 3.6 mmol/L (ref 3.5–5.1)
Sodium: 146 mmol/L — ABNORMAL HIGH (ref 135–145)
TCO2: 41 mmol/L — ABNORMAL HIGH (ref 22–32)
pCO2 arterial: 64.2 mmHg — ABNORMAL HIGH (ref 32.0–48.0)
pH, Arterial: 7.387 (ref 7.350–7.450)
pO2, Arterial: 59 mmHg — ABNORMAL LOW (ref 83.0–108.0)

## 2019-04-05 LAB — BASIC METABOLIC PANEL
Anion gap: 7 (ref 5–15)
BUN: 48 mg/dL — ABNORMAL HIGH (ref 8–23)
CO2: 34 mmol/L — ABNORMAL HIGH (ref 22–32)
Calcium: 8.3 mg/dL — ABNORMAL LOW (ref 8.9–10.3)
Chloride: 106 mmol/L (ref 98–111)
Creatinine, Ser: 1.17 mg/dL — ABNORMAL HIGH (ref 0.44–1.00)
GFR calc Af Amer: 56 mL/min — ABNORMAL LOW (ref >60)
GFR calc non Af Amer: 48 mL/min — ABNORMAL LOW (ref >60)
Glucose, Bld: 299 mg/dL — ABNORMAL HIGH (ref 70–99)
Potassium: 4.4 mmol/L (ref 3.5–5.1)
Sodium: 147 mmol/L — ABNORMAL HIGH (ref 135–145)

## 2019-04-05 LAB — GLUCOSE, CAPILLARY
Glucose-Capillary: 254 mg/dL — ABNORMAL HIGH (ref 70–99)
Glucose-Capillary: 212 mg/dL — ABNORMAL HIGH (ref 70–99)
Glucose-Capillary: 188 mg/dL — ABNORMAL HIGH (ref 70–99)
Glucose-Capillary: 172 mg/dL — ABNORMAL HIGH (ref 70–99)
Glucose-Capillary: 171 mg/dL — ABNORMAL HIGH (ref 70–99)
Glucose-Capillary: 197 mg/dL — ABNORMAL HIGH (ref 70–99)

## 2019-04-05 LAB — APTT: aPTT: 38 seconds — ABNORMAL HIGH (ref 24–36)

## 2019-04-05 LAB — PROTIME-INR
INR: 1.1 (ref 0.8–1.2)
Prothrombin Time: 13.8 seconds (ref 11.4–15.2)

## 2019-04-05 MED ORDER — MORPHINE BOLUS VIA INFUSION
0.5000 mg | INTRAVENOUS | Status: DC | PRN
  Administered 2019-04-05 – 2019-04-07 (×9): 0.5 mg via INTRAVENOUS
  Filled 2019-04-05: qty 1

## 2019-04-05 MED ORDER — PHENYLEPHRINE HCL-NACL 10-0.9 MG/250ML-% IV SOLN
INTRAVENOUS | Status: AC
  Administered 2019-04-06: 20 ug/min via INTRAVENOUS
  Filled 2019-04-05: qty 250

## 2019-04-05 MED ORDER — FUROSEMIDE 10 MG/ML IJ SOLN
40.0000 mg | Freq: Four times a day (QID) | INTRAMUSCULAR | Status: AC
  Administered 2019-04-05 (×2): 40 mg via INTRAVENOUS
  Filled 2019-04-05 (×2): qty 4

## 2019-04-05 MED ORDER — INSULIN ASPART 100 UNIT/ML ~~LOC~~ SOLN
6.0000 [IU] | SUBCUTANEOUS | Status: DC
  Administered 2019-04-05 – 2019-04-07 (×15): 6 [IU] via SUBCUTANEOUS

## 2019-04-05 MED ORDER — PHENYLEPHRINE HCL-NACL 10-0.9 MG/250ML-% IV SOLN
0.0000 ug/min | INTRAVENOUS | Status: DC
  Administered 2019-04-05: 20:00:00 3 ug/min via INTRAVENOUS
  Administered 2019-04-07: 25 ug/min via INTRAVENOUS
  Administered 2019-04-07 (×2): 50 ug/min via INTRAVENOUS
  Administered 2019-04-08: 6 ug/min via INTRAVENOUS
  Administered 2019-04-08: 05:00:00 140 ug/min via INTRAVENOUS
  Administered 2019-04-08: 100 ug/min via INTRAVENOUS
  Filled 2019-04-05: qty 250
  Filled 2019-04-05: qty 500
  Filled 2019-04-05: qty 250

## 2019-04-05 MED ORDER — MORPHINE 100MG IN NS 100ML (1MG/ML) PREMIX INFUSION
0.5000 mg/h | INTRAVENOUS | Status: DC
  Administered 2019-04-05: 1 mg/h via INTRAVENOUS
  Administered 2019-04-06 (×2): 0.5 mg/h via INTRAVENOUS
  Administered 2019-04-07: 10 mg/h via INTRAVENOUS
  Administered 2019-04-07: 02:00:00 1 mg/h via INTRAVENOUS
  Administered 2019-04-08: 02:00:00 10 mg/h via INTRAVENOUS
  Filled 2019-04-05 (×4): qty 100

## 2019-04-05 NOTE — Progress Notes (Signed)
PULMONARY / CRITICAL CARE MEDICINE   NAME:  Ashley Moran, MRN:  [id number], DOB:  [birth year], LOS: 8 ADMISSION DATE:  04/01/19, CONSULTATION DATE:  04/28/18 REFERRING MD: Dr. Maia Breslow  , CHIEF COMPLAINT:  Dyspnea  BRIEF HISTORY:    Ashley Moran is a 69 y.o f with cad, pad, htn, hld, former smoker who presented on 12/30 with dyspnea, abdominal pain, bilateral leg claudication after being exposed to someone with covid19 at a birthday party on 12/15.  Required 6L via [place] on presentation subsequently further decompensated requiring 15L. Family Medicine teaching service transferred care to PCCM. Intubated on 03/31/19.   SIGNIFICANT PAST MEDICAL HISTORY   CAD Essential HTN Prior cigarette smoker (20-39 per day) Hyperlipidemia   SIGNIFICANT EVENTS:   12/31 Transferred to ICU 1/3 Proning  STUDIES:   CT chest 12/30 multifocal pneumonia CT Renal 12/30 no acute intra-abdominopelvic pathology, fatty liver, no bowel obstruction or active inflammation  NM Pulmonary perfusion 12/30 no evidence for pulmonary embolism Abd xray 12/30 normal gas pattern  CXR 04/01/19 diffuse patchy opacities, ET tube in thoracic inlet. Progressive bilateral airspace disease.   CULTURES:  Blood Culture 12/30 no growth  4 days  Blood culture 12/30  CoNS  RPR sent pending  Legionella 12/31 neg  ANTIMICROBIALS:  Ceftriaxone 12/30 >1/2 Azithromycin 12/30 >1/2  Vancomycin 1/2>1/5 Zosyn 1/2>  Decadron 12/30 > Remdesivir 12/30 >1/1  LINES/TUBES:  Left IJ CVC 03/31/19 > Intubation 03/31/19 > A line 03/31/19 >  CONSULTANTS:    SUBJECTIVE:  Stable overnight on 65% FiO2 and 12 PEEP  CONSTITUTIONAL: BP (!) 161/75   Pulse 79   Temp 97.7 F (36.5 C)   Resp (!) 24   Wt 67.8 kg   SpO2 (!) 88%   BMI 30.19 kg/m   I/O last 3 completed shifts: In: 5321.5 [I.V.:2968.6; NG/GT:2080; IV Piggyback:272.9] Out: 4265 [Urine:4265]     Vent Mode: PRVC FiO2 (%):  [60 %-80 %] 65 % Set Rate:  [31 bmp-32 bmp] 31  bmp Vt Set:  [340 mL] 340 mL PEEP:  [12 cmH20] 12 cmH20 Plateau Pressure:  [30 cmH20-31 cmH20] 31 cmH20  PHYSICAL EXAM:.  General:  Elderly appearing female on vent Neuro:  Sedated RASS -4 HEENT:  [place]/AT, No JVD noted, PERRL Cardiovascular:  RRR, no MRG Lungs:  Clear bilaterally Abdomen:  Soft, non-distended Musculoskeletal:  No acute deformity. No edema Skin:  Intact, MMM   RESOLVED PROBLEM LIST    ASSESSMENT AND PLAN    Severe ARDS Acute hyoxemic respiratory failure 2/2 COVID Pneumonia Continue proning as long as P/F ratio remains less than 150 ARDS net ventilation Maintain driving pressure of < 15  Continue Zosyn for pneumonia DC vancomycin Repeat PCT Continue Decadron for total 10 days. Completed Remdesivir course Continued diuresis Lasix 40 mg x 2 today PEEP to 10 and FIO2 to 60% If she remains stable on these settings we can hopefully attempt to wean sedation some  Glucocorticoid induced hyperglycemia  Increase levemir to 15u bid ssi resistant  Add tube feeding coverage 6 units novolog q 4 hours DM coordinator assistance appreciated  CAD s/p RCA stent 2019 Continue plavix 75mg  per tube   Essential Hypertension Off pressors.  Holding outpatient medication PRN hydralazine  Best Practice / Goals of Care / Disposition.   DVT PROPHYLAXIS: Heparin SUP: Protonix  NUTRITION: TF MOBILITY: BR GOALS OF CARE: Full code FAMILY DISCUSSIONS: Will update daughter DISPOSITION: ICU   LABS  Glucose Recent Labs  Lab 04/04/19 0748 04/04/19 1130 04/04/19 1520  04/04/19 1936 04/04/19 2315 04/05/19 0345  GLUCAP 262* 246* 231* 205* 250* 254*    BMET Recent Labs  Lab 04/03/19 0306 04/04/19 0936 04/04/19 1042 04/05/19 0313  NA 146* 148* 148* 147*  K 4.8 4.6 4.6 4.4  CL 109  --  108 106  CO2 31  --  34* 34*  BUN 31*  --  39* 48*  CREATININE 1.48*  --  1.20* 1.17*  GLUCOSE 257*  --  281* 299*    Liver Enzymes Recent Labs  Lab 03/31/19 0413  04/01/19 0456 04/02/19 0334  AST 135* 66* 22  ALT 304* 225* 147*  ALKPHOS 139* 118 80  BILITOT 0.4 0.4 0.6  ALBUMIN 2.2* 2.0* 1.9*    Electrolytes Recent Labs  Lab 04/02/19 0334 04/03/19 0306 04/04/19 0414 04/04/19 1042 04/05/19 0313  CALCIUM 8.1* 8.3*  --  8.5* 8.3*  MG 1.8 1.9 2.0  --   --   PHOS 4.1 2.8 2.6  --   --     CBC Recent Labs  Lab 04/02/19 0334 04/03/19 0306 04/03/19 2224 04/04/19 0414 04/04/19 0936  WBC 10.8* 11.6*  --  9.8  --   HGB 8.8* 8.5* 9.9* 8.7* 10.2*  HCT 30.9* 30.4* 29.0* 29.7* 30.0*  PLT 373 344  --  276  --     ABG Recent Labs  Lab 04/03/19 1808 04/03/19 2224 04/04/19 0936  PHART 7.252* 7.315* 7.355  PCO2ART 85.1* 72.8* 68.8*  PO2ART 58.0* 54.0* 50.0*    Coag's Recent Labs  Lab 03/31/19 0413  INR 1.2    Sepsis Markers Recent Labs  Lab 03/30/19 0015 03/31/19 0413 04/01/19 0456  LATICACIDVEN 1.6  --   --   PROCALCITON  --  74.74 31.55    Cardiac Enzymes No results for input(s): TROPONINI, PROBNP in the last 168 hours.  CRITICAL CARE Performed by: Duayne Cal   Total critical care time: 35  minutes  Critical care time was exclusive of separately billable procedures and treating other patients.  Critical care was necessary to treat or prevent imminent or life-threatening deterioration.  Critical care was time spent personally by me on the following activities: development of treatment plan with patient and/or surrogate as well as nursing, discussions with consultants, evaluation of patient's response to treatment, examination of patient, obtaining history from patient or surrogate, ordering and performing treatments and interventions, ordering and review of laboratory studies, ordering and review of radiographic studies, pulse oximetry and re-evaluation of patient's condition.   Joneen Roach, AGACNP-BC [facility]  See Amion for personal pager PCCM on call pager [phone]  04/05/2019  8:02 AM

## 2019-04-05 NOTE — Progress Notes (Signed)
eLink Physician-Brief Progress Note Patient Name: Ashley Moran DOB: [birth year] MRN: [id number]   Date of Service  04/05/2019  HPI/Events of Note  Nurse thinks Foley catheter is clotted off.   eICU Interventions  Irrigate Foley catheter with 0.9 NaCl X 1 now.      Intervention Category Major Interventions: Other:  [name] 04/05/2019, 11:44 PM

## 2019-04-05 NOTE — Progress Notes (Signed)
FPTS Social Progress Note   Following along socially, appreciate care by CCM and will resume care when patient stable for transfer out to floor.   Katha Cabal, DO PGY-1, [facility] 04/05/2019 3:10 PM    - Please contact intern pager [phone] as needed

## 2019-04-05 NOTE — Progress Notes (Signed)
eLink Physician-Brief Progress Note Patient Name: Ashley Moran DOB: [birth year] MRN: [id number]   Date of Service  04/05/2019  HPI/Events of Note  Hematuria - PTT = 38 seconds (normal up to 36 seconds). Heparin [place] is not the etiology of her hematuria.   eICU Interventions  Continue present management including Heparin .     Intervention Category Major Interventions: Other:  [name] 04/05/2019, 11:27 PM

## 2019-04-05 NOTE — Progress Notes (Addendum)
eLink Physician-Brief Progress Note Patient Name: Ashley Moran DOB: 02-26-1951 MRN: [id number]   Date of Service  04/05/2019  HPI/Events of Note  Multiple issues: 1. Hypotension - BP = 102/48 with MAP = 62 by cuff. 166/60 with MAP = 84 by A-line and 2. Hematuria  eICU Interventions  Will order: 1. Phenylephrine IV infusion. Titrate to MAP >= 65 (if needed). 2. CBC with Platelets, PT/INR and PTT pending..     Intervention Category Major Interventions: Hypotension - evaluation and management;Other:  [name] 04/05/2019, 7:42 PM

## 2019-04-05 NOTE — Progress Notes (Signed)
Pt. with minimal output following 40 IV lasix @ 1430. Urine noted to have started becoming bloody around 1600 and Dr. Denese Killings notified with orders to just watch. Dr. Denese Killings also notified earlier, around 10:30am about patient grimacing heavily to presses on her abdomen. Again, orders at that time to only watch patient. Now at 1800 pt. with little UOP, however even more bloody. Orders to irrigate foley and if unsuccessful then exchanging the foley would be the next option. Foley exchanged at 1845 with the patient putting of dark bloody/purple urine. Joneen Roach, NP notified and orders obtained to get CBC and PT-INR.

## 2019-04-05 NOTE — Progress Notes (Signed)
eLink Physician-Brief Progress Note Patient Name: Raelee Rossmann DOB: 1951-02-18 MRN: [id number]   Date of Service  04/05/2019  HPI/Events of Note  Hematuria - Hgb = 8.3, Platelets = 237, PT =13.8 and INR = 1.1.  eICU Interventions  Will order: 1. PTT STAT. 2. Hold Heparin [place] pending PTT results.      Intervention Category Major Interventions: Other:  [name] 04/05/2019, 9:37 PM

## 2019-04-06 ENCOUNTER — Inpatient Hospital Stay (HOSPITAL_COMMUNITY): Payer: BC Managed Care – PPO

## 2019-04-06 LAB — BASIC METABOLIC PANEL
Anion gap: 11 (ref 5–15)
BUN: 60 mg/dL — ABNORMAL HIGH (ref 8–23)
CO2: 34 mmol/L — ABNORMAL HIGH (ref 22–32)
Calcium: 8.7 mg/dL — ABNORMAL LOW (ref 8.9–10.3)
Chloride: 100 mmol/L (ref 98–111)
Creatinine, Ser: 1.35 mg/dL — ABNORMAL HIGH (ref 0.44–1.00)
GFR calc Af Amer: 47 mL/min — ABNORMAL LOW (ref >60)
GFR calc non Af Amer: 41 mL/min — ABNORMAL LOW (ref >60)
Glucose, Bld: 243 mg/dL — ABNORMAL HIGH (ref 70–99)
Potassium: 3.8 mmol/L (ref 3.5–5.1)
Sodium: 145 mmol/L (ref 135–145)

## 2019-04-06 LAB — POCT I-STAT 7, (LYTES, BLD GAS, ICA,H+H)
Acid-Base Excess: 13 mmol/L — ABNORMAL HIGH (ref 0.0–2.0)
Bicarbonate: 40 mmol/L — ABNORMAL HIGH (ref 20.0–28.0)
Calcium, Ion: 1.26 mmol/L (ref 1.15–1.40)
HCT: 28 % — ABNORMAL LOW (ref 36.0–46.0)
Hemoglobin: 9.5 g/dL — ABNORMAL LOW (ref 12.0–15.0)
O2 Saturation: 95 %
Patient temperature: 98.6
Potassium: 4.4 mmol/L (ref 3.5–5.1)
Sodium: 142 mmol/L (ref 135–145)
TCO2: 42 mmol/L — ABNORMAL HIGH (ref 22–32)
pCO2 arterial: 70.5 mmHg (ref 32.0–48.0)
pH, Arterial: 7.362 (ref 7.350–7.450)
pO2, Arterial: 83 mmHg (ref 83.0–108.0)

## 2019-04-06 LAB — CBC
HCT: 28.4 % — ABNORMAL LOW (ref 36.0–46.0)
Hemoglobin: 8.3 g/dL — ABNORMAL LOW (ref 12.0–15.0)
MCH: 21.6 pg — ABNORMAL LOW (ref 26.0–34.0)
MCHC: 29.2 g/dL — ABNORMAL LOW (ref 30.0–36.0)
MCV: 73.8 fL — ABNORMAL LOW (ref 80.0–100.0)
Platelets: 266 10*3/uL (ref 150–400)
RBC: 3.85 MIL/uL — ABNORMAL LOW (ref 3.87–5.11)
RDW: 14.8 % (ref 11.5–15.5)
WBC: 15 10*3/uL — ABNORMAL HIGH (ref 4.0–10.5)
nRBC: 0.2 % (ref 0.0–0.2)

## 2019-04-06 LAB — GLUCOSE, CAPILLARY
Glucose-Capillary: 224 mg/dL — ABNORMAL HIGH (ref 70–99)
Glucose-Capillary: 185 mg/dL — ABNORMAL HIGH (ref 70–99)
Glucose-Capillary: 158 mg/dL — ABNORMAL HIGH (ref 70–99)
Glucose-Capillary: 148 mg/dL — ABNORMAL HIGH (ref 70–99)
Glucose-Capillary: 173 mg/dL — ABNORMAL HIGH (ref 70–99)

## 2019-04-06 LAB — MAGNESIUM: Magnesium: 2 mg/dL (ref 1.7–2.4)

## 2019-04-06 LAB — PROCALCITONIN: Procalcitonin: 1.17 ng/mL

## 2019-04-06 LAB — PHOSPHORUS: Phosphorus: 3.9 mg/dL (ref 2.5–4.6)

## 2019-04-06 MED ORDER — ATORVASTATIN CALCIUM 40 MG PO TABS
40.0000 mg | ORAL_TABLET | Freq: Every day | ORAL | Status: DC
  Administered 2019-04-06 – 2019-04-09 (×4): 40 mg
  Filled 2019-04-06 (×4): qty 1

## 2019-04-06 MED ORDER — PANTOPRAZOLE SODIUM 40 MG PO PACK
40.0000 mg | PACK | ORAL | Status: DC
  Administered 2019-04-06 – 2019-04-09 (×4): 40 mg
  Filled 2019-04-06 (×4): qty 20

## 2019-04-06 MED ORDER — LACTATED RINGERS IV SOLN: INTRAVENOUS | Status: DC

## 2019-04-06 MED ORDER — ATORVASTATIN CALCIUM 40 MG PO TABS: 40.0000 mg | ORAL_TABLET | Freq: Every day | ORAL | Status: DC

## 2019-04-06 NOTE — Progress Notes (Signed)
PULMONARY / CRITICAL CARE MEDICINE   NAME:  Ashley Moran, MRN:  [id number], DOB:  [birth year], LOS: 9 ADMISSION DATE:  04-26-2019, CONSULTATION DATE:  04/28/18 REFERRING MD: Dr. Maia Breslow  , CHIEF COMPLAINT:  Dyspnea  BRIEF HISTORY:    69 yo female former smoker presented with dyspnea, abdominal pain, leg pain.  Had COVID exposure on 12/15.  Found to be COVID 19 positive with pneumonia.  Required intubation 1/02.  SIGNIFICANT PAST MEDICAL HISTORY   CAD, HTN, HLD  SIGNIFICANT EVENTS:  12/31 Transferred to ICU 01/02 VDRF 01/03 Proning  STUDIES:   CT chest 12/30 >> multifocal pneumonia V/Q scan 12/30 >> negative for PE  CULTURES:  Blood 12/30 >> Coag neg Staph  SARS CoV2 Ag >> positive Pneumococcal Ag 12/31 >> negative Legionella Ag 12/31 >> negative Quantiferon gold 1/03 >> negative  ANTIMICROBIALS:  Ceftriaxone 12/30 >1/01 Azithromycin 12/30 >1/01 Vancomycin 1/02 >> 1/04 Zosyn 1/02 >>   COVID THERAPY:  Decadron 12/30 >> Remdesivir 12/30 >> 12/31  LINES/TUBES:  ETT 1/02 >> Lt IJ CVL 1/02 >>  Rt radial A line 1/02 >>   CONSULTANTS:    SUBJECTIVE:  Remains on increased FiO2/PEEP, pressors.  OBJECTIVE:   CONSTITUTIONAL: BP (!) 117/53   Pulse 78   Temp 98.7 F (37.1 C) (Oral)   Resp (!) 36   Wt 67.1 kg   SpO2 95%   BMI 29.88 kg/m   I/O last 3 completed shifts: In: 4999.1 [I.V.:3144.3; Other:40; NG/GT:1473.7; IV Piggyback:341.1] Out: 3520 [Urine:3345; Stool:175]  CVP:  [4 mmHg-10 mmHg] 4 mmHg  Vent Mode: PRVC FiO2 (%):  [60 %-90 %] 60 % Set Rate:  [32 bmp] 32 bmp Vt Set:  [340 mL] 340 mL PEEP:  [12 cmH20] 12 cmH20 Plateau Pressure:  [25 CWC37-62 cmH20] 32 cmH20  PHYSICAL EXAM:.   General - sedated Eyes - pupils reactive ENT - ETT in place Cardiac - regular rate/rhythm, no murmur Chest - scattered rhonchi Abdomen - soft, non tender, + bowel sounds Extremities - 1+ edema Skin - no rashes Neuro - RASS -2  CXR (reviewed by me) - b/l  ASD   RESOLVED PROBLEM LIST    ASSESSMENT AND PLAN    Acute hypoxic/hypercapnic respiratory failure with ARDS from COVID 19 pneumonia and bacterial HCAP. - goal SpO2 88 to 95% - f/u CXR, ABG - continue zosyn - d/c decadron after dose on 1/08  Septic shock. - pressors to keep MAP > 65  Acute metabolic encephalopathy from sepsis, hypoxia. - RASS goal -1  Steroid induced hyperglycemia. - SSI  Hx of CAD s/p stent in 2019, HTN, HLD. - continue plavix  Anemia of critical illness. - f/u CBC - transfuse for Hb < 7  Best Practice:  DVT PROPHYLAXIS: Heparin SUP: Protonix  NUTRITION: TF MOBILITY: BR GOALS OF CARE: Full code FAMILY DISCUSSIONS: Will update daughter DISPOSITION: ICU   LABS:   CMP Latest Ref Rng & Units 04/06/2019 04/06/2019 04/05/2019  Glucose 70 - 99 mg/dL 243(H) - -  BUN 8 - 23 mg/dL 60(H) - -  Creatinine 0.44 - 1.00 mg/dL 1.35(H) - -  Sodium 135 - 145 mmol/L 145 142 146(H)  Potassium 3.5 - 5.1 mmol/L 3.8 4.4 3.6  Chloride 98 - 111 mmol/L 100 - -  CO2 22 - 32 mmol/L 34(H) - -  Calcium 8.9 - 10.3 mg/dL 8.7(L) - -  Total Protein 6.5 - 8.1 g/dL - - -  Total Bilirubin 0.3 - 1.2 mg/dL - - -  Alkaline Phos  38 - 126 U/L - - -  AST 15 - 41 U/L - - -  ALT 0 - 44 U/L - - -    CBC Latest Ref Rng & Units 04/06/2019 04/06/2019 04/05/2019  WBC 4.0 - 10.5 K/uL 15.0(H) - 14.5(H)  Hemoglobin 12.0 - 15.0 g/dL 8.3(L) 9.5(L) 8.3(L)  Hematocrit 36.0 - 46.0 % 28.4(L) 28.0(L) 28.9(L)  Platelets 150 - 400 K/uL 266 - 237    ABG    Component Value Date/Time   PHART 7.362 04/06/2019 0154   PCO2ART 70.5 (HH) 04/06/2019 0154   PO2ART 83.0 04/06/2019 0154   HCO3 40.0 (H) 04/06/2019 0154   TCO2 42 (H) 04/06/2019 0154   ACIDBASEDEF 1.0 03/31/2019 1545   O2SAT 95.0 04/06/2019 0154    CBG (last 3)  Recent Labs    04/06/19 0325 04/06/19 0801 04/06/19 1158  GLUCAP 224* 185* 158*    CC time 34 minutes  Coralyn Helling, MD [facility] Pulmonary/Critical Care 04/06/2019, 3:04  PM

## 2019-04-06 NOTE — Progress Notes (Signed)
FPTS Social Progress Note   Following along socially, appreciate care by CCM and will resume care when patient stable for transfer out to floor.   Katha Cabal, DO PGY-1, [facility] 04/06/2019 6:59 PM    - Please contact intern pager [phone] as needed

## 2019-04-06 NOTE — Progress Notes (Signed)
eLink Physician-Brief Progress Note Patient Name: Ashley Moran DOB: 09-27-1950 MRN: [id number]   Date of Service  04/06/2019  HPI/Events of Note  Respiratory Failure - ABG on 70%/PRVC 32/TV 340/P 12 = 7.362/70.5/83.0.  eICU Interventions  Continue present ventilator management.      Intervention Category Major Interventions: Respiratory failure - evaluation and management  [name] 04/06/2019, 2:09 AM

## 2019-04-06 NOTE — Progress Notes (Signed)
Patient had new onset of bloody urine in her foley bag starting yesterday afternoon (1/7). She hadn't been putting out her usual output for me, so I bladder scanned her and irrigated her foley. She then put out approximately 1100 mL of bloody urine. Elink is aware and we are continuing to monitor.   Jacobo Forest, RN

## 2019-04-07 ENCOUNTER — Inpatient Hospital Stay (HOSPITAL_COMMUNITY): Payer: BC Managed Care – PPO

## 2019-04-07 LAB — CBC
HCT: 29.3 % — ABNORMAL LOW (ref 36.0–46.0)
Hemoglobin: 8.4 g/dL — ABNORMAL LOW (ref 12.0–15.0)
MCH: 21.3 pg — ABNORMAL LOW (ref 26.0–34.0)
MCHC: 28.7 g/dL — ABNORMAL LOW (ref 30.0–36.0)
MCV: 74.4 fL — ABNORMAL LOW (ref 80.0–100.0)
Platelets: 253 10*3/uL (ref 150–400)
RBC: 3.94 MIL/uL (ref 3.87–5.11)
RDW: 15.3 % (ref 11.5–15.5)
WBC: 14.7 10*3/uL — ABNORMAL HIGH (ref 4.0–10.5)
nRBC: 0.1 % (ref 0.0–0.2)

## 2019-04-07 LAB — POCT I-STAT 7, (LYTES, BLD GAS, ICA,H+H)
Acid-Base Excess: 14 mmol/L — ABNORMAL HIGH (ref 0.0–2.0)
Acid-Base Excess: 10 mmol/L — ABNORMAL HIGH (ref 0.0–2.0)
Acid-Base Excess: 8 mmol/L — ABNORMAL HIGH (ref 0.0–2.0)
Bicarbonate: 40.7 mmol/L — ABNORMAL HIGH (ref 20.0–28.0)
Bicarbonate: 37.9 mmol/L — ABNORMAL HIGH (ref 20.0–28.0)
Bicarbonate: 36.8 mmol/L — ABNORMAL HIGH (ref 20.0–28.0)
Calcium, Ion: 1.29 mmol/L (ref 1.15–1.40)
Calcium, Ion: 1.3 mmol/L (ref 1.15–1.40)
Calcium, Ion: 1.29 mmol/L (ref 1.15–1.40)
HCT: 28 % — ABNORMAL LOW (ref 36.0–46.0)
HCT: 24 % — ABNORMAL LOW (ref 36.0–46.0)
HCT: 29 % — ABNORMAL LOW (ref 36.0–46.0)
Hemoglobin: 9.5 g/dL — ABNORMAL LOW (ref 12.0–15.0)
Hemoglobin: 8.2 g/dL — ABNORMAL LOW (ref 12.0–15.0)
Hemoglobin: 9.9 g/dL — ABNORMAL LOW (ref 12.0–15.0)
O2 Saturation: 91 %
O2 Saturation: 95 %
O2 Saturation: 82 %
Patient temperature: 97.7
Patient temperature: 98.2
Patient temperature: 97.4
Potassium: 4.8 mmol/L (ref 3.5–5.1)
Potassium: 3.3 mmol/L — ABNORMAL LOW (ref 3.5–5.1)
Potassium: 3.3 mmol/L — ABNORMAL LOW (ref 3.5–5.1)
Sodium: 145 mmol/L (ref 135–145)
Sodium: 147 mmol/L — ABNORMAL HIGH (ref 135–145)
Sodium: 148 mmol/L — ABNORMAL HIGH (ref 135–145)
TCO2: 43 mmol/L — ABNORMAL HIGH (ref 22–32)
TCO2: 40 mmol/L — ABNORMAL HIGH (ref 22–32)
TCO2: 39 mmol/L — ABNORMAL HIGH (ref 22–32)
pCO2 arterial: 64 mmHg — ABNORMAL HIGH (ref 32.0–48.0)
pCO2 arterial: 73.5 mmHg (ref 32.0–48.0)
pCO2 arterial: 74.5 mmHg (ref 32.0–48.0)
pH, Arterial: 7.409 (ref 7.350–7.450)
pH, Arterial: 7.32 — ABNORMAL LOW (ref 7.350–7.450)
pH, Arterial: 7.299 — ABNORMAL LOW (ref 7.350–7.450)
pO2, Arterial: 62 mmHg — ABNORMAL LOW (ref 83.0–108.0)
pO2, Arterial: 82 mmHg — ABNORMAL LOW (ref 83.0–108.0)
pO2, Arterial: 52 mmHg — ABNORMAL LOW (ref 83.0–108.0)

## 2019-04-07 LAB — BASIC METABOLIC PANEL
Anion gap: 8 (ref 5–15)
BUN: 54 mg/dL — ABNORMAL HIGH (ref 8–23)
CO2: 36 mmol/L — ABNORMAL HIGH (ref 22–32)
Calcium: 8.9 mg/dL (ref 8.9–10.3)
Chloride: 102 mmol/L (ref 98–111)
Creatinine, Ser: 1.18 mg/dL — ABNORMAL HIGH (ref 0.44–1.00)
GFR calc Af Amer: 55 mL/min — ABNORMAL LOW (ref >60)
GFR calc non Af Amer: 48 mL/min — ABNORMAL LOW (ref >60)
Glucose, Bld: 233 mg/dL — ABNORMAL HIGH (ref 70–99)
Potassium: 4.1 mmol/L (ref 3.5–5.1)
Sodium: 146 mmol/L — ABNORMAL HIGH (ref 135–145)

## 2019-04-07 LAB — GLUCOSE, CAPILLARY
Glucose-Capillary: 121 mg/dL — ABNORMAL HIGH (ref 70–99)
Glucose-Capillary: 130 mg/dL — ABNORMAL HIGH (ref 70–99)
Glucose-Capillary: 188 mg/dL — ABNORMAL HIGH (ref 70–99)
Glucose-Capillary: 194 mg/dL — ABNORMAL HIGH (ref 70–99)
Glucose-Capillary: 204 mg/dL — ABNORMAL HIGH (ref 70–99)
Glucose-Capillary: 91 mg/dL (ref 70–99)

## 2019-04-07 LAB — PROCALCITONIN: Procalcitonin: 0.62 ng/mL

## 2019-04-07 MED ORDER — MIDAZOLAM BOLUS VIA INFUSION
1.0000 mg | INTRAVENOUS | Status: DC | PRN
  Administered 2019-04-09: 13:00:00 2 mg via INTRAVENOUS
  Filled 2019-04-07: qty 2

## 2019-04-07 MED ORDER — CISATRACURIUM BESYLATE 20 MG/10ML IV SOLN
7.0000 mg | INTRAVENOUS | Status: DC | PRN
  Administered 2019-04-07 – 2019-04-08 (×3): 7 mg via INTRAVENOUS
  Filled 2019-04-07 (×3): qty 10

## 2019-04-07 MED ORDER — CISATRACURIUM BESYLATE (PF) 10 MG/5ML IV SOLN
7.0000 mg | INTRAVENOUS | Status: DC | PRN
  Filled 2019-04-07: qty 3.5

## 2019-04-07 MED ORDER — CLONAZEPAM 0.5 MG PO TABS
1.0000 mg | ORAL_TABLET | Freq: Four times a day (QID) | ORAL | Status: DC
  Administered 2019-04-07 – 2019-04-10 (×12): 1 mg
  Filled 2019-04-07 (×12): qty 2

## 2019-04-07 MED ORDER — FENTANYL BOLUS VIA INFUSION
25.0000 ug | INTRAVENOUS | Status: DC | PRN
  Administered 2019-04-09: 25 ug via INTRAVENOUS
  Filled 2019-04-07: qty 25

## 2019-04-07 MED ORDER — ARTIFICIAL TEARS OPHTHALMIC OINT
1.0000 "application " | TOPICAL_OINTMENT | Freq: Three times a day (TID) | OPHTHALMIC | Status: DC
  Administered 2019-04-07 – 2019-04-10 (×9): 1 via OPHTHALMIC
  Filled 2019-04-07: qty 3.5

## 2019-04-07 MED ORDER — MIDAZOLAM 50MG/50ML (1MG/ML) PREMIX INFUSION
2.0000 mg/h | INTRAVENOUS | Status: DC
  Administered 2019-04-07 (×3): 10 mg/h via INTRAVENOUS
  Administered 2019-04-08 (×2): 7 mg/h via INTRAVENOUS
  Administered 2019-04-08 (×2): 10 mg/h via INTRAVENOUS
  Administered 2019-04-09 (×3): 7 mg/h via INTRAVENOUS
  Administered 2019-04-10 (×2): 8 mg/h via INTRAVENOUS
  Filled 2019-04-07 (×12): qty 50

## 2019-04-07 MED ORDER — OXYCODONE HCL 5 MG/5ML PO SOLN
5.0000 mg | Freq: Four times a day (QID) | ORAL | Status: DC
  Administered 2019-04-07 – 2019-04-10 (×12): 5 mg
  Filled 2019-04-07 (×12): qty 5

## 2019-04-07 MED ORDER — CISATRACURIUM BESYLATE (PF) 10 MG/5ML IV SOLN
0.1000 mg/kg | INTRAVENOUS | Status: DC | PRN
  Filled 2019-04-07 (×6): qty 3.5

## 2019-04-07 MED ORDER — CISATRACURIUM BESYLATE 20 MG/10ML IV SOLN
7.0000 mg | Freq: Once | INTRAVENOUS | Status: DC
  Filled 2019-04-07: qty 10

## 2019-04-07 MED ORDER — FENTANYL 2500MCG IN NS 250ML (10MCG/ML) PREMIX INFUSION
25.0000 ug/h | INTRAVENOUS | Status: DC
  Administered 2019-04-07 – 2019-04-10 (×7): 300 ug/h via INTRAVENOUS
  Filled 2019-04-07 (×8): qty 250

## 2019-04-07 MED ORDER — CISATRACURIUM BESYLATE (PF) 10 MG/5ML IV SOLN: 0.1000 mg/kg | INTRAVENOUS | Status: DC | PRN

## 2019-04-07 MED ORDER — FUROSEMIDE 10 MG/ML IJ SOLN
40.0000 mg | Freq: Once | INTRAMUSCULAR | Status: AC
  Administered 2019-04-07: 11:00:00 40 mg via INTRAVENOUS
  Filled 2019-04-07: qty 4

## 2019-04-07 MED ORDER — CISATRACURIUM BESYLATE (PF) 10 MG/5ML IV SOLN
7.0000 mg | INTRAVENOUS | Status: DC | PRN
  Filled 2019-04-07 (×2): qty 3.5

## 2019-04-07 MED ORDER — PRO-STAT SUGAR FREE PO LIQD
30.0000 mL | Freq: Three times a day (TID) | ORAL | Status: DC
  Administered 2019-04-07 – 2019-04-09 (×8): 30 mL
  Filled 2019-04-07 (×8): qty 30

## 2019-04-07 MED ORDER — QUETIAPINE FUMARATE 50 MG PO TABS
50.0000 mg | ORAL_TABLET | Freq: Two times a day (BID) | ORAL | Status: DC
  Administered 2019-04-07 – 2019-04-09 (×6): 50 mg
  Filled 2019-04-07 (×6): qty 1

## 2019-04-07 NOTE — Progress Notes (Signed)
FPTS Social Progress Note  Continuing to follow [name] socially while she is in the ICU.  We appreciate the excellent care provided by CCM and will resume care when she is transferred to the floor.  Ashley Moran C. Frances Furbish, MD PGY-3, [facility] 04/07/2019 5:38 AM

## 2019-04-07 NOTE — Progress Notes (Signed)
Spoke with pt's daughter.  Updated about plan for prone positioning, paralytic, sedation, and diuresis.  Coralyn Helling, MD [facility] Pulmonary/Critical Care 04/07/2019, 12:23 PM

## 2019-04-07 NOTE — Progress Notes (Signed)
Nutrition Follow-up  RD working remotely.  DOCUMENTATION CODES:   Not applicable  INTERVENTION:   Tube feeding can continue during prone positioning (this is NOT a contraindication to feeding). Consider post-pyloric Cortrak placement if gastric feedings are not tolerated well.   Continue tube feeding via OG tube: - Vital AF 1.2 @ 40 ml/hr (960 ml/day) - Increase Pro-stat 30 ml to TID  Tube feeding regimen provides 1452 kcal, 117 grams of protein, and 778 ml of H2O.   - Continue MVI with minerals daily  NUTRITION DIAGNOSIS:   Inadequate oral intake related to catabolic illness, acute illness as evidenced by NPO status.  Ongoing, being addressed via TF  GOAL:   Patient will meet greater than or equal to 90% of their needs  Met via TF  MONITOR:   Vent status, Skin, TF tolerance, Weight trends, Labs  REASON FOR ASSESSMENT:   Consult Enteral/tube feeding initiation and management  ASSESSMENT:   69 yo female admitted acute respiratory failure with ARDS secondary to COVID19 pneumonia requiring intubation. PMH includes CAD, HTN, HLD.  12/30 - admit 01/01 - intubated 01/03 - prone  RD consulted for TF initiation and management.  Pt proned again today. Neo off this AM. Pt with bloody UOP.  OG tube remains in place with TF infusing.  Weight up 16 lbs since admit. Pt is net positive 10.4 L. EDW: 61.6 kg. Per RN edema assessment, pt with moderate pitting generalized edema and moderate pitting edema to BUE and BLE.  Current TF: Vital AF 1.2 @ 40 ml/hr, Pro-stat 30 ml BID, free water 200 ml q 8 hours  Patient is currently intubated on ventilator support MV: 12.5 L/min Temp (24hrs), Avg:98 F (36.7 C), Min:97.7 F (36.5 C), Max:98.7 F (37.1 C) BP (a-line): 196/70 MAP (a-line): 103  Drips: Propofol: none Fentanyl Versed: 10 ml/hr LR: 20 ml/hr Morphine: 9 ml/hr  Medications reviewed and include: Lasix 40 mg once, SSI q 4 hours, Novolog 6 units q 4 hours,  Levemir 15 units BID, MVI with minerals, protonix  Labs reviewed: sodium 146, BUN 54, creatinine 1.18, hemoglobin 8.4 CBG's: 148-204 x 24 hours  UOP: 1938 ml x 24 hours I/O's: +10.4 L since admit  NUTRITION - FOCUSED PHYSICAL EXAM:  Unable to complete at this time. RD working remotely.  Diet Order:   Diet Order    None      EDUCATION NEEDS:   Not appropriate for education at this time  Skin:  Skin Assessment: Reviewed RN Assessment  Last BM:  04/07/19 rectal tube  Height:   Ht Readings from Last 1 Encounters:  03/13/19 4' 11"  (1.499 m)    Weight:   Wt Readings from Last 1 Encounters:  04/07/19 69.7 kg    Ideal Body Weight:  44.7 kg  BMI:  Body mass index is 31.04 kg/m.  Estimated Nutritional Needs:   Kcal:  1390 (PSU 2003b)  Protein:  93-124 grams  Fluid:  >/= 1.5    Gaynell Face, MS, RD, LDN Inpatient Clinical Dietitian Pager: [phone] Weekend/After Hours: [phone]

## 2019-04-07 NOTE — Progress Notes (Signed)
Assisted tele visit to patient with family member.  Noha Milberger Samson, RN  

## 2019-04-07 NOTE — Progress Notes (Signed)
eLink Physician-Brief Progress Note Patient Name: Ashley Moran DOB: 1950-09-11 MRN: [id number]   Date of Service  04/07/2019  HPI/Events of Note  Hypoxia - Sat decreased to 88% and Agitation - Currently on Fentanyl, Versed and Morphine IV infusions. Portable CXR done, but not posted in Epic yet.   eICU Interventions  Will order: 1. Increase ceiling on Fentanyl IV infusion to 400 mcg/hour. 2 Await AM CXR to be posted to Epic for review.      Intervention Category Major Interventions: Hypoxemia - evaluation and management  Syna Gad Eugene 04/07/2019, 5:59 AM

## 2019-04-07 NOTE — Progress Notes (Signed)
PULMONARY / CRITICAL CARE MEDICINE   NAME:  Ashley Moran, MRN:  [id number], DOB:  11-25-1950, LOS: 51 ADMISSION DATE:  April 09, 2019, CONSULTATION DATE:  04/28/18 REFERRING MD: Dr. Maia Breslow  , CHIEF COMPLAINT:  Dyspnea  BRIEF HISTORY:    69 yo female former smoker presented with dyspnea, abdominal pain, leg pain.  Had COVID exposure on 12/15.  Found to be COVID 19 positive with pneumonia.  Required intubation 1/02.  SIGNIFICANT PAST MEDICAL HISTORY   CAD, HTN, HLD  SIGNIFICANT EVENTS:  12/31 Transferred to ICU 01/02 VDRF 01/03 Proning 01/09 prone positioning  STUDIES:   CT chest 12/30 >> multifocal pneumonia V/Q scan 12/30 >> negative for PE  CULTURES:  Blood 12/30 >> Coag neg Staph  SARS CoV2 Ag 12/30 >> positive Pneumococcal Ag 12/31 >> negative Legionella Ag 12/31 >> negative Quantiferon gold 1/03 >> negative  ANTIMICROBIALS:  Ceftriaxone 12/30 >1/01 Azithromycin 12/30 >1/01 Vancomycin 1/02 >> 1/04 Zosyn 1/02 >>   COVID THERAPY:  Decadron 12/30 >> 1/08 Remdesivir 12/30 >> 12/31  LINES/TUBES:  ETT 1/02 >> Lt IJ CVL 1/02 >>  Rt radial A line 1/02 >>   CONSULTANTS:    SUBJECTIVE:  PaO2:FiO2 68.  Agitated with WUA.  OBJECTIVE:   CONSTITUTIONAL: BP (!) 145/75   Pulse (!) 122   Temp 98.1 F (36.7 C) (Oral)   Resp 17   Wt 69.7 kg   SpO2 (!) 85%   BMI 31.04 kg/m   I/O last 3 completed shifts: In: 4358.5 [I.V.:2516.4; NG/GT:1655; IV Piggyback:187] Out: 1696 [VELFY:1017; Stool:175]  CVP:  [4 mmHg-13 mmHg] 13 mmHg  Vent Mode: PRVC FiO2 (%):  [50 %-100 %] 90 % Set Rate:  [32 bmp] 32 bmp Vt Set:  [340 mL] 340 mL PEEP:  [10 PZW25-85 cmH20] 10 cmH20 Plateau Pressure:  [25 IDP82-42 cmH20] 25 cmH20  PHYSICAL EXAM:.   General - sedated Eyes - pupils reactive ENT - ETT in place Cardiac - regular rate/rhythm, no murmur Chest - b/l crackles Abdomen - soft, non tender, + bowel sounds Extremities - 1+ edema Skin - no rashes Neuro - RASS -3  CXR  (reviewed by me) - b/l ASD   RESOLVED PROBLEM LIST    ASSESSMENT AND PLAN    Acute hypoxic/hypercapnic respiratory failure with ARDS from COVID 19 pneumonia and bacterial HCAP. - day 8/10 of zosyn - lasix 40 mg IV x one on 1/09 - PaO2:FiO2 < 150 - prone positioning 1/09 - f/u CXR, ABG  Septic shock. - goal MAP > 65  Acute metabolic encephalopathy from sepsis, hypoxia. - RASS goal -4 - add scheduled klonopin, seroquel, oxycodone  Steroid induced hyperglycemia. - SSI with levemir  Hx of CAD s/p stent in 2019, HTN, HLD. - plavix  Anemia of critical illness. - f/u CBC - transfuse for Hb < 7  Best Practice:  DVT PROPHYLAXIS: Heparin SUP: Protonix  NUTRITION: TF MOBILITY: BR GOALS OF CARE: Full code DISPOSITION: ICU   LABS:   CMP Latest Ref Rng & Units 04/07/2019 04/07/2019 04/06/2019  Glucose 70 - 99 mg/dL 233(H) - 243(H)  BUN 8 - 23 mg/dL 54(H) - 60(H)  Creatinine 0.44 - 1.00 mg/dL 1.18(H) - 1.35(H)  Sodium 135 - 145 mmol/L 146(H) 145 145  Potassium 3.5 - 5.1 mmol/L 4.1 4.8 3.8  Chloride 98 - 111 mmol/L 102 - 100  CO2 22 - 32 mmol/L 36(H) - 34(H)  Calcium 8.9 - 10.3 mg/dL 8.9 - 8.7(L)  Total Protein 6.5 - 8.1 g/dL - - -  Total Bilirubin 0.3 - 1.2 mg/dL - - -  Alkaline Phos 38 - 126 U/L - - -  AST 15 - 41 U/L - - -  ALT 0 - 44 U/L - - -    CBC Latest Ref Rng & Units 04/07/2019 04/07/2019 04/06/2019  WBC 4.0 - 10.5 K/uL 14.7(H) - 15.0(H)  Hemoglobin 12.0 - 15.0 g/dL 2.5(O) 0.3(B) 8.3(L)  Hematocrit 36.0 - 46.0 % 29.3(L) 28.0(L) 28.4(L)  Platelets 150 - 400 K/uL 253 - 266    ABG    Component Value Date/Time   PHART 7.409 04/07/2019 0234   PCO2ART 64.0 (H) 04/07/2019 0234   PO2ART 62.0 (L) 04/07/2019 0234   HCO3 40.7 (H) 04/07/2019 0234   TCO2 43 (H) 04/07/2019 0234   ACIDBASEDEF 1.0 03/31/2019 1545   O2SAT 91.0 04/07/2019 0234    CBG (last 3)  Recent Labs    04/06/19 2359 04/07/19 0440 04/07/19 0734  GLUCAP 204* 194* 188*    CC time 32  minutes  Coralyn Helling, MD [facility] Pulmonary/Critical Care 04/07/2019, 10:28 AM

## 2019-04-08 ENCOUNTER — Inpatient Hospital Stay (HOSPITAL_COMMUNITY): Payer: BC Managed Care – PPO

## 2019-04-08 LAB — POCT I-STAT 7, (LYTES, BLD GAS, ICA,H+H)
Acid-Base Excess: 10 mmol/L — ABNORMAL HIGH (ref 0.0–2.0)
Bicarbonate: 38.7 mmol/L — ABNORMAL HIGH (ref 20.0–28.0)
Calcium, Ion: 1.26 mmol/L (ref 1.15–1.40)
HCT: 26 % — ABNORMAL LOW (ref 36.0–46.0)
Hemoglobin: 8.8 g/dL — ABNORMAL LOW (ref 12.0–15.0)
O2 Saturation: 89 %
Patient temperature: 96.6
Potassium: 4.2 mmol/L (ref 3.5–5.1)
Sodium: 145 mmol/L (ref 135–145)
TCO2: 41 mmol/L — ABNORMAL HIGH (ref 22–32)
pCO2 arterial: 78.9 mmHg (ref 32.0–48.0)
pH, Arterial: 7.293 — ABNORMAL LOW (ref 7.350–7.450)
pO2, Arterial: 63 mmHg — ABNORMAL LOW (ref 83.0–108.0)

## 2019-04-08 LAB — BASIC METABOLIC PANEL
Anion gap: 6 (ref 5–15)
BUN: 52 mg/dL — ABNORMAL HIGH (ref 8–23)
CO2: 37 mmol/L — ABNORMAL HIGH (ref 22–32)
Calcium: 8.1 mg/dL — ABNORMAL LOW (ref 8.9–10.3)
Chloride: 104 mmol/L (ref 98–111)
Creatinine, Ser: 1 mg/dL (ref 0.44–1.00)
GFR calc Af Amer: >60 mL/min (ref >60)
GFR calc non Af Amer: 58 mL/min — ABNORMAL LOW (ref >60)
Glucose, Bld: 130 mg/dL — ABNORMAL HIGH (ref 70–99)
Potassium: 3.6 mmol/L (ref 3.5–5.1)
Sodium: 147 mmol/L — ABNORMAL HIGH (ref 135–145)

## 2019-04-08 LAB — BLOOD GAS, ARTERIAL
Acid-Base Excess: 7.6 mmol/L — ABNORMAL HIGH (ref 0.0–2.0)
Acid-Base Excess: 6.7 mmol/L — ABNORMAL HIGH (ref 0.0–2.0)
Acid-Base Excess: 7 mmol/L — ABNORMAL HIGH (ref 0.0–2.0)
Bicarbonate: 36.8 mmol/L — ABNORMAL HIGH (ref 20.0–28.0)
Bicarbonate: 35.2 mmol/L — ABNORMAL HIGH (ref 20.0–28.0)
Bicarbonate: 34.5 mmol/L — ABNORMAL HIGH (ref 20.0–28.0)
Drawn by: 56037
Drawn by: 519031
FIO2: 100
FIO2: 80
FIO2: 80
O2 Saturation: 97.6 %
O2 Saturation: 96.8 %
O2 Saturation: 94.5 %
Patient temperature: 36.4
Patient temperature: 36.3
Patient temperature: 37
pCO2 arterial: 116 mmHg (ref 32.0–48.0)
pCO2 arterial: 101 mmHg (ref 32.0–48.0)
pCO2 arterial: 88.2 mmHg (ref 32.0–48.0)
pH, Arterial: 7.125 — CL (ref 7.350–7.450)
pH, Arterial: 7.164 — CL (ref 7.350–7.450)
pH, Arterial: 7.217 — ABNORMAL LOW (ref 7.350–7.450)
pO2, Arterial: 135 mmHg — ABNORMAL HIGH (ref 83.0–108.0)
pO2, Arterial: 92.1 mmHg (ref 83.0–108.0)
pO2, Arterial: 78.1 mmHg — ABNORMAL LOW (ref 83.0–108.0)

## 2019-04-08 LAB — CBC WITH DIFFERENTIAL/PLATELET
Abs Immature Granulocytes: 0.14 10*3/uL — ABNORMAL HIGH (ref 0.00–0.07)
Basophils Absolute: 0 10*3/uL (ref 0.0–0.1)
Basophils Relative: 0 %
Eosinophils Absolute: 0.3 10*3/uL (ref 0.0–0.5)
Eosinophils Relative: 2 %
HCT: 27.7 % — ABNORMAL LOW (ref 36.0–46.0)
Hemoglobin: 7.4 g/dL — ABNORMAL LOW (ref 12.0–15.0)
Immature Granulocytes: 1 %
Lymphocytes Relative: 5 %
Lymphs Abs: 0.7 10*3/uL (ref 0.7–4.0)
MCH: 21.6 pg — ABNORMAL LOW (ref 26.0–34.0)
MCHC: 26.7 g/dL — ABNORMAL LOW (ref 30.0–36.0)
MCV: 80.8 fL (ref 80.0–100.0)
Monocytes Absolute: 0.4 10*3/uL (ref 0.1–1.0)
Monocytes Relative: 3 %
Neutro Abs: 13.2 10*3/uL — ABNORMAL HIGH (ref 1.7–7.7)
Neutrophils Relative %: 89 %
Platelets: 256 10*3/uL (ref 150–400)
RBC: 3.43 MIL/uL — ABNORMAL LOW (ref 3.87–5.11)
RDW: 16 % — ABNORMAL HIGH (ref 11.5–15.5)
WBC: 14.7 10*3/uL — ABNORMAL HIGH (ref 4.0–10.5)
nRBC: 0.1 % (ref 0.0–0.2)

## 2019-04-08 LAB — GLUCOSE, CAPILLARY
Glucose-Capillary: 102 mg/dL — ABNORMAL HIGH (ref 70–99)
Glucose-Capillary: 107 mg/dL — ABNORMAL HIGH (ref 70–99)
Glucose-Capillary: 109 mg/dL — ABNORMAL HIGH (ref 70–99)
Glucose-Capillary: 77 mg/dL (ref 70–99)
Glucose-Capillary: 83 mg/dL (ref 70–99)
Glucose-Capillary: 85 mg/dL (ref 70–99)
Glucose-Capillary: 96 mg/dL (ref 70–99)

## 2019-04-08 MED ORDER — VECURONIUM BROMIDE 10 MG IV SOLR: 10.0000 mg | Freq: Once | INTRAVENOUS | Status: AC

## 2019-04-08 MED ORDER — FUROSEMIDE 10 MG/ML IJ SOLN
40.0000 mg | Freq: Once | INTRAMUSCULAR | Status: AC
  Administered 2019-04-08: 11:00:00 40 mg via INTRAVENOUS
  Filled 2019-04-08: qty 4

## 2019-04-08 MED ORDER — PHENYLEPHRINE CONCENTRATED 100MG/250ML (0.4 MG/ML) INFUSION SIMPLE
0.0000 ug/min | INTRAVENOUS | Status: DC
  Administered 2019-04-08: 350 ug/min via INTRAVENOUS
  Administered 2019-04-08 (×2): 250 ug/min via INTRAVENOUS
  Administered 2019-04-09: 240 ug/min via INTRAVENOUS
  Administered 2019-04-09: 370 ug/min via INTRAVENOUS
  Administered 2019-04-09: 200 ug/min via INTRAVENOUS
  Administered 2019-04-10: 400 ug/min via INTRAVENOUS
  Administered 2019-04-10: 133.333 ug/min via INTRAVENOUS
  Filled 2019-04-08 (×10): qty 250

## 2019-04-08 MED ORDER — VECURONIUM BROMIDE 10 MG IV SOLR
INTRAVENOUS | Status: AC
  Administered 2019-04-08: 01:00:00 10 mg via INTRAVENOUS
  Filled 2019-04-08: qty 10

## 2019-04-08 NOTE — Progress Notes (Signed)
Provider Stretch, MD ordered ventilator settings be changed with respiratory rate to be set at 35 and evaluate if tidal volume can be increased. RT Vernona Rieger notified of request.

## 2019-04-08 NOTE — Progress Notes (Signed)
FPTS Social Progress Note  Continuing to follow Ashley Moran socially while she is in the ICU.  We appreciate the excellent care provided by CCM and will resume care when she is transferred to the floor.  Dr. Parke Simmers 04/08/2019 8:05 AM Please contact intern pager [phone] for handoff when needed

## 2019-04-08 NOTE — Progress Notes (Signed)
MD @ bedside made adjustments to ventilator. Peep increased to 16.

## 2019-04-08 NOTE — Progress Notes (Signed)
Proned pt 3 RN and RT at bedside.  Pt tolerated well

## 2019-04-08 NOTE — Progress Notes (Signed)
Spoke with pt's daughter and updated about current status and treatment plan.  Coralyn Helling, MD [facility] Pulmonary/Critical Care 04/08/2019, 3:23 PM

## 2019-04-08 NOTE — Progress Notes (Signed)
Critical care plan of care note  Situation: Patient with Covid ARDS with intubated and prone and was on 80% FiO2 developed worsening hypoxia with O2 sats 82-84 100% FiO2 and a PEEP of 14. No change in peak or plateau pressures reported with a desaturation episode  Physical exam: Unchanged from documented in the progress note Ventilator shows that patient was dyssynchronous, 1 dose of vecuronium 10 mg given  Following paralysis her plateau pressure was 36 on PEEP 16.  PEEP was increased to 18 which resulted in increase in plateau as well as driving pressure indicating lack of any recruitable lung. As her plateau pressure is 36, we decreased the tidal volume to 300 cc and increased the respiratory rate to 32 to keep similar minute ventilation. Chest x-ray did not show any pneumothorax or new consolidation, similar bilateral patchy opacities. Repeat blood gas at 45 minutes to assess for hypercarbia, will tolerate permissive hypercapnia as long as pH is 7.2 or above.

## 2019-04-08 NOTE — Progress Notes (Signed)
eLink Physician-Brief Progress Note Patient Name: Shakita Keir DOB: 12/18/1950 MRN: [id number]   Date of Service  04/08/2019  HPI/Events of Note  ABG    Component Value Date/Time   PHART 7.164 (LL) 04/08/2019 0628   PCO2ART 101 (HH) 04/08/2019 0628   PO2ART 92.1 04/08/2019 0628   HCO3 35.2 (H) 04/08/2019 0628   TCO2 39 (H) 04/07/2019 2308   ACIDBASEDEF 1.0 03/31/2019 1545   O2SAT 96.8 04/08/2019 0628   Vent Mode: PRVC FiO2 (%):  [80 %-100 %] 100 % Set Rate:  [32 bmp] 32 bmp Vt Set:  [300 mL-340 mL] 340 mL PEEP:  [10 cmH20-16 cmH20] 16 cmH20 Plateau Pressure:  [25 cmH20-35 cmH20] 35 cmH20   eICU Interventions  RN will ask RT to maximize RR to 35/min.   I've also asked them to ask RT if VT can be safely increased any further while maintaining PPlat < 30. It would be acceptable to reduce PEEP by 2-4 (if tolerated) to make room in terms of pressure for such a VT increase.     Intervention Category Major Interventions: Acid-Base disturbance - evaluation and management;Respiratory failure - evaluation and management  Marveen Reeks Chriss Redel 04/08/2019, 7:02 AM

## 2019-04-08 NOTE — Progress Notes (Signed)
PULMONARY / CRITICAL CARE MEDICINE   NAME:  Ashley Moran, MRN:  [id number], DOB:  68-Nov-1952, LOS: 11 ADMISSION DATE:  03/23/2019, CONSULTATION DATE:  04/28/18 REFERRING MD: Dr. Lezlie Octave  , CHIEF COMPLAINT:  Dyspnea  BRIEF HISTORY:    69 yo female former smoker presented with dyspnea, abdominal pain, leg pain.  Had COVID exposure on 12/15.  Found to be COVID 19 positive with pneumonia.  Required intubation 1/02.  SIGNIFICANT PAST MEDICAL HISTORY   CAD, HTN, HLD  SIGNIFICANT EVENTS:  12/31 Transferred to ICU 01/02 VDRF 01/03 Proning 01/09 prone positioning  STUDIES:   CT chest 12/30 >> multifocal pneumonia V/Q scan 12/30 >> negative for PE  CULTURES:  Blood 12/30 >> Coag neg Staph  SARS CoV2 Ag 12/30 >> positive Pneumococcal Ag 12/31 >> negative Legionella Ag 12/31 >> negative Quantiferon gold 1/03 >> negative  ANTIMICROBIALS:  Ceftriaxone 12/30 >1/01 Azithromycin 12/30 >1/01 Vancomycin 1/02 >> 1/04 Zosyn 1/02 >>   COVID THERAPY:  Decadron 12/30 >> 1/08 Remdesivir 12/30 >> 12/31  LINES/TUBES:  ETT 1/02 >> Lt IJ CVL 1/02 >>  Rt radial A line 1/02 >>   CONSULTANTS:    SUBJECTIVE:  ABG showed hypercapnia.    OBJECTIVE:   CONSTITUTIONAL: BP (!) 106/58   Pulse 89   Temp 97.7 F (36.5 C) (Oral)   Resp (!) 35   Wt 69.7 kg   SpO2 97%   BMI 31.04 kg/m   I/O last 3 completed shifts: In: 3995.2 [I.V.:3275.2; NG/GT:720] Out: 3685 [Urine:3210; Stool:475]  CVP:  [9 mmHg-13 mmHg] 10 mmHg  Vent Mode: PRVC FiO2 (%):  [80 %-100 %] 80 % Set Rate:  [32 bmp-35 bmp] 35 bmp Vt Set:  [300 mL-350 mL] 350 mL PEEP:  [14 cmH20-16 cmH20] 16 cmH20 Plateau Pressure:  [26 cmH20-40 cmH20] 40 cmH20  PHYSICAL EXAM:.   General - sedated Eyes - pupils reactive ENT - ETT in place Cardiac - regular rate/rhythm, no murmur Chest - b/l rhonchi Abdomen - soft, non tender, + bowel sounds Extremities - 1+ edema Skin - no rashes Neuro - RASS -3  CXR (reviewed by me) -  b/l ASD   RESOLVED PROBLEM LIST    ASSESSMENT AND PLAN    Acute hypoxic/hypercapnic respiratory failure with ARDS from COVID 19 pneumonia and bacterial HCAP. - day 9/10 zosyn - lasix 40 mg IV x one on 1/10 - f/u CXR - f/u ABG >> if PaO2/FiO2 less then 150, then prone again  Septic shock. - pressors to keep MAP > 65  Acute metabolic encephalopathy from sepsis, hypoxia. - RASS goal -4 - add scheduled klonopin, seroquel, oxycodone  Steroid induced hyperglycemia. - SSI with levemir  Hx of CAD s/p stent in 2019, HTN, HLD. - continue plavix  Anemia of critical illness. - f/u CBC - transfuse for Hb < 7  Best Practice:  DVT PROPHYLAXIS: Heparin SUP: Protonix  NUTRITION: TF MOBILITY: BR GOALS OF CARE: Full code DISPOSITION: ICU   LABS:   CMP Latest Ref Rng & Units 04/08/2019 04/07/2019 04/07/2019  Glucose 70 - 99 mg/dL 025(K) - -  BUN 8 - 23 mg/dL 27(C) - -  Creatinine 6.23 - 1.00 mg/dL 7.62 - -  Sodium 831 - 145 mmol/L 147(H) 148(H) 147(H)  Potassium 3.5 - 5.1 mmol/L 3.6 3.3(L) 3.3(L)  Chloride 98 - 111 mmol/L 104 - -  CO2 22 - 32 mmol/L 37(H) - -  Calcium 8.9 - 10.3 mg/dL 8.1(L) - -  Total Protein 6.5 - 8.1 g/dL - - -  Total Bilirubin 0.3 - 1.2 mg/dL - - -  Alkaline Phos 38 - 126 U/L - - -  AST 15 - 41 U/L - - -  ALT 0 - 44 U/L - - -    CBC Latest Ref Rng & Units 04/07/2019 04/07/2019 04/07/2019  WBC 4.0 - 10.5 K/uL - - 14.7(H)  Hemoglobin 12.0 - 15.0 g/dL 9.9(L) 8.2(L) 8.4(L)  Hematocrit 36.0 - 46.0 % 29.0(L) 24.0(L) 29.3(L)  Platelets 150 - 400 K/uL - - 253    ABG    Component Value Date/Time   PHART 7.164 (LL) 04/08/2019 0628   PCO2ART 101 (HH) 04/08/2019 0628   PO2ART 92.1 04/08/2019 0628   HCO3 35.2 (H) 04/08/2019 0628   TCO2 39 (H) 04/07/2019 2308   ACIDBASEDEF 1.0 03/31/2019 1545   O2SAT 96.8 04/08/2019 0628    CBG (last 3)  Recent Labs    04/08/19 0000 04/08/19 0323 04/08/19 0809  GLUCAP 77 107* 109*    CC time 33 minutes  Chesley Mires,  MD [place] 04/08/2019, 8:52 AM

## 2019-04-08 NOTE — Progress Notes (Signed)
CRITICAL VALUE ALERT  Critical Value:  PH 7.125,  pCO2 116  Date & Time Notied:  04/08/2019 @ 0440  Provider Notified: Vernona Rieger Gleason, PA  Orders Received/Actions taken: No new orders at this time.

## 2019-04-08 NOTE — Progress Notes (Signed)
CRITICAL VALUE ALERT  Critical Value:  ABG 7.164/101/92.1/35.2  Date & Time Notied:  04/08/19 @ 0600  Provider Notified: Pola Corn MD  Orders Received/Actions taken: awaiting orders

## 2019-04-09 ENCOUNTER — Inpatient Hospital Stay (HOSPITAL_COMMUNITY): Payer: BC Managed Care – PPO

## 2019-04-09 DIAGNOSIS — J1282 Pneumonia due to coronavirus disease 2019: Secondary | ICD-10-CM

## 2019-04-09 LAB — POCT I-STAT 7, (LYTES, BLD GAS, ICA,H+H)
Acid-Base Excess: 9 mmol/L — ABNORMAL HIGH (ref 0.0–2.0)
Acid-Base Excess: 8 mmol/L — ABNORMAL HIGH (ref 0.0–2.0)
Bicarbonate: 37.6 mmol/L — ABNORMAL HIGH (ref 20.0–28.0)
Bicarbonate: 35.5 mmol/L — ABNORMAL HIGH (ref 20.0–28.0)
Calcium, Ion: 1.27 mmol/L (ref 1.15–1.40)
Calcium, Ion: 1.22 mmol/L (ref 1.15–1.40)
HCT: 28 % — ABNORMAL LOW (ref 36.0–46.0)
HCT: 23 % — ABNORMAL LOW (ref 36.0–46.0)
Hemoglobin: 9.5 g/dL — ABNORMAL LOW (ref 12.0–15.0)
Hemoglobin: 7.8 g/dL — ABNORMAL LOW (ref 12.0–15.0)
O2 Saturation: 99 %
O2 Saturation: 87 %
Patient temperature: 98.6
Patient temperature: 98.6
Potassium: 3.8 mmol/L (ref 3.5–5.1)
Potassium: 4 mmol/L (ref 3.5–5.1)
Sodium: 148 mmol/L — ABNORMAL HIGH (ref 135–145)
Sodium: 149 mmol/L — ABNORMAL HIGH (ref 135–145)
TCO2: 40 mmol/L — ABNORMAL HIGH (ref 22–32)
TCO2: 38 mmol/L — ABNORMAL HIGH (ref 22–32)
pCO2 arterial: 80.2 mmHg (ref 32.0–48.0)
pCO2 arterial: 66.6 mmHg (ref 32.0–48.0)
pH, Arterial: 7.279 — ABNORMAL LOW (ref 7.350–7.450)
pH, Arterial: 7.335 — ABNORMAL LOW (ref 7.350–7.450)
pO2, Arterial: 188 mmHg — ABNORMAL HIGH (ref 83.0–108.0)
pO2, Arterial: 59 mmHg — ABNORMAL LOW (ref 83.0–108.0)

## 2019-04-09 LAB — BASIC METABOLIC PANEL
Anion gap: 8 (ref 5–15)
Anion gap: 6 (ref 5–15)
BUN: 51 mg/dL — ABNORMAL HIGH (ref 8–23)
BUN: 50 mg/dL — ABNORMAL HIGH (ref 8–23)
CO2: 33 mmol/L — ABNORMAL HIGH (ref 22–32)
CO2: 37 mmol/L — ABNORMAL HIGH (ref 22–32)
Calcium: 8.4 mg/dL — ABNORMAL LOW (ref 8.9–10.3)
Calcium: 8.8 mg/dL — ABNORMAL LOW (ref 8.9–10.3)
Chloride: 108 mmol/L (ref 98–111)
Chloride: 104 mmol/L (ref 98–111)
Creatinine, Ser: 1.22 mg/dL — ABNORMAL HIGH (ref 0.44–1.00)
Creatinine, Ser: 1.37 mg/dL — ABNORMAL HIGH (ref 0.44–1.00)
GFR calc Af Amer: 53 mL/min — ABNORMAL LOW (ref >60)
GFR calc Af Amer: 46 mL/min — ABNORMAL LOW (ref >60)
GFR calc non Af Amer: 46 mL/min — ABNORMAL LOW (ref >60)
GFR calc non Af Amer: 40 mL/min — ABNORMAL LOW (ref >60)
Glucose, Bld: 171 mg/dL — ABNORMAL HIGH (ref 70–99)
Glucose, Bld: 161 mg/dL — ABNORMAL HIGH (ref 70–99)
Potassium: 4.1 mmol/L (ref 3.5–5.1)
Potassium: 4.9 mmol/L (ref 3.5–5.1)
Sodium: 149 mmol/L — ABNORMAL HIGH (ref 135–145)
Sodium: 147 mmol/L — ABNORMAL HIGH (ref 135–145)

## 2019-04-09 LAB — CBC
HCT: 25.7 % — ABNORMAL LOW (ref 36.0–46.0)
HCT: 28 % — ABNORMAL LOW (ref 36.0–46.0)
Hemoglobin: 7 g/dL — ABNORMAL LOW (ref 12.0–15.0)
Hemoglobin: 7.6 g/dL — ABNORMAL LOW (ref 12.0–15.0)
MCH: 21.9 pg — ABNORMAL LOW (ref 26.0–34.0)
MCH: 21.9 pg — ABNORMAL LOW (ref 26.0–34.0)
MCHC: 27.2 g/dL — ABNORMAL LOW (ref 30.0–36.0)
MCHC: 27.1 g/dL — ABNORMAL LOW (ref 30.0–36.0)
MCV: 80.6 fL (ref 80.0–100.0)
MCV: 80.7 fL (ref 80.0–100.0)
Platelets: 269 10*3/uL (ref 150–400)
Platelets: 328 10*3/uL (ref 150–400)
RBC: 3.19 MIL/uL — ABNORMAL LOW (ref 3.87–5.11)
RBC: 3.47 MIL/uL — ABNORMAL LOW (ref 3.87–5.11)
RDW: 16.3 % — ABNORMAL HIGH (ref 11.5–15.5)
RDW: 16.6 % — ABNORMAL HIGH (ref 11.5–15.5)
WBC: 10.9 10*3/uL — ABNORMAL HIGH (ref 4.0–10.5)
WBC: 15.5 10*3/uL — ABNORMAL HIGH (ref 4.0–10.5)
nRBC: 0 % (ref 0.0–0.2)
nRBC: 0.1 % (ref 0.0–0.2)

## 2019-04-09 LAB — GLUCOSE, CAPILLARY
Glucose-Capillary: 64 mg/dL — ABNORMAL LOW (ref 70–99)
Glucose-Capillary: 157 mg/dL — ABNORMAL HIGH (ref 70–99)
Glucose-Capillary: 52 mg/dL — ABNORMAL LOW (ref 70–99)
Glucose-Capillary: 144 mg/dL — ABNORMAL HIGH (ref 70–99)
Glucose-Capillary: 67 mg/dL — ABNORMAL LOW (ref 70–99)
Glucose-Capillary: 139 mg/dL — ABNORMAL HIGH (ref 70–99)
Glucose-Capillary: 116 mg/dL — ABNORMAL HIGH (ref 70–99)
Glucose-Capillary: 127 mg/dL — ABNORMAL HIGH (ref 70–99)
Glucose-Capillary: 148 mg/dL — ABNORMAL HIGH (ref 70–99)

## 2019-04-09 LAB — LACTIC ACID, PLASMA: Lactic Acid, Venous: 1.4 mmol/L (ref 0.5–1.9)

## 2019-04-09 MED ORDER — POTASSIUM CHLORIDE 20 MEQ/15ML (10%) PO SOLN
40.0000 meq | Freq: Three times a day (TID) | ORAL | Status: AC
  Administered 2019-04-09 (×2): 40 meq
  Filled 2019-04-09 (×2): qty 30

## 2019-04-09 MED ORDER — METOLAZONE 5 MG PO TABS
10.0000 mg | ORAL_TABLET | Freq: Once | ORAL | Status: AC
  Administered 2019-04-09: 10 mg via ORAL
  Filled 2019-04-09: qty 2

## 2019-04-09 MED ORDER — FUROSEMIDE 10 MG/ML IJ SOLN
40.0000 mg | Freq: Four times a day (QID) | INTRAMUSCULAR | Status: AC
  Administered 2019-04-09 (×3): 40 mg via INTRAVENOUS
  Filled 2019-04-09 (×3): qty 4

## 2019-04-09 MED ORDER — FREE WATER
250.0000 mL | Status: DC
  Administered 2019-04-09 – 2019-04-10 (×6): 250 mL

## 2019-04-09 MED ORDER — DEXTROSE 50 % IV SOLN
INTRAVENOUS | Status: AC
  Administered 2019-04-09: 50 mL
  Filled 2019-04-09: qty 50

## 2019-04-09 MED ORDER — DEXTROSE 50 % IV SOLN
INTRAVENOUS | Status: AC
  Administered 2019-04-09: 25 mL
  Filled 2019-04-09: qty 50

## 2019-04-09 MED ORDER — NOREPINEPHRINE 4 MG/250ML-% IV SOLN
INTRAVENOUS | Status: AC
  Filled 2019-04-09: qty 250

## 2019-04-09 MED ORDER — VASOPRESSIN 20 UNIT/ML IV SOLN
0.0300 [IU]/min | INTRAVENOUS | Status: DC
  Administered 2019-04-10: 0.03 [IU]/min via INTRAVENOUS
  Filled 2019-04-09: qty 2

## 2019-04-09 MED ORDER — ALBUMIN HUMAN 5 % IV SOLN
12.5000 g | Freq: Once | INTRAVENOUS | Status: AC
  Administered 2019-04-09: 22:00:00 12.5 g via INTRAVENOUS
  Filled 2019-04-09: qty 250

## 2019-04-09 MED ORDER — NOREPINEPHRINE 4 MG/250ML-% IV SOLN
0.0000 ug/min | INTRAVENOUS | Status: DC
  Administered 2019-04-09 – 2019-04-10 (×2): 10 ug/min via INTRAVENOUS
  Filled 2019-04-09 (×2): qty 250

## 2019-04-09 NOTE — Progress Notes (Signed)
RT NOTE:  Pt's head turned to the LEFT. ETT secured throughout

## 2019-04-09 NOTE — Progress Notes (Signed)
Patient changed back to supine position at 1108.  Will obtain ABG in 1 hour.  Will continue to monitor.

## 2019-04-09 NOTE — Progress Notes (Signed)
Hypoglycemic Event  CBG: 64  Treatment: Amp of D50   Symptoms: None  Follow-up CBG: 157 Time: 0545  Possible Reasons for Event: NPO   Comments/MD notified: Followed Hypoglycemic protocol     Vincent Gros, RN

## 2019-04-09 NOTE — Progress Notes (Signed)
eLink Physician-Brief Progress Note Patient Name: Ashley Moran DOB: 1951-03-09 MRN: [id number]   Date of Service  04/09/2019  HPI/Events of Note  ABG    Component Value Date/Time   PHART 7.279 (L) 04/09/2019 0450   PCO2ART 80.2 (HH) 04/09/2019 0450   PO2ART 188.0 (H) 04/09/2019 0450   HCO3 37.6 (H) 04/09/2019 0450   TCO2 40 (H) 04/09/2019 0450   ACIDBASEDEF 1.0 03/31/2019 1545   O2SAT 99.0 04/09/2019 0450   Vent Mode: PRVC FiO2 (%):  [70 %-100 %] 100 % Set Rate:  [35 bmp] 35 bmp Vt Set:  [350 mL] 350 mL PEEP:  [16 cmH20] 16 cmH20 Plateau Pressure:  [33 cmH20-40 cmH20] 37 cmH20   eICU Interventions  RR already maximized. Lung compliance will not tolerate an increase in VT without unacceptable pressures which would increase risk of barotrauma.  Maintain current settings.     Intervention Category Major Interventions: Acid-Base disturbance - evaluation and management  Marveen Reeks Huntleigh Doolen 04/09/2019, 6:00 AM

## 2019-04-09 NOTE — Progress Notes (Signed)
Hypoglycemic Event  CBG: 52  Treatment: D50 50 mL (25 gm)  Symptoms: None  Follow-up CBG: Time:1031 CBG Result:144  Possible Reasons for Event: Inadequate meal intake  Comments/MD notified: MD Aware    Marin Comment

## 2019-04-09 NOTE — Progress Notes (Signed)
Patients head turned.

## 2019-04-09 NOTE — Progress Notes (Signed)
Proned patient RT and 4RN at bedside

## 2019-04-09 NOTE — Progress Notes (Signed)
PULMONARY / CRITICAL CARE MEDICINE   NAME:  Ashley Moran, MRN:  [id number], DOB:  [birth year], LOS: 37 ADMISSION DATE:  03/14/2019, CONSULTATION DATE:  04/28/18 REFERRING MD: Dr. Maia Breslow  , CHIEF COMPLAINT:  Dyspnea  BRIEF HISTORY:    69 yo female former smoker presented with dyspnea, abdominal pain, leg pain.  Had COVID exposure on 12/15.  Found to be COVID 19 positive with pneumonia.  Required intubation 1/02.  SIGNIFICANT PAST MEDICAL HISTORY   CAD, HTN, HLD  SIGNIFICANT EVENTS:  12/31 Transferred to ICU 01/02 VDRF 01/03 Proning 01/09 prone positioning  STUDIES:   CT chest 12/30 >> multifocal pneumonia V/Q scan 12/30 >> negative for PE  CULTURES:  Blood 12/30 >> Coag neg Staph  SARS CoV2 Ag 12/30 >> positive Pneumococcal Ag 12/31 >> negative Legionella Ag 12/31 >> negative Quantiferon gold 1/03 >> negative  ANTIMICROBIALS:  Ceftriaxone 12/30 >1/01 Azithromycin 12/30 >1/01 Vancomycin 1/02 >> 1/04 Zosyn 1/02 >>   COVID THERAPY:  Decadron 12/30 >> 1/08 Remdesivir 12/30 >> 12/31  LINES/TUBES:  ETT 1/02 >> Lt IJ CVL 1/02 >>  Rt radial A line 1/02 >>   CONSULTANTS:    SUBJECTIVE:  No events overnight, remains hypoxic and proned   OBJECTIVE:   CONSTITUTIONAL: BP (!) 147/58   Pulse 96   Temp 97.9 F (36.6 C) (Oral)   Resp (!) 35   Wt 70.1 kg   SpO2 94%   BMI 31.21 kg/m   I/O last 3 completed shifts: In: 4004.9 [I.V.:3904.9; Other:100] Out: 2370 [Urine:2370]  CVP:  [8 mmHg-17 mmHg] 10 mmHg  Vent Mode: PRVC FiO2 (%):  [80 %-100 %] 100 % Set Rate:  [35 bmp] 35 bmp Vt Set:  [350 mL] 350 mL PEEP:  [16 cmH20] 16 cmH20 Plateau Pressure:  [28 cmH20-37 cmH20] 28 cmH20  PHYSICAL EXAM:.   General - Sedated and intubated, proned  HEENT: [place]/AT, PERRL, EOM-I and MMM, ETT in place Cardiac - RRR, Nl S1/S2 and -M/R/G Chest - Coarse diffusely Abdomen - Soft, NT, ND and +BS Extremities - 1+ edema Skin - Intact Neuro - RASS -3  I reviewed CXR  myself, ETT is in a good position and infiltrate noted  Discussed with bedside RN and RT  RESOLVED PROBLEM LIST    ASSESSMENT AND PLAN    Acute hypoxic/hypercapnic respiratory failure with ARDS from COVID 19 pneumonia and bacterial HCAP. - Day 10/10 zosyn, will d/c after today's doses - Lasix 40 mg IV q6 x3 doses - CXR and ABG in AM - Continue proning - F/u ABG >> if PaO2/FiO2 less then 150, then prone again  Septic shock. - pressors to keep MAP > 65  Acute metabolic encephalopathy from sepsis, hypoxia. - RASS goal -4 - Scheduled klonopin, seroquel, oxycodone  Steroid induced hyperglycemia. - SSI with levemir  Hx of CAD s/p stent in 2019, HTN, HLD. - Continue plavix  Anemia of critical illness. - F/u CBC - Transfuse for Hb < 7  Best Practice:  DVT PROPHYLAXIS: Heparin SUP: Protonix  NUTRITION: TF MOBILITY: BR GOALS OF CARE: Full code DISPOSITION: ICU   LABS:   CMP Latest Ref Rng & Units 04/09/2019 04/09/2019 04/08/2019  Glucose 70 - 99 mg/dL - 171(H) -  BUN 8 - 23 mg/dL - 51(H) -  Creatinine 0.44 - 1.00 mg/dL - 1.22(H) -  Sodium 135 - 145 mmol/L 148(H) 149(H) 145  Potassium 3.5 - 5.1 mmol/L 3.8 4.1 4.2  Chloride 98 - 111 mmol/L - 108 -  CO2  22 - 32 mmol/L - 33(H) -  Calcium 8.9 - 10.3 mg/dL - 7.4(B) -  Total Protein 6.5 - 8.1 g/dL - - -  Total Bilirubin 0.3 - 1.2 mg/dL - - -  Alkaline Phos 38 - 126 U/L - - -  AST 15 - 41 U/L - - -  ALT 0 - 44 U/L - - -    CBC Latest Ref Rng & Units 04/09/2019 04/09/2019 04/08/2019  WBC 4.0 - 10.5 K/uL - 10.9(H) -  Hemoglobin 12.0 - 15.0 g/dL 4.4(H) 7.0(L) 8.8(L)  Hematocrit 36.0 - 46.0 % 28.0(L) 25.7(L) 26.0(L)  Platelets 150 - 400 K/uL - 269 -    ABG    Component Value Date/Time   PHART 7.279 (L) 04/09/2019 0450   PCO2ART 80.2 (HH) 04/09/2019 0450   PO2ART 188.0 (H) 04/09/2019 0450   HCO3 37.6 (H) 04/09/2019 0450   TCO2 40 (H) 04/09/2019 0450   ACIDBASEDEF 1.0 03/31/2019 1545   O2SAT 99.0 04/09/2019 0450    CBG  (last 3)  Recent Labs    04/09/19 0457 04/09/19 0958 04/09/19 1031  GLUCAP 157* 52* 144*   The patient is critically ill with multiple organ systems failure and requires high complexity decision making for assessment and support, frequent evaluation and titration of therapies, application of advanced monitoring technologies and extensive interpretation of multiple databases.   Critical Care Time devoted to patient care services described in this note is  31  Minutes. This time reflects time of care of this signee Dr Koren Bound. This critical care time does not reflect procedure time, or teaching time or supervisory time of PA/NP/Med student/Med Resident etc but could involve care discussion time.  Alyson Reedy, M.D. [facility] Pulmonary/Critical Care Medicine.

## 2019-04-09 NOTE — Plan of Care (Signed)
  Problem: Elimination: Goal: Will not experience complications related to urinary retention Outcome: Progressing   Problem: Pain Managment: Goal: General experience of comfort will improve Outcome: Progressing   Problem: Safety: Goal: Ability to remain free from injury will improve Outcome: Progressing   Problem: Skin Integrity: Goal: Risk for impaired skin integrity will decrease Outcome: Progressing   Problem: Activity: Goal: Risk for activity intolerance will decrease Outcome: Not Progressing Note: Pt critically ill and not responsive. Unable to increase activity.

## 2019-04-09 NOTE — Progress Notes (Signed)
eLink Physician-Brief Progress Note Patient Name: Ashley Moran DOB: January 08, 1951 MRN: [id number]   Date of Service  04/09/2019  HPI/Events of Note  Hypotension while already on Phenylephrine infusion. PCCM NP has already ordered Norepinephrine.  eICU Interventions  No new orders.        Thomasene Lot Marvens Hollars 04/09/2019, 8:04 PM

## 2019-04-09 NOTE — Progress Notes (Signed)
FPTS Social Progress Note   Following along socially, appreciate care by CCM and will resume care when patient stable for transfer out to floor.   Katha Cabal, DO PGY-1, [facility] 04/09/2019 7:30 AM    - Please contact intern pager [phone] as needed

## 2019-04-09 NOTE — Progress Notes (Signed)
eLink Physician-Brief Progress Note Patient Name: Ashley Moran DOB: 06-01-1950 MRN: [id number]   Date of Service  04/09/2019  HPI/Events of Note  Pt with sinus tachycardia not improved by a 100 mcg iv Fentanyl bolus. Saturation and blood pressure wnl.  eICU Interventions  Albumin 5 % 250 ml iv bolus x 1        Okoronkwo U Ogan 04/09/2019, 10:01 PM

## 2019-04-09 NOTE — Progress Notes (Signed)
RT NOTE:  Pt's head turned to the right. ETT secured throughout.  

## 2019-04-09 NOTE — Progress Notes (Signed)
Hypoglycemic Event  CBG: 67   Treatment: D50 25 mL (12.5 gm)  Symptoms: None  Follow-up CBG: Time:1240 CBG Result:139  Possible Reasons for Event: Inadequate meal intake  Comments/MD notified:MD Aware    Marin Comment

## 2019-04-10 ENCOUNTER — Inpatient Hospital Stay (HOSPITAL_COMMUNITY): Payer: BC Managed Care – PPO

## 2019-04-10 DIAGNOSIS — I469 Cardiac arrest, cause unspecified: Secondary | ICD-10-CM

## 2019-04-10 LAB — BASIC METABOLIC PANEL
Anion gap: 6 (ref 5–15)
BUN: 54 mg/dL — ABNORMAL HIGH (ref 8–23)
CO2: 35 mmol/L — ABNORMAL HIGH (ref 22–32)
Calcium: 8.8 mg/dL — ABNORMAL LOW (ref 8.9–10.3)
Chloride: 107 mmol/L (ref 98–111)
Creatinine, Ser: 1.89 mg/dL — ABNORMAL HIGH (ref 0.44–1.00)
GFR calc Af Amer: 31 mL/min — ABNORMAL LOW (ref >60)
GFR calc non Af Amer: 27 mL/min — ABNORMAL LOW (ref >60)
Glucose, Bld: 204 mg/dL — ABNORMAL HIGH (ref 70–99)
Potassium: 5.6 mmol/L — ABNORMAL HIGH (ref 3.5–5.1)
Sodium: 148 mmol/L — ABNORMAL HIGH (ref 135–145)

## 2019-04-10 LAB — MAGNESIUM: Magnesium: 2.2 mg/dL (ref 1.7–2.4)

## 2019-04-10 LAB — PHOSPHORUS: Phosphorus: 7.9 mg/dL — ABNORMAL HIGH (ref 2.5–4.6)

## 2019-04-10 LAB — CBC
HCT: 27 % — ABNORMAL LOW (ref 36.0–46.0)
Hemoglobin: 6.6 g/dL — CL (ref 12.0–15.0)
MCH: 21.8 pg — ABNORMAL LOW (ref 26.0–34.0)
MCHC: 24.4 g/dL — ABNORMAL LOW (ref 30.0–36.0)
MCV: 89.1 fL (ref 80.0–100.0)
Platelets: 265 10*3/uL (ref 150–400)
RBC: 3.03 MIL/uL — ABNORMAL LOW (ref 3.87–5.11)
RDW: 16.6 % — ABNORMAL HIGH (ref 11.5–15.5)
WBC: 15.5 10*3/uL — ABNORMAL HIGH (ref 4.0–10.5)
nRBC: 0.3 % — ABNORMAL HIGH (ref 0.0–0.2)

## 2019-04-10 LAB — GLUCOSE, CAPILLARY
Glucose-Capillary: 190 mg/dL — ABNORMAL HIGH (ref 70–99)
Glucose-Capillary: 180 mg/dL — ABNORMAL HIGH (ref 70–99)

## 2019-04-10 MED FILL — Medication: Qty: 1 | Status: AC

## 2019-04-18 ENCOUNTER — Telehealth: Payer: Self-pay

## 2019-04-18 NOTE — Telephone Encounter (Signed)
Received a dc in the mail from [facility].. The dc is for burial.  The doctor that should sign the dc will be Doctor Molli Knock.  I am forwarding this dc to the Pulmonary Office @ [place].

## 2019-04-30 NOTE — Progress Notes (Signed)
PULMONARY / CRITICAL CARE MEDICINE   NAME:  Amirrah Quigley, MRN:  [id number], DOB:  [birth year], LOS: 13 ADMISSION DATE:  03/22/2019, CONSULTATION DATE:  04/28/18 REFERRING MD: Dr. Lezlie Octave  , CHIEF COMPLAINT:  Dyspnea  BRIEF HISTORY:    69 yo female former smoker presented with dyspnea, abdominal pain, leg pain.  Had COVID exposure on 12/15.  Found to be COVID 19 positive with pneumonia.  Required intubation 1/02.  SIGNIFICANT PAST MEDICAL HISTORY   CAD, HTN, HLD  SIGNIFICANT EVENTS:  12/31 Transferred to ICU 01/02 VDRF 01/03 Proning 01/09 prone positioning  STUDIES:   CT chest 12/30 >> multifocal pneumonia V/Q scan 12/30 >> negative for PE  CULTURES:  Blood 12/30 >> Coag neg Staph  SARS CoV2 Ag 12/30 >> positive Pneumococcal Ag 12/31 >> negative Legionella Ag 12/31 >> negative Quantiferon gold 1/03 >> negative  ANTIMICROBIALS:  Ceftriaxone 12/30 >1/01 Azithromycin 12/30 >1/01 Vancomycin 1/02 >> 1/04 Zosyn 1/02 >> 1/12  COVID THERAPY:  Decadron 12/30 >> 1/08 Remdesivir 12/30 >> 12/31  LINES/TUBES:  ETT 1/02 >> Lt IJ CVL 1/02 >>  Rt radial A line 1/02 >>   CONSULTANTS:    SUBJECTIVE:  Hypotension overnight, levophed added Intermittent tachycardia  OBJECTIVE:   CONSTITUTIONAL: BP (!) 106/47   Pulse (!) 110   Temp (!) 94 F (34.4 C) (Rectal)   Resp 18   Wt 72.1 kg   SpO2 93%   BMI 32.10 kg/m   I/O last 3 completed shifts: In: 8130.4 [I.V.:3780.2; Other:100; QM/VH:8469; IV Piggyback:108.2] Out: 4480 [Urine:4480]  CVP:  [7 mmHg-13 mmHg] 13 mmHg  Vent Mode: PCV FiO2 (%):  [90 %-100 %] 90 % Set Rate:  [15 bmp-35 bmp] 15 bmp Vt Set:  [350 mL] 350 mL PEEP:  [16 cmH20] 16 cmH20 Plateau Pressure:  [28 cmH20-37 cmH20] 32 cmH20  PHYSICAL EXAM:.   General - Sedated, intubated, NAD HEENT: [place]/AT, PERRL, EOM-I and MMM Cardiac - RRR, Nl S1/S2 and -M/R/G Chest - Diffuse rales Abdomen - Soft, NT, ND and +BS Extremities - 1+ edema persists Skin -  Intact Neuro - RASS -3  I reviewed CXR myself, ETT is in a good position, infiltrate noted  Discussed with bedside RN and RT  RESOLVED PROBLEM LIST    ASSESSMENT AND PLAN    Acute hypoxic/hypercapnic respiratory failure with ARDS from COVID 19 pneumonia and bacterial HCAP. - D/C zosyn 10 day course complete - Hold further diureses given hemodynamics and renal function, but does need additional volume negative - CXR and ABG in AM - Continue to cycle proning - Adjust vent for ABG - F/u ABG >> if PaO2/FiO2 less then 150, then prone again  Septic shock. - Levophed - Neo - Vaso - Check cortisol level - Stress dose steroids ordered  Acute metabolic encephalopathy from sepsis, hypoxia. - RASS goal -4 - Scheduled klonopin, seroquel, oxycodone - Versed and morphine drips ordered given vent needs  Steroid induced hyperglycemia. - SSI with levemir  Hx of CAD s/p stent in 2019, HTN, HLD. - Continue plavix  Anemia of critical illness. - F/u CBC - Transfuse for Hb < 7  Family updated over the phone  Best Practice:  DVT PROPHYLAXIS: Heparin SUP: Protonix  NUTRITION: TF MOBILITY: BR GOALS OF CARE: Full code DISPOSITION: ICU   LABS:   CMP Latest Ref Rng & Units 22-Apr-2019 04/09/2019 04/09/2019  Glucose 70 - 99 mg/dL 629(B) 284(X) -  BUN 8 - 23 mg/dL 32(G) 40(N) -  Creatinine 0.44 - 1.00  mg/dL 1.89(H) 1.37(H) -  Sodium 135 - 145 mmol/L 148(H) 147(H) 149(H)  Potassium 3.5 - 5.1 mmol/L 5.6(H) 4.9 4.0  Chloride 98 - 111 mmol/L 107 104 -  CO2 22 - 32 mmol/L 35(H) 37(H) -  Calcium 8.9 - 10.3 mg/dL 8.8(L) 8.8(L) -  Total Protein 6.5 - 8.1 g/dL - - -  Total Bilirubin 0.3 - 1.2 mg/dL - - -  Alkaline Phos 38 - 126 U/L - - -  AST 15 - 41 U/L - - -  ALT 0 - 44 U/L - - -    CBC Latest Ref Rng & Units 2019-05-07 04/09/2019 04/09/2019  WBC 4.0 - 10.5 K/uL 15.5(H) 15.5(H) -  Hemoglobin 12.0 - 15.0 g/dL 6.6(LL) 7.6(L) 7.8(L)  Hematocrit 36.0 - 46.0 % 27.0(L) 28.0(L) 23.0(L)   Platelets 150 - 400 K/uL 265 328 -    ABG    Component Value Date/Time   PHART 7.335 (L) 04/09/2019 1157   PCO2ART 66.6 (HH) 04/09/2019 1157   PO2ART 59.0 (L) 04/09/2019 1157   HCO3 35.5 (H) 04/09/2019 1157   TCO2 38 (H) 04/09/2019 1157   ACIDBASEDEF 1.0 03/31/2019 1545   O2SAT 87.0 04/09/2019 1157    CBG (last 3)  Recent Labs    04/09/19 2319 05-07-19 0313 May 07, 2019 0741  GLUCAP 148* 190* 180*   The patient is critically ill with multiple organ systems failure and requires high complexity decision making for assessment and support, frequent evaluation and titration of therapies, application of advanced monitoring technologies and extensive interpretation of multiple databases.   Critical Care Time devoted to patient care services described in this note is  33  Minutes. This time reflects time of care of this signee Dr Jennet Maduro. This critical care time does not reflect procedure time, or teaching time or supervisory time of PA/NP/Med student/Med Resident etc but could involve care discussion time.  Rush Farmer, M.D. [facility] Pulmonary/Critical Care Medicine.

## 2019-04-30 NOTE — Progress Notes (Signed)
Notified by lab of critical HGB 6.6. Dr Marchelle Gearing notified, no new immediate order, residents to assess.

## 2019-04-30 NOTE — Progress Notes (Signed)
Pt in steady, though persistent instability, state of health. Pt deteriorated rapidly after 8 am, MD notified and then code blue called as patients hemodynamic state plummeted. Kim RN at bs assisting before code. Code Blue terminated at 8:38 and MD pronounced TOD. MD notified daughter and updated. I made follow-up call 15 min later and emotional support given.

## 2019-04-30 NOTE — Progress Notes (Signed)
During rounds, patient suffered a bradycardic cardiac arrest.  CPR x20 minutes with nothing but asystole.  Bedside U/S showed lung marking on both sides.  Echo of the heart with no significant tamponade.  Electrolytes and acidosis addressed.  ACLS protocol followed with no success.  After 20 minutes patient remained asystolic and the code was called.  Daughter called and informed of events.  Condolences given.  She asked what she needs to do and I asked her to take a moment then call us back with the name of the funeral home and the ICU phone number was reiterated to her again.  Alyson Reedy, M.D. [facility] Pulmonary/Critical Care Medicine

## 2019-04-30 NOTE — Progress Notes (Signed)
eLink Physician-Brief Progress Note Patient Name: Ashley Moran DOB: [birth year] MRN: [id number]   Date of Service  04/11/19  HPI/Events of Note  Hypothermia  eICU Interventions  Bear hugger ordered        Migdalia Dk 2019-04-11, 4:23 AM

## 2019-04-30 NOTE — Progress Notes (Signed)
Pt's daughter and son-in-law at bs. Emotional support given. Pt's purse, cell phone, jewelry and dentures given to family with Sharrie Rothman RN witness.

## 2019-04-30 NOTE — Progress Notes (Signed)
Responded to unit page to support staff and possibly family upon arrivial if needed.  Upon entering the unit.  I spoke with patient nurse who agreed that there was no reason to visit due to patient passing early A.M.  Family did not ask for chaplain.  The call was routine protocal..  Chaplain available to staff as needed.     Venida Jarvis, [place], [facility], Pager [phone]

## 2019-04-30 NOTE — Death Summary Note (Signed)
DEATH SUMMARY   Patient Details  Name: Ashley Moran MRN: [id number] DOB: Mar 13, 1951  Admission/Discharge Information   Admit Date:  03/21/2019  Date of Death:    Time of Death:    Length of Stay: 13  Referring Physician: Patient, No Pcp Per   Reason(s) for Hospitalization  COVID-19 induced hypoxemic respiratory failure  Diagnoses  Preliminary cause of death:   COVID-19 induced acute hypoxemic respiratory failure Secondary Diagnoses (including complications and co-morbidities):  Active Problems:   COVID-19   ARDS (adult respiratory distress syndrome) (HCC)   Acute respiratory failure with hypoxia (HCC)   Pneumonia due to COVID-19 virus Asystole  Brief Hospital Course (including significant findings, care, treatment, and services provided and events leading to death)  69 yo female former smoker presented with dyspnea, abdominal pain, leg pain.  Had COVID exposure on 12/15.  Found to be COVID 19 positive with pneumonia.  Required intubation 1/02.  ARDS persisted.  On 1/12 the patient was becoming more bradycardic.  Made supine for instability and in the process suffered a cardiac arrest.  Patient did not survive code.  Family notified.  Pertinent Labs and Studies  Significant Diagnostic Studies DG Abd 1 View  Result Date: 03/12/2019 CLINICAL DATA:  Abdominal pain for 3 days. EXAM: ABDOMEN - 1 VIEW COMPARISON:  CT earlier this day at 11:26 a.m. FINDINGS: Normal bowel gas pattern. No bowel dilatation to suggest obstruction. No free air. Vascular calcifications. Patchy bibasilar airspace disease. No acute osseous abnormalities are seen. IMPRESSION: 1. Normal bowel gas pattern. No free air. No acute findings radiographically from CT earlier this day. 2. Patchy bibasilar airspace disease. Electronically Signed   By: Narda RutherfordMelanie  Sanford M.D.   On: 03/22/2019 23:31   CT Chest Wo Contrast  Result Date: 03/12/2019 CLINICAL DATA:  69 year old female with flank pain. Concern for kidney  stone. Positive COVID-19. EXAM: CT CHEST, ABDOMEN AND PELVIS WITHOUT CONTRAST TECHNIQUE: Multidetector CT imaging of the chest, abdomen and pelvis was performed following the standard protocol without IV contrast. COMPARISON:  Chest radiograph dated 03/13/2019 and CT of the chest abdomen pelvis dated 09/12/2017. FINDINGS: Evaluation of this exam is limited in the absence of intravenous contrast. CT CHEST FINDINGS Cardiovascular: There is no cardiomegaly or pericardial effusion. Three-vessel coronary vascular calcification noted. There is moderate atherosclerotic calcification of the thoracic aorta. No aneurysmal dilatation. The central pulmonary arteries appear grossly unremarkable on this noncontrast CT. Mediastinum/Nodes: No hilar or mediastinal adenopathy. Evaluation however is limited in the absence of intravenous contrast. The esophagus and the thyroid gland are grossly unremarkable as visualized. No mediastinal fluid collection. Lungs/Pleura: Diffuse bilateral ground-glass and hazy airspace opacities most consistent with multifocal pneumonia. Clinical correlation and follow-up to resolution recommended. There is mild bilateral lower lobe bronchiectasis. There is no pleural effusion or pneumothorax. The central airways are patent. Musculoskeletal: No chest wall mass or suspicious bone lesions identified. CT ABDOMEN PELVIS FINDINGS Evaluation of this exam is limited due to respiratory motion artifact. No intra-abdominal free air or free fluid. Hepatobiliary: Diffuse fatty infiltration of the liver. No intrahepatic biliary ductal dilatation. Stable 4 mm focus of calcification associated with the gallbladder similar to prior CT. No pericholecystic fluid or evidence of acute cholecystitis by CT. Pancreas: Unremarkable. No pancreatic ductal dilatation or surrounding inflammatory changes. Spleen: Normal in size without focal abnormality. Adrenals/Urinary Tract: The adrenal glands are unremarkable. There is no  hydronephrosis or nephrolithiasis on either side. The visualized ureters and urinary bladder appear unremarkable. Stomach/Bowel: Moderate stool throughout the colon. No  bowel obstruction or active inflammation. The appendix is normal. Vascular/Lymphatic: Advanced aortoiliac atherosclerotic disease. There is a 2.3 cm infrarenal abdominal aortic ectasia. This is similar to prior CT. No portal venous gas. There is no adenopathy. Reproductive: Probable small calcified uterine fibroid. No adnexal masses. Other: None Musculoskeletal: No acute or significant osseous findings. IMPRESSION: 1. Multifocal pneumonia. Clinical correlation and follow-up to resolution recommended. 2. No acute intra-abdominopelvic pathology. Specifically No hydronephrosis or nephrolithiasis. 3. Fatty liver. 4. No bowel obstruction or active inflammation. Normal appendix. 5. Aortic Atherosclerosis (ICD10-I70.0). Electronically Signed   By: Anner Crete M.D.   On: 03/20/2019 11:49   NM Pulmonary Perfusion  Result Date: 03/25/2019 CLINICAL DATA:  Shortness of breath EXAM: NUCLEAR MEDICINE PERFUSION LUNG SCAN TECHNIQUE: Perfusion images were obtained in multiple projections after intravenous injection of radiopharmaceutical. Ventilation scans intentionally deferred if perfusion scan and chest x-ray adequate for interpretation during COVID 19 epidemic. RADIOPHARMACEUTICALS:  1.6 mCi Tc-60m MAA IV COMPARISON:  CT chest dated March 28, 2019 FINDINGS: There is inhomogeneous radiotracer distribution on the perfusion portion of the study. There is no large defect. IMPRESSION: Inhomogeneous distribution of radiotracer favored to be secondary to the patient's pulmonary disease as seen on recent CT. There is no convincing evidence for a pulmonary embolism. Electronically Signed   By: Constance Holster M.D.   On: 03/24/2019 19:02   DG Chest Port 1 View  Result Date: 04/27/19 CLINICAL DATA:  COVID positive EXAM: PORTABLE CHEST 1 VIEW  COMPARISON:  April 09, 2019 FINDINGS: The heart size and mediastinal contours are unchanged. Endotracheal tube is seen with the tip at 1.5 cm above the carina. NG tube is seen within the proximal stomach. A left-sided central venous catheter seen with the tip in the mid SVC. There is interval worsening in the extensive multifocal patchy/reticulonodular opacities throughout both lungs. IMPRESSION: Interval worsening in the multifocal airspace opacities. Electronically Signed   By: Prudencio Pair M.D.   On: 2019-04-27 06:29   DG Chest Port 1 View  Result Date: 04/09/2019 CLINICAL DATA:  Respiratory failure EXAM: PORTABLE CHEST 1 VIEW COMPARISON:  April 08, 2019 FINDINGS: The heart size and mediastinal contours are unchanged. ET tube is 3.5 cm above the carina. A left-sided PICC catheter seen with the tip at the superior cavoatrial junction. NG tube is seen coursing below the diaphragm. No significant interval change in the patchy/hazy airspace opacities throughout both lungs. IMPRESSION: Lines and tubes in unchanged position. No significant improvement in bilateral patchy/hazy airspace opacities. Electronically Signed   By: Prudencio Pair M.D.   On: 04/09/2019 06:10   DG Chest Port 1 View  Result Date: 04/08/2019 CLINICAL DATA:  Respiratory failure.  COVID-19 pneumonia.  ARDS. EXAM: PORTABLE CHEST 1 VIEW 10:15 a.m. COMPARISON:  04/08/2019 at 12:17 a.m. and 04/07/2019 and 04/06/2019 FINDINGS: Endotracheal tube in good position 4.7 cm above the carina. NG tube tip below the diaphragm. Central venous catheter tip in the superior vena cava in good position above the cavoatrial junction, unchanged. There has been a significant improvement in the pulmonary infiltrates bilaterally. A portion of this apparent improvement is probably due to increased expansion of both lungs on the current radiograph. Heart size and vascularity are normal. Aortic atherosclerosis. No bone abnormality. IMPRESSION: Significant improvement  in the bilateral pulmonary infiltrates seen on the study done earlier today. Aortic Atherosclerosis (ICD10-I70.0). Electronically Signed   By: Lorriane Shire M.D.   On: 04/08/2019 10:50   DG Chest Port 1 View  Result Date: 04/08/2019 CLINICAL  DATA:  69 year old female with positive COVID-19. EXAM: PORTABLE CHEST 1 VIEW COMPARISON:  Chest radiograph dated 04/07/2019. FINDINGS: Endotracheal tube with tip approximately 3 cm above the carina in similar position. Enteric tube extends below the diaphragm with tip beyond the inferior margin of the image. Left IJ central venous line with tip over central SVC in similar position. Extensive bilateral confluent and nodular airspace opacities with no significant interval change. No large pleural effusion or pneumothorax. Stable cardiac silhouette. No acute osseous pathology. IMPRESSION: 1. No significant interval change in the appearance of the lungs compared to the earlier radiograph. 2. Stable positioning of the support devices. Electronically Signed   By: Elgie Collard M.D.   On: 04/08/2019 00:43   DG Chest Port 1 View  Result Date: 04/07/2019 CLINICAL DATA:  Ventilator dependence.  COVID positive. EXAM: PORTABLE CHEST 1 VIEW COMPARISON:  04/06/2019 FINDINGS: 0529 hours. Endotracheal tube tip is 2.1 cm above the base of the carina. Left IJ central line tip overlies the distal SVC level. The NG tube passes into the stomach although the distal tip position is not included on the film. Patchy bilateral airspace disease is progressive in the interval, including peripheral left mid lung, peripheral right mid lung and right base. The cardiopericardial silhouette is within normal limits for size. Bones are demineralized. Telemetry leads overlie the chest. IMPRESSION: 1. Interval progression of bilateral patchy airspace disease. 2. Stable support apparatus Electronically Signed   By: Kennith Center M.D.   On: 04/07/2019 06:55   AM DG Chest Port 1 View  Result Date:  04/06/2019 CLINICAL DATA:  ARDS.  COVID-19. EXAM: PORTABLE CHEST 1 VIEW COMPARISON:  04/05/2019. FINDINGS: Endotracheal tube, NG tube, and left IJ line in stable position. Heart size stable. Diffuse bilateral multifocal infiltrates again noted without significant change. Small right pleural effusion cannot be excluded. No pneumothorax. IMPRESSION: 1.  Lines and tubes stable position. 2. Diffuse bilateral multifocal infiltrates again noted without significant change. Small right pleural effusion cannot be excluded. Electronically Signed   By: Maisie Fus  Register   On: 04/06/2019 06:22   DG CHEST PORT 1 VIEW  Result Date: 04/05/2019 CLINICAL DATA:  Respiratory failure EXAM: PORTABLE CHEST 1 VIEW COMPARISON:  Two days ago FINDINGS: Endotracheal tube tip at the clavicular heads. The enteric tube at least reaches the stomach. Left IJ line with tip at the upper cavoatrial junction. Bilateral pneumonia that appears less dense and extensive than prior. No evidence of effusion or pneumothorax. Normal heart size. IMPRESSION: 1. Stable hardware positioning. 2. Bilateral pneumonia with mild improvement. Electronically Signed   By: Marnee Spring M.D.   On: 04/05/2019 05:10   DG Chest Port 1 View  Result Date: 04/03/2019 CLINICAL DATA:  Acute respiratory failure.  COVID-19. EXAM: PORTABLE CHEST 1 VIEW COMPARISON:  04/02/2019.  CT 03/04/2019. FINDINGS: Endotracheal tube, left IJ line, NG tube in stable position. Heart size stable. Diffuse severe bilateral pulmonary infiltrates again noted without interim change. No pleural effusion or pneumothorax. IMPRESSION: 1.  Lines and tubes stable position. 2. Persistent diffuse severe bilateral pulmonary infiltrates again noted. No interim change Electronically Signed   By: Maisie Fus  Register   On: 04/03/2019 06:32   DG CHEST PORT 1 VIEW  Result Date: 04/02/2019 CLINICAL DATA:  Endotracheal tube EXAM: PORTABLE CHEST 1 VIEW COMPARISON:  Yesterday FINDINGS: Endotracheal tube tip at the  clavicular heads. Left IJ line with tip at the upper cavoatrial junction. Enteric tube tip at least reaches the stomach. Extensive bilateral ground-glass opacity correlating  with history of COVID-19. No visible effusion or pneumothorax. IMPRESSION: Stable hardware positioning and bilateral pneumonia. Electronically Signed   By: Marnee Spring M.D.   On: 04/02/2019 07:03   DG Chest Port 1 View  Result Date: 04/01/2019 CLINICAL DATA:  Endotracheal tube placement EXAM: PORTABLE CHEST 1 VIEW COMPARISON:  Film from earlier in the same day. FINDINGS: Endotracheal tube, gastric catheter and left jugular central line are again seen and stable. Diffuse bilateral infiltrates are noted slightly improved when compared with the prior exam. IMPRESSION: Improvement in bilateral infiltrates consistent with the given clinical history. Tubes and lines as described stable in appearance. Electronically Signed   By: Alcide Clever M.D.   On: 04/01/2019 18:37   DG Chest Port 1 View  Result Date: 04/01/2019 CLINICAL DATA:  COVID. Endotracheally intubated. EXAM: PORTABLE CHEST 1 VIEW COMPARISON:  Radiograph yesterday. CT 2019-04-08 FINDINGS: Endotracheal tube tip at the thoracic inlet. Enteric tube in place with tip below the diaphragm not included in the field of view. Left internal jugular central venous catheter tip in the mid SVC. Bilateral airspace disease is progressed since yesterday. No visualized pneumothorax. Unchanged mediastinal contours. IMPRESSION: 1. Endotracheal tube tip at the thoracic inlet. 2. Progressive bilateral airspace disease consistent with COVID pneumonia. Electronically Signed   By: Narda Rutherford M.D.   On: 04/01/2019 06:30   DG CHEST PORT 1 VIEW  Result Date: 03/31/2019 CLINICAL DATA:  COVID+; respiratory complication. Hx of coronary artery disease, essential hypertension, left heart cath and coronary angiography(2019). Former smoker(2019). EXAM: PORTABLE CHEST 1 VIEW COMPARISON:  Chest radiograph  03/31/2019 at 1:29 p.m. FINDINGS: Interval placement of a left central venous catheter with tip projecting at the cavoatrial junction. Otherwise stable support apparatus. Unchanged cardiomediastinal contours. Stable appearance of the lungs with diffuse bilateral airspace disease. No pneumothorax or large pleural effusion. IMPRESSION: 1. Interval placement of left central venous catheter with tip projecting at the cavoatrial junction. 2. Otherwise stable chest. Electronically Signed   By: Emmaline Kluver M.D.   On: 03/31/2019 17:56   Portable Chest x-ray  Result Date: 03/31/2019 CLINICAL DATA:  Check endotracheal tube placement EXAM: PORTABLE CHEST 1 VIEW COMPARISON:  03/31/2019 FINDINGS: Cardiac shadow is stable. Endotracheal tube and gastric catheter are now seen in satisfactory position. Persistent bilateral infiltrates are seen similar to that noted on the prior exam image. No sizable effusion is seen. No bony abnormality is noted. IMPRESSION: Endotracheal tube and gastric catheter in satisfactory position. Stable bilateral infiltrates. Electronically Signed   By: Alcide Clever M.D.   On: 03/31/2019 13:46   DG CHEST PORT 1 VIEW  Result Date: 03/31/2019 CLINICAL DATA:  COVID positive, pneumonia EXAM: PORTABLE CHEST 1 VIEW COMPARISON:  Apr 08, 2019 FINDINGS: Severe worsening bilateral consolidative airspace opacities when compared to 2019/04/08 representing progressive diffuse pneumonia. No large effusion or pneumothorax. Trachea midline. Cardiomediastinal contours are obscured by the airspace process. Aorta atherosclerotic. IMPRESSION: Severe worsening bilateral consolidative pneumonia pattern. Electronically Signed   By: Judie Petit.  Shick M.D.   On: 03/31/2019 09:42   DG Chest Port 1 View  Result Date: 04-08-2019 CLINICAL DATA:  oxygen desaturation. Shortness of breath. History of coronary artery disease. EXAM: PORTABLE CHEST 1 VIEW COMPARISON:  09/12/2017 FINDINGS: Normal heart size. Multifocal, bilateral  pulmonary opacities are noted and concerning for multifocal infection. This is most notable within the left midlung, left base and right upper lobe. No pleural effusion or edema. No acute osseous abnormality. IMPRESSION: 1. Findings compatible with bilateral multifocal pneumonia. Electronically Signed  By: Signa Kellaylor  Stroud M.D.   On: Dec 11, 2018 10:59   CT Renal Stone Study  Result Date: Dec 11, 2018 CLINICAL DATA:  69 year old female with flank pain. Concern for kidney stone. Positive COVID-19. EXAM: CT CHEST, ABDOMEN AND PELVIS WITHOUT CONTRAST TECHNIQUE: Multidetector CT imaging of the chest, abdomen and pelvis was performed following the standard protocol without IV contrast. COMPARISON:  Chest radiograph dated Dec 11, 2018 and CT of the chest abdomen pelvis dated 09/12/2017. FINDINGS: Evaluation of this exam is limited in the absence of intravenous contrast. CT CHEST FINDINGS Cardiovascular: There is no cardiomegaly or pericardial effusion. Three-vessel coronary vascular calcification noted. There is moderate atherosclerotic calcification of the thoracic aorta. No aneurysmal dilatation. The central pulmonary arteries appear grossly unremarkable on this noncontrast CT. Mediastinum/Nodes: No hilar or mediastinal adenopathy. Evaluation however is limited in the absence of intravenous contrast. The esophagus and the thyroid gland are grossly unremarkable as visualized. No mediastinal fluid collection. Lungs/Pleura: Diffuse bilateral ground-glass and hazy airspace opacities most consistent with multifocal pneumonia. Clinical correlation and follow-up to resolution recommended. There is mild bilateral lower lobe bronchiectasis. There is no pleural effusion or pneumothorax. The central airways are patent. Musculoskeletal: No chest wall mass or suspicious bone lesions identified. CT ABDOMEN PELVIS FINDINGS Evaluation of this exam is limited due to respiratory motion artifact. No intra-abdominal free air or free fluid.  Hepatobiliary: Diffuse fatty infiltration of the liver. No intrahepatic biliary ductal dilatation. Stable 4 mm focus of calcification associated with the gallbladder similar to prior CT. No pericholecystic fluid or evidence of acute cholecystitis by CT. Pancreas: Unremarkable. No pancreatic ductal dilatation or surrounding inflammatory changes. Spleen: Normal in size without focal abnormality. Adrenals/Urinary Tract: The adrenal glands are unremarkable. There is no hydronephrosis or nephrolithiasis on either side. The visualized ureters and urinary bladder appear unremarkable. Stomach/Bowel: Moderate stool throughout the colon. No bowel obstruction or active inflammation. The appendix is normal. Vascular/Lymphatic: Advanced aortoiliac atherosclerotic disease. There is a 2.3 cm infrarenal abdominal aortic ectasia. This is similar to prior CT. No portal venous gas. There is no adenopathy. Reproductive: Probable small calcified uterine fibroid. No adnexal masses. Other: None Musculoskeletal: No acute or significant osseous findings. IMPRESSION: 1. Multifocal pneumonia. Clinical correlation and follow-up to resolution recommended. 2. No acute intra-abdominopelvic pathology. Specifically No hydronephrosis or nephrolithiasis. 3. Fatty liver. 4. No bowel obstruction or active inflammation. Normal appendix. 5. Aortic Atherosclerosis (ICD10-I70.0). Electronically Signed   By: Elgie CollardArash  Radparvar M.D.   On: Dec 11, 2018 11:49   VAS US LOWER EXTREMITY VENOUS (DVT)  Result Date: 03/30/2019  Lower Venous Study Indications: COVID-19 positive. Acute respiratory failure.  Limitations: Body habitus, poor ultrasound/tissue interface and Suboptimal patient position, patient movement secondary to pain. Comparison Study: No prior study. Performing Technologist: Gertie FeyMichelle Simonetti MHA, RDMS, RVT, RDCS  Examination Guidelines: A complete evaluation includes B-mode imaging, spectral Doppler, color Doppler, and power Doppler as needed of all  accessible portions of each vessel. Bilateral testing is considered an integral part of a complete examination. Limited examinations for reoccurring indications may be performed as noted.  +---------+---------------+---------+-----------+----------+--------------+ RIGHT    CompressibilityPhasicitySpontaneityPropertiesThrombus Aging +---------+---------------+---------+-----------+----------+--------------+ CFV                                                   Not visualized +---------+---------------+---------+-----------+----------+--------------+ SFJ  Not visualized +---------+---------------+---------+-----------+----------+--------------+ FV Prox  Full                                                        +---------+---------------+---------+-----------+----------+--------------+ FV Mid   Full                                                        +---------+---------------+---------+-----------+----------+--------------+ FV DistalFull                                                        +---------+---------------+---------+-----------+----------+--------------+ PFV                                                   Not visualized +---------+---------------+---------+-----------+----------+--------------+ POP      Full           Yes      Yes                                 +---------+---------------+---------+-----------+----------+--------------+ PTV      Full                                                        +---------+---------------+---------+-----------+----------+--------------+ PERO                                                  Not visualized +---------+---------------+---------+-----------+----------+--------------+   +---------+---------------+---------+-----------+----------+--------------+ LEFT     CompressibilityPhasicitySpontaneityPropertiesThrombus Aging  +---------+---------------+---------+-----------+----------+--------------+ CFV                                                   Not visualized +---------+---------------+---------+-----------+----------+--------------+ SFJ                                                   Not visualized +---------+---------------+---------+-----------+----------+--------------+ FV Prox                                               Not visualized +---------+---------------+---------+-----------+----------+--------------+ FV Mid  Not visualized +---------+---------------+---------+-----------+----------+--------------+ FV Distal                                             Not visualized +---------+---------------+---------+-----------+----------+--------------+ PFV                                                   Not visualized +---------+---------------+---------+-----------+----------+--------------+ POP      Full           Yes      Yes                                 +---------+---------------+---------+-----------+----------+--------------+ PTV      Full                                                        +---------+---------------+---------+-----------+----------+--------------+ PERO     Full                                                        +---------+---------------+---------+-----------+----------+--------------+     Summary: Right: There is no evidence of deep vein thrombosis in the lower extremity. However, portions of this examination were limited- see technologist comments above. No cystic structure found in the popliteal fossa. Left: There is no evidence of deep vein thrombosis in the lower extremity. However, portions of this examination were limited- see technologist comments above. No cystic structure found in the popliteal fossa.  *See table(s) above for measurements and observations. Electronically  signed by Waverly Ferrari MD on 03/30/2019 at 6:26:32 AM.    Final     Microbiology Recent Results (from the past 240 hour(s))  MRSA PCR Screening     Status: None   Collection Time: 04/02/19  3:56 PM   Specimen: Nasal Mucosa; Nasopharyngeal  Result Value Ref Range Status   MRSA by PCR NEGATIVE NEGATIVE Final    Comment:        The GeneXpert MRSA Assay (FDA approved for NASAL specimens only), is one component of a comprehensive MRSA colonization surveillance program. It is not intended to diagnose MRSA infection nor to guide or monitor treatment for MRSA infections. Performed at [facility], [address], [place], [place] [address]     Lab Basic Metabolic Panel: Recent Labs  Lab 04/04/19 [phone] 04/04/19 6256 04/06/19 0354 04/07/19 0514 04/08/19 0424 04/09/19 0438 04/09/19 0450 04/09/19 1157 04/09/19 2014 04/09/2019 0343  NA  --   --  145 146* 147* 149* 148* 149* 147* 148*  K  --   --  3.8 4.1 3.6 4.1 3.8 4.0 4.9 5.6*  CL  --    < > 100 102 104 108  --   --  104 107  CO2  --    < > 34* 36* 37* 33*  --   --  37* 35*  GLUCOSE  --    < >  243* 233* 130* 171*  --   --  161* 204*  BUN  --    < > 60* 54* 52* 51*  --   --  50* 54*  CREATININE  --    < > 1.35* 1.18* 1.00 1.22*  --   --  1.37* 1.89*  CALCIUM  --    < > 8.7* 8.9 8.1* 8.4*  --   --  8.8* 8.8*  MG 2.0  --  2.0  --   --   --   --   --   --  2.2  PHOS 2.6  --  3.9  --   --   --   --   --   --  7.9*   < > = values in this interval not displayed.   Liver Function Tests: No results for input(s): AST, ALT, ALKPHOS, BILITOT, PROT, ALBUMIN in the last 168 hours. No results for input(s): LIPASE, AMYLASE in the last 168 hours. No results for input(s): AMMONIA in the last 168 hours. CBC: Recent Labs  Lab 04/07/19 0514 04/08/19 1138 04/09/19 0438 04/09/19 0450 04/09/19 1157 04/09/19 2014 14-Apr-2019 0605  WBC 14.7* 14.7* 10.9*  --   --  15.5* 15.5*  NEUTROABS  --  13.2*  --   --   --   --   --   HGB 8.4*  7.4* 7.0* 9.5* 7.8* 7.6* 6.6*  HCT 29.3* 27.7* 25.7* 28.0* 23.0* 28.0* 27.0*  MCV 74.4* 80.8 80.6  --   --  80.7 89.1  PLT 253 256 269  --   --  328 265   Cardiac Enzymes: No results for input(s): CKTOTAL, CKMB, CKMBINDEX, TROPONINI in the last 168 hours. Sepsis Labs: Recent Labs  Lab 04/06/19 0354 04/07/19 0514 04/08/19 1138 04/09/19 0438 04/09/19 2014 14-Apr-2019 0605  PROCALCITON 1.17 0.62  --   --   --   --   WBC 15.0* 14.7* 14.7* 10.9* 15.5* 15.5*  LATICACIDVEN  --   --   --   --  1.4  --     Procedures/Operations     Koren Bound Apr 14, 2019, 8:53 AM

## 2019-04-30 NOTE — Procedures (Signed)
CPR Note  Bradycardia then asystole, ACLS x20 minutes and remained asystolic.  Bedside U/S of heart and lungs with no tamponade or PTX noted.  Code called and time of death noted.  Please see code sheet for details.  Alyson Reedy, M.D. [facility] Pulmonary/Critical Care Medicine.

## 2019-04-30 DEATH — deceased

## 2019-05-28 IMAGING — CT CT ANGIO CHEST-ABD-PELV FOR DISSECTION W/ AND WO/W CM
2 of 9 series · 11 of 36 positions shown, 15 images · IV contrast (OMNI)
Comparison: Recent CTA examination 07/30/2012

CLINICAL DATA: 66-year-old with chest pain, cardiac etiology
suspected.

EXAM:
CT ANGIOGRAPHY CHEST, ABDOMEN AND PELVIS
TECHNIQUE: Multidetector CT imaging through the chest, abdomen and pelvis was
performed using the standard protocol during bolus administration of
intravenous contrast. Multiplanar reconstructed images and MIPs were
obtained and reviewed to evaluate the vascular anatomy.
CONTRAST:  100mL [id number] IOPAMIDOL ([id number]) INJECTION 76%

[Series 6: dissection 3.0 i30f 3 · axial · 0.66mm/px · z∈[+735,+1257]mm · 10 of 204 slices shown, 13 images]
[im 15/204  mediastinal]
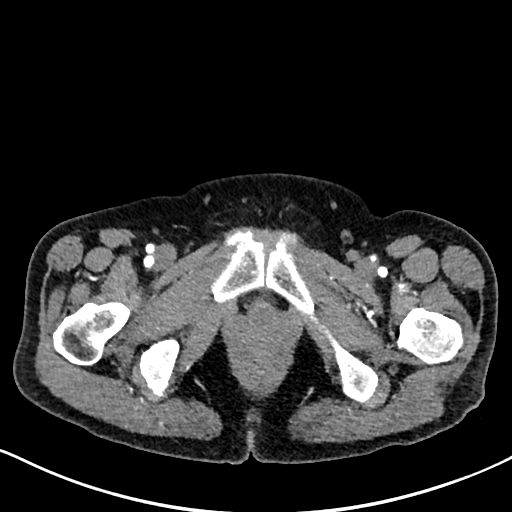
[im 15/204  bone]
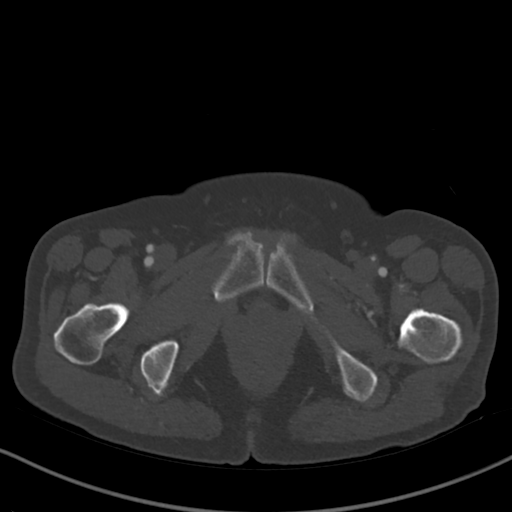
[im 44/204  mediastinal]
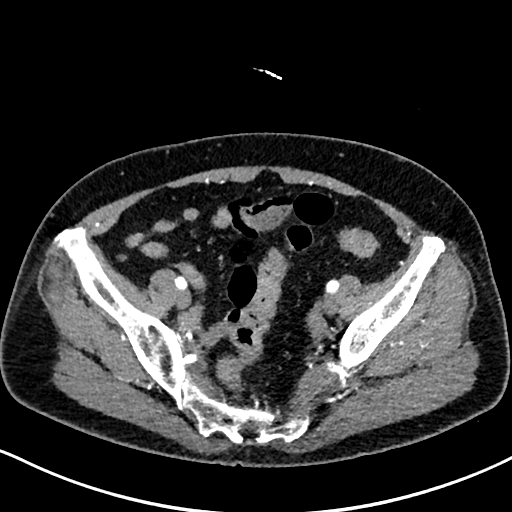
[im 73/204  mediastinal]
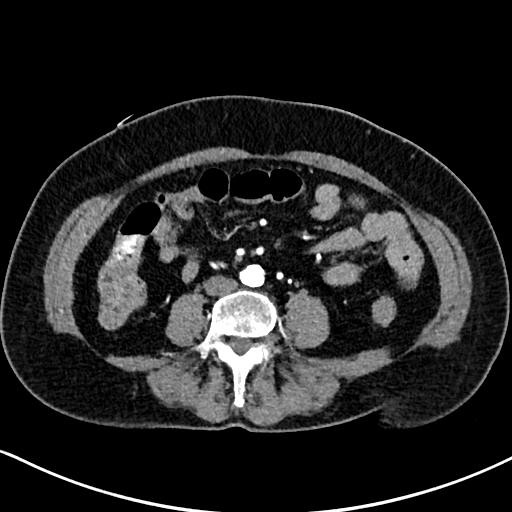
[im 88/204  mediastinal]
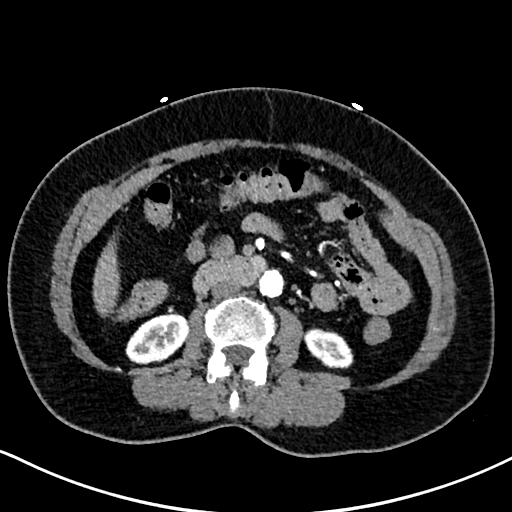
[im 117/204  mediastinal]
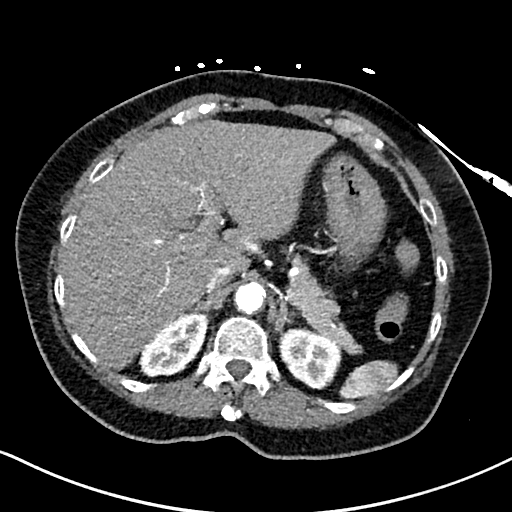
[im 131/204  mediastinal]
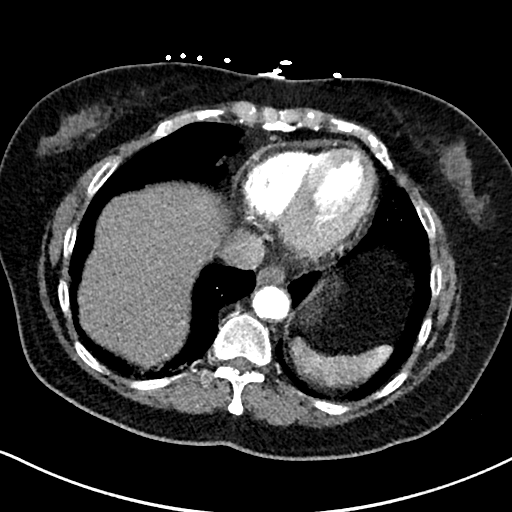
[im 146/204  lung]
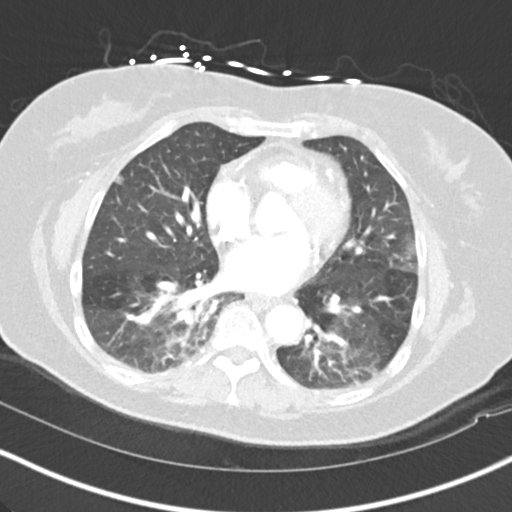
[im 160/204  mediastinal]
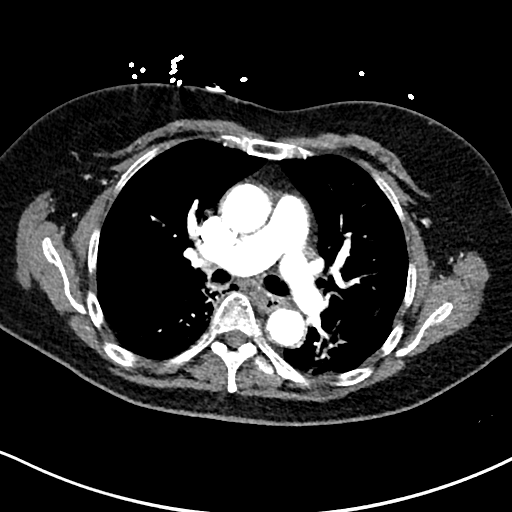
[im 160/204  lung]
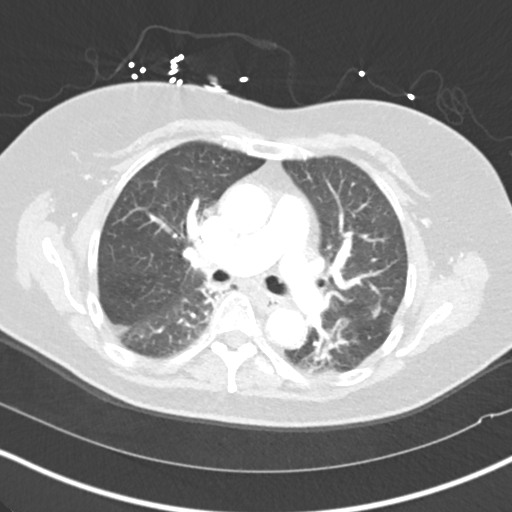
[im 175/204  lung]
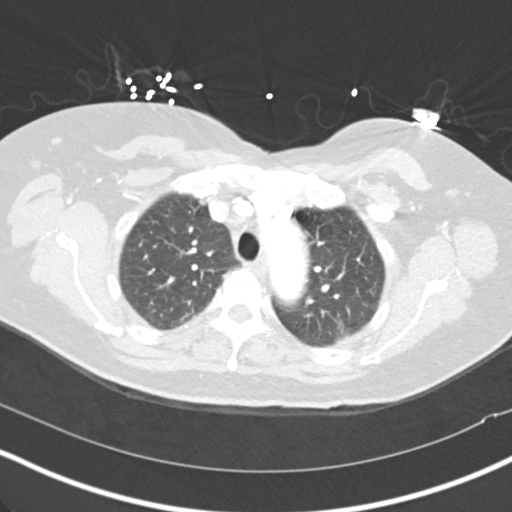
[im 189/204  mediastinal]
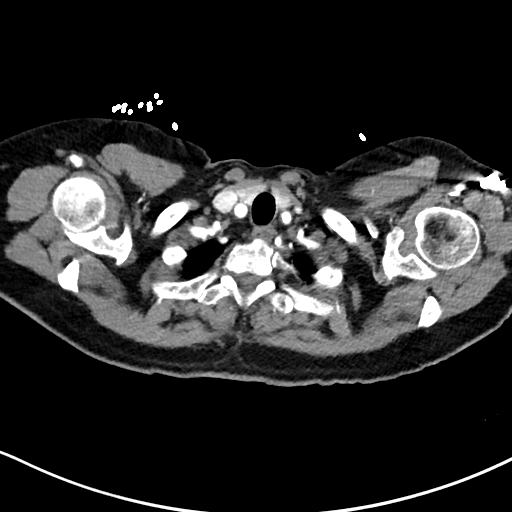
[im 189/204  lung]
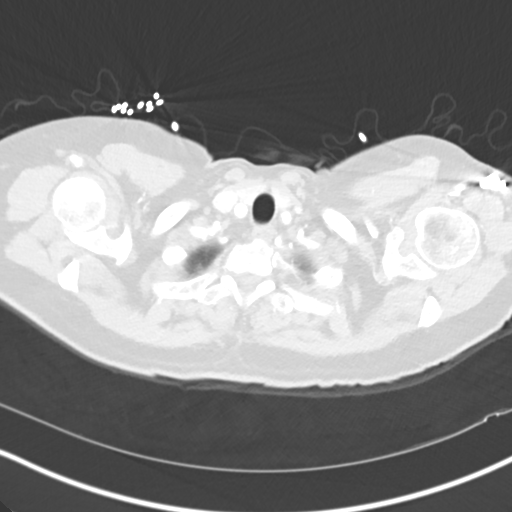

[Series 9: coronals · coronal · 0.71mm/px · 1 of 151 slices shown, 2 images]
[im 76/151  mediastinal]
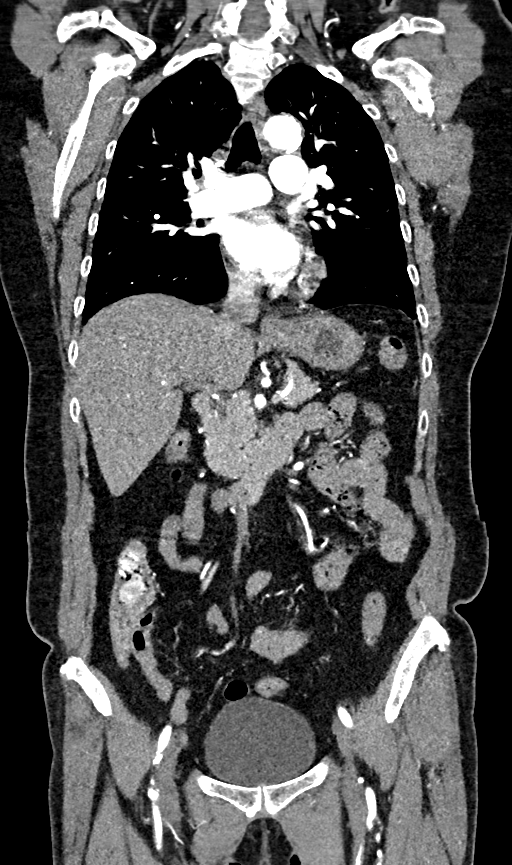
[im 76/151  bone]
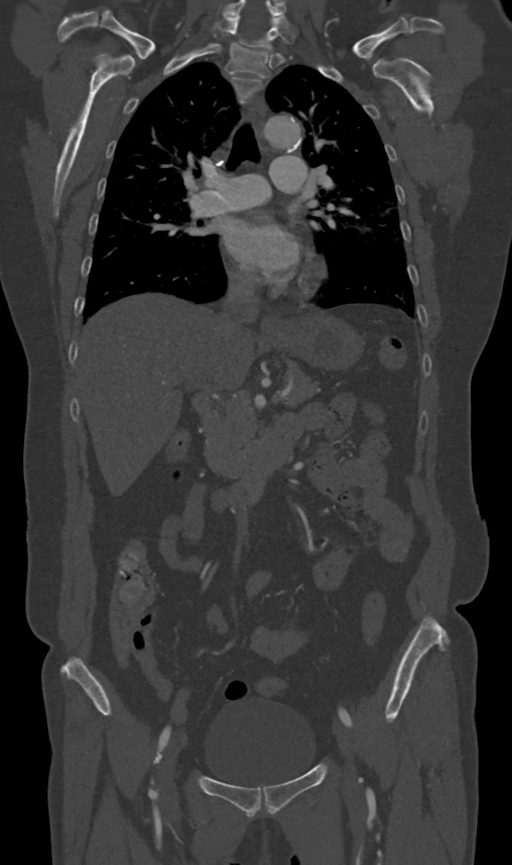

[11 of 36 positions shown; findings below may reference images not displayed]

FINDINGS: CTA CHEST FINDINGS

Cardiovascular: Extensive coronary artery calcifications. No
evidence for aortic dissection or intramural hematoma. Normal
caliber of the thoracic aorta. Great vessels are patent.
Atherosclerotic plaque at the aortic arch is similar to the previous
examination. Main pulmonary arteries are patent without large
pulmonary emboli.

Mediastinum/Nodes: No significant mediastinal or hilar
lymphadenopathy. No axillary lymphadenopathy.

Lungs/Pleura: Trachea and mainstem bronchi are patent. No pleural
effusions. Stable 5 mm pleural-based nodule in the anterior right
middle lobe on sequence 7, image 60. Again noted is a mosaic
attenuation pattern throughout the lungs. In addition, there is
probably atelectasis in the lower lobes. No large areas of airspace
disease or lung consolidation.

Musculoskeletal: No acute bone abnormality.

Review of the MIP images confirms the above findings.

CTA ABDOMEN AND PELVIS FINDINGS

VASCULAR

Aorta: Atherosclerotic disease in the abdominal aorta without
aneurysm or dissection.

Celiac: Mild narrowing at the origin. Celiac trunk and main branch
vessels are patent.

SMA: SMA is patent with mild atherosclerotic disease. No significant
stenosis.

Renals: Mild narrowing at the origin of the right renal artery.
Atherosclerotic plaque at origin of the left renal artery without
significant stenosis.

IMA: IMA is patent.

Inflow: Common, external and internal iliac arteries are patent
bilaterally. Mild disease in the common femoral arteries
bilaterally. The proximal right femoral arteries are patent. There
is severe narrowing in the proximal left SFA. Proximal left deep
femoral arteries are patent.

Veins: No obvious venous abnormality within the limitations of this
arterial phase study.

Review of the MIP images confirms the above findings.

NON-VASCULAR

Hepatobiliary: There appears to be a fold or phrygian cap at the
gallbladder fundus containing a chronic stone. No evidence for
gallbladder distension or inflammation. Normal appearance of the
liver without biliary dilatation.

Pancreas: Unremarkable. No pancreatic ductal dilatation or
surrounding inflammatory changes.

Spleen: Normal in size without focal abnormality.

Adrenals/Urinary Tract: Adrenal glands are normal. Normal appearance
of the kidneys without hydronephrosis or suspicious lesions. Mild
distention of the urinary bladder.

Stomach/Bowel: Stomach is within normal limits. Appendix appears
normal. No evidence of bowel wall thickening, distention, or
inflammatory changes.

Lymphatic: No significant lymph node enlargement in the abdomen or
pelvis.

Reproductive: Uterus and bilateral adnexa are unremarkable.

Other: No ascites.  Negative for free air.

Musculoskeletal: No acute bone abnormality.

Review of the MIP images confirms the above findings.
IMPRESSION: Vascular:

No acute aortic abnormality. Specifically, no evidence for an aortic
aneurysm or dissection.

Extensive coronary artery calcifications.

Stenosis in the proximal left superficial femoral artery.

Nonvascular:

Persistent mosaic attenuation pattern in the lungs. Findings are
nonspecific but could be related to chronic air trapping.

No acute abnormality in the abdomen or pelvis.

Chronic gallstone associated with a phrygian cap. No evidence for
gallbladder inflammation.
